# Patient Record
Sex: Female | Born: 1935
Health system: Southern US, Community
[De-identification: ages and names within clinical notes are randomized; demographics above are authoritative.]

## PROBLEM LIST (undated history)

## (undated) DIAGNOSIS — R112 Nausea with vomiting, unspecified: Secondary | ICD-10-CM

## (undated) DIAGNOSIS — Z5189 Encounter for other specified aftercare: Secondary | ICD-10-CM

## (undated) DIAGNOSIS — S42291P Other displaced fracture of upper end of right humerus, subsequent encounter for fracture with malunion: Secondary | ICD-10-CM

## (undated) DIAGNOSIS — M1990 Unspecified osteoarthritis, unspecified site: Secondary | ICD-10-CM

## (undated) DIAGNOSIS — R05 Cough: Secondary | ICD-10-CM

## (undated) DIAGNOSIS — Z9889 Other specified postprocedural states: Secondary | ICD-10-CM

## (undated) DIAGNOSIS — M8700 Idiopathic aseptic necrosis of unspecified bone: Secondary | ICD-10-CM

## (undated) DIAGNOSIS — E039 Hypothyroidism, unspecified: Secondary | ICD-10-CM

## (undated) DIAGNOSIS — T7840XA Allergy, unspecified, initial encounter: Secondary | ICD-10-CM

## (undated) DIAGNOSIS — R053 Chronic cough: Secondary | ICD-10-CM

## (undated) HISTORY — DX: Encounter for other specified aftercare: Z51.89

## (undated) HISTORY — PX: CATARACT EXTRACTION: SUR2

## (undated) HISTORY — DX: Cough: R05

## (undated) HISTORY — DX: Unspecified osteoarthritis, unspecified site: M19.90

## (undated) HISTORY — DX: Allergy, unspecified, initial encounter: T78.40XA

## (undated) HISTORY — PX: APPENDECTOMY: SHX54

## (undated) HISTORY — DX: Hypothyroidism, unspecified: E03.9

## (undated) HISTORY — PX: TONSILLECTOMY: SUR1361

## (undated) HISTORY — PX: LAPAROSCOPY: SHX197

## (undated) HISTORY — DX: Chronic cough: R05.3

---

## 1973-06-18 HISTORY — PX: ABDOMINAL HYSTERECTOMY: SHX81

## 1996-06-18 HISTORY — PX: OTHER SURGICAL HISTORY: SHX169

## 1999-05-09 ENCOUNTER — Encounter: Payer: Self-pay | Admitting: Obstetrics and Gynecology

## 1999-05-09 ENCOUNTER — Encounter: Admission: RE | Admit: 1999-05-09 | Discharge: 1999-05-09 | Payer: Self-pay | Admitting: Obstetrics and Gynecology

## 1999-08-21 ENCOUNTER — Other Ambulatory Visit: Admission: RE | Admit: 1999-08-21 | Discharge: 1999-08-21 | Payer: Self-pay | Admitting: Obstetrics and Gynecology

## 2000-05-29 ENCOUNTER — Encounter: Payer: Self-pay | Admitting: Internal Medicine

## 2000-05-29 ENCOUNTER — Encounter: Admission: RE | Admit: 2000-05-29 | Discharge: 2000-05-29 | Payer: Self-pay | Admitting: Internal Medicine

## 2001-05-05 ENCOUNTER — Other Ambulatory Visit: Admission: RE | Admit: 2001-05-05 | Discharge: 2001-05-05 | Payer: Self-pay | Admitting: Obstetrics and Gynecology

## 2001-07-30 ENCOUNTER — Encounter: Admission: RE | Admit: 2001-07-30 | Discharge: 2001-07-30 | Payer: Self-pay | Admitting: Internal Medicine

## 2001-07-30 ENCOUNTER — Encounter: Payer: Self-pay | Admitting: Internal Medicine

## 2002-11-10 ENCOUNTER — Encounter: Admission: RE | Admit: 2002-11-10 | Discharge: 2002-11-10 | Payer: Self-pay | Admitting: Unknown Physician Specialty

## 2002-11-10 ENCOUNTER — Encounter: Payer: Self-pay | Admitting: Unknown Physician Specialty

## 2004-05-03 ENCOUNTER — Encounter: Admission: RE | Admit: 2004-05-03 | Discharge: 2004-05-03 | Payer: Self-pay | Admitting: Internal Medicine

## 2004-06-05 ENCOUNTER — Ambulatory Visit: Payer: Self-pay | Admitting: Internal Medicine

## 2005-04-16 ENCOUNTER — Ambulatory Visit: Payer: Self-pay | Admitting: Internal Medicine

## 2005-04-17 ENCOUNTER — Ambulatory Visit: Payer: Self-pay | Admitting: Internal Medicine

## 2005-05-25 ENCOUNTER — Encounter: Admission: RE | Admit: 2005-05-25 | Discharge: 2005-05-25 | Payer: Self-pay | Admitting: Internal Medicine

## 2005-06-18 HISTORY — PX: OOPHORECTOMY: SHX86

## 2005-09-17 ENCOUNTER — Ambulatory Visit: Payer: Self-pay | Admitting: Internal Medicine

## 2006-04-15 ENCOUNTER — Ambulatory Visit: Payer: Self-pay | Admitting: Internal Medicine

## 2006-04-15 LAB — CONVERTED CEMR LAB
Albumin: 4.2 g/dL (ref 3.5–5.2)
BUN: 28 mg/dL — ABNORMAL HIGH (ref 6–23)
Basophils Absolute: 0 10*3/uL (ref 0.0–0.1)
CO2: 28 meq/L (ref 19–32)
Chloride: 106 meq/L (ref 96–112)
Cholesterol: 192 mg/dL (ref 0–200)
Creatinine, Ser: 1.1 mg/dL (ref 0.4–1.2)
Eosinophil percent: 6.9 % — ABNORMAL HIGH (ref 0.0–5.0)
GFR calc non Af Amer: 52 mL/min
Glomerular Filtration Rate, Af Am: 64 mL/min/{1.73_m2}
HCT: 36.2 % (ref 36.0–46.0)
Hemoglobin: 12.3 g/dL (ref 12.0–15.0)
Lymphocytes Relative: 28.9 % (ref 12.0–46.0)
MCHC: 33.9 g/dL (ref 30.0–36.0)
MCV: 93.8 fL (ref 78.0–100.0)
Monocytes Absolute: 0.6 10*3/uL (ref 0.2–0.7)
Neutrophils Relative %: 55.5 % (ref 43.0–77.0)
Sodium: 141 meq/L (ref 135–145)
Total Bilirubin: 0.6 mg/dL (ref 0.3–1.2)
WBC: 6.4 10*3/uL (ref 4.5–10.5)

## 2006-05-16 ENCOUNTER — Ambulatory Visit: Payer: Self-pay | Admitting: Internal Medicine

## 2006-05-18 ENCOUNTER — Encounter: Payer: Self-pay | Admitting: Internal Medicine

## 2006-05-18 LAB — CONVERTED CEMR LAB

## 2006-05-27 ENCOUNTER — Encounter: Admission: RE | Admit: 2006-05-27 | Discharge: 2006-05-27 | Payer: Self-pay | Admitting: Internal Medicine

## 2006-06-18 LAB — HM COLONOSCOPY

## 2006-06-21 ENCOUNTER — Ambulatory Visit: Payer: Self-pay | Admitting: Internal Medicine

## 2006-09-11 ENCOUNTER — Encounter (INDEPENDENT_AMBULATORY_CARE_PROVIDER_SITE_OTHER): Payer: Self-pay | Admitting: *Deleted

## 2006-09-11 ENCOUNTER — Ambulatory Visit (HOSPITAL_COMMUNITY): Admission: RE | Admit: 2006-09-11 | Discharge: 2006-09-11 | Payer: Self-pay | Admitting: Obstetrics and Gynecology

## 2007-01-03 ENCOUNTER — Ambulatory Visit: Payer: Self-pay | Admitting: Internal Medicine

## 2007-01-09 ENCOUNTER — Telehealth (INDEPENDENT_AMBULATORY_CARE_PROVIDER_SITE_OTHER): Payer: Self-pay | Admitting: *Deleted

## 2007-01-13 ENCOUNTER — Encounter: Payer: Self-pay | Admitting: Internal Medicine

## 2007-01-13 ENCOUNTER — Ambulatory Visit: Payer: Self-pay | Admitting: Internal Medicine

## 2007-03-13 ENCOUNTER — Encounter: Payer: Self-pay | Admitting: Internal Medicine

## 2007-03-13 DIAGNOSIS — J309 Allergic rhinitis, unspecified: Secondary | ICD-10-CM | POA: Insufficient documentation

## 2007-03-13 DIAGNOSIS — M810 Age-related osteoporosis without current pathological fracture: Secondary | ICD-10-CM | POA: Insufficient documentation

## 2007-03-13 DIAGNOSIS — E039 Hypothyroidism, unspecified: Secondary | ICD-10-CM

## 2007-04-17 ENCOUNTER — Ambulatory Visit: Payer: Self-pay | Admitting: Internal Medicine

## 2007-04-17 DIAGNOSIS — E785 Hyperlipidemia, unspecified: Secondary | ICD-10-CM

## 2007-04-17 DIAGNOSIS — T887XXA Unspecified adverse effect of drug or medicament, initial encounter: Secondary | ICD-10-CM

## 2007-04-17 LAB — CONVERTED CEMR LAB
AST: 16 units/L (ref 0–37)
Albumin: 4.2 g/dL (ref 3.5–5.2)
BUN: 17 mg/dL (ref 6–23)
Basophils Absolute: 0 10*3/uL (ref 0.0–0.1)
Bilirubin, Direct: 0.2 mg/dL (ref 0.0–0.3)
CO2: 30 meq/L (ref 19–32)
Calcium: 9.5 mg/dL (ref 8.4–10.5)
Chloride: 108 meq/L (ref 96–112)
Cholesterol: 181 mg/dL (ref 0–200)
Creatinine, Ser: 0.9 mg/dL (ref 0.4–1.2)
Eosinophils Absolute: 0.3 10*3/uL (ref 0.0–0.6)
GFR calc non Af Amer: 66 mL/min
HCT: 34.8 % — ABNORMAL LOW (ref 36.0–46.0)
HDL: 56.2 mg/dL (ref >39.0)
Hemoglobin: 12.2 g/dL (ref 12.0–15.0)
LDL Cholesterol: 111 mg/dL — ABNORMAL HIGH (ref 0–99)
MCHC: 34.9 g/dL (ref 30.0–36.0)
MCV: 91.5 fL (ref 78.0–100.0)
Monocytes Absolute: 0.5 10*3/uL (ref 0.2–0.7)
Monocytes Relative: 9.2 % (ref 3.0–11.0)
Neutro Abs: 2.7 10*3/uL (ref 1.4–7.7)
Neutrophils Relative %: 49.5 % (ref 43.0–77.0)
Phosphorus: 4 mg/dL (ref 2.3–4.6)
RDW: 13 % (ref 11.5–14.6)
TSH: 1.01 microintl units/mL (ref 0.35–5.50)

## 2007-06-25 ENCOUNTER — Encounter: Payer: Self-pay | Admitting: Internal Medicine

## 2007-06-25 ENCOUNTER — Encounter: Admission: RE | Admit: 2007-06-25 | Discharge: 2007-06-25 | Payer: Self-pay | Admitting: Internal Medicine

## 2007-07-07 ENCOUNTER — Ambulatory Visit: Payer: Self-pay | Admitting: Family Medicine

## 2007-07-07 ENCOUNTER — Encounter: Payer: Self-pay | Admitting: Internal Medicine

## 2007-07-14 ENCOUNTER — Ambulatory Visit: Payer: Self-pay | Admitting: Internal Medicine

## 2007-07-14 DIAGNOSIS — IMO0002 Reserved for concepts with insufficient information to code with codable children: Secondary | ICD-10-CM

## 2007-07-16 ENCOUNTER — Encounter: Admission: RE | Admit: 2007-07-16 | Discharge: 2007-07-16 | Payer: Self-pay | Admitting: Internal Medicine

## 2007-07-22 ENCOUNTER — Encounter: Payer: Self-pay | Admitting: Internal Medicine

## 2007-07-23 ENCOUNTER — Telehealth: Payer: Self-pay | Admitting: *Deleted

## 2007-08-05 ENCOUNTER — Encounter: Payer: Self-pay | Admitting: Internal Medicine

## 2007-10-08 ENCOUNTER — Ambulatory Visit: Payer: Self-pay | Admitting: Internal Medicine

## 2007-10-08 LAB — CONVERTED CEMR LAB: TSH: 0.13 microintl units/mL — ABNORMAL LOW (ref 0.35–5.50)

## 2007-10-20 ENCOUNTER — Ambulatory Visit: Payer: Self-pay | Admitting: Internal Medicine

## 2007-10-20 LAB — CONVERTED CEMR LAB
Cholesterol, target level: 200 mg/dL
HDL goal, serum: 40 mg/dL
LDL Goal: 160 mg/dL

## 2008-04-14 ENCOUNTER — Ambulatory Visit: Payer: Self-pay | Admitting: Internal Medicine

## 2008-04-14 DIAGNOSIS — G4700 Insomnia, unspecified: Secondary | ICD-10-CM

## 2008-04-14 LAB — CONVERTED CEMR LAB
ALT: 12 units/L (ref 0–35)
Albumin: 4 g/dL (ref 3.5–5.2)
Alkaline Phosphatase: 35 units/L — ABNORMAL LOW (ref 39–117)
Basophils Relative: 0.9 % (ref 0.0–3.0)
Bilirubin, Direct: 0.1 mg/dL (ref 0.0–0.3)
Eosinophils Relative: 7.4 % — ABNORMAL HIGH (ref 0.0–5.0)
HCT: 33.4 % — ABNORMAL LOW (ref 36.0–46.0)
HDL: 59.3 mg/dL (ref >39.0)
MCV: 93.5 fL (ref 78.0–100.0)
Monocytes Absolute: 0.5 10*3/uL (ref 0.1–1.0)
Monocytes Relative: 9.1 % (ref 3.0–12.0)
Platelets: 206 10*3/uL (ref 150–400)
RBC: 3.57 M/uL — ABNORMAL LOW (ref 3.87–5.11)
Total CHOL/HDL Ratio: 3.1
Total Protein: 7 g/dL (ref 6.0–8.3)
WBC: 5.7 10*3/uL (ref 4.5–10.5)

## 2008-04-27 ENCOUNTER — Ambulatory Visit: Payer: Self-pay | Admitting: Internal Medicine

## 2008-07-08 ENCOUNTER — Encounter: Admission: RE | Admit: 2008-07-08 | Discharge: 2008-07-08 | Payer: Self-pay | Admitting: Internal Medicine

## 2008-08-11 ENCOUNTER — Telehealth: Payer: Self-pay | Admitting: Internal Medicine

## 2008-11-01 ENCOUNTER — Telehealth: Payer: Self-pay | Admitting: *Deleted

## 2009-07-27 ENCOUNTER — Encounter: Admission: RE | Admit: 2009-07-27 | Discharge: 2009-07-27 | Payer: Self-pay | Admitting: Internal Medicine

## 2009-07-27 LAB — HM MAMMOGRAPHY

## 2009-10-06 ENCOUNTER — Telehealth: Payer: Self-pay | Admitting: Internal Medicine

## 2009-10-26 ENCOUNTER — Ambulatory Visit: Payer: Self-pay | Admitting: Family Medicine

## 2009-10-26 ENCOUNTER — Encounter: Payer: Self-pay | Admitting: Internal Medicine

## 2009-12-26 ENCOUNTER — Ambulatory Visit: Payer: Self-pay | Admitting: Internal Medicine

## 2009-12-26 LAB — CONVERTED CEMR LAB
ALT: 10 units/L (ref 0–35)
AST: 17 units/L (ref 0–37)
Basophils Relative: 0.8 % (ref 0.0–3.0)
Eosinophils Relative: 7.1 % — ABNORMAL HIGH (ref 0.0–5.0)
GFR calc non Af Amer: 61.43 mL/min (ref >60)
HCT: 34.4 % — ABNORMAL LOW (ref 36.0–46.0)
Hemoglobin: 11.8 g/dL — ABNORMAL LOW (ref 12.0–15.0)
LDL Cholesterol: 125 mg/dL — ABNORMAL HIGH (ref 0–99)
Lymphs Abs: 1.7 10*3/uL (ref 0.7–4.0)
Monocytes Relative: 4.8 % (ref 3.0–12.0)
Neutro Abs: 2.7 10*3/uL (ref 1.4–7.7)
Platelets: 232 10*3/uL (ref 150.0–400.0)
Potassium: 5.6 meq/L — ABNORMAL HIGH (ref 3.5–5.1)
Protein, U semiquant: NEGATIVE
RBC: 3.64 M/uL — ABNORMAL LOW (ref 3.87–5.11)
Sodium: 144 meq/L (ref 135–145)
TSH: 1.52 microintl units/mL (ref 0.35–5.50)
Total Bilirubin: 0.7 mg/dL (ref 0.3–1.2)
Total CHOL/HDL Ratio: 3
Urobilinogen, UA: 0.2
VLDL: 13 mg/dL (ref 0.0–40.0)
WBC: 5 10*3/uL (ref 4.5–10.5)

## 2010-01-02 ENCOUNTER — Ambulatory Visit: Payer: Self-pay | Admitting: Internal Medicine

## 2010-04-06 ENCOUNTER — Ambulatory Visit: Payer: Self-pay | Admitting: Internal Medicine

## 2010-07-03 ENCOUNTER — Encounter: Payer: Self-pay | Admitting: Internal Medicine

## 2010-07-03 ENCOUNTER — Other Ambulatory Visit: Payer: Self-pay | Admitting: Internal Medicine

## 2010-07-03 ENCOUNTER — Ambulatory Visit
Admission: RE | Admit: 2010-07-03 | Discharge: 2010-07-03 | Payer: Self-pay | Source: Home / Self Care | Attending: Internal Medicine | Admitting: Internal Medicine

## 2010-07-03 LAB — LIPID PANEL
Cholesterol: 191 mg/dL (ref 0–200)
HDL: 60.8 mg/dL (ref >39.00)
LDL Cholesterol: 116 mg/dL — ABNORMAL HIGH (ref 0–99)
Total CHOL/HDL Ratio: 3
Triglycerides: 73 mg/dL (ref 0.0–149.0)
VLDL: 14.6 mg/dL (ref 0.0–40.0)

## 2010-07-03 LAB — T3, FREE: T3, Free: 2.2 pg/mL — ABNORMAL LOW (ref 2.3–4.2)

## 2010-07-03 LAB — TSH: TSH: 0.71 u[IU]/mL (ref 0.35–5.50)

## 2010-07-03 LAB — T4, FREE: Free T4: 0.88 ng/dL (ref 0.60–1.60)

## 2010-07-18 NOTE — Miscellaneous (Signed)
Summary: BONE DENSITY  Clinical Lists Changes  Orders: Added new Test order of T-Bone Densitometry (77080) - Signed Added new Test order of T-Lumbar Vertebral Assessment (77082) - Signed 

## 2010-07-18 NOTE — Assessment & Plan Note (Signed)
Summary: flu shot/jss  Nurse Visit   Allergies: 1)  ! Sulfamethoxazole (Sulfamethoxazole)  Orders Added: 1)  Flu Vaccine 68yrs + MEDICARE PATIENTS [Q2039] 2)  Administration Flu vaccine - MCR [G0008]      Flu Vaccine Consent Questions     Do you have a history of severe allergic reactions to this vaccine? no    Any prior history of allergic reactions to egg and/or gelatin? no    Do you have a sensitivity to the preservative Thimersol? no    Do you have a past history of Guillan-Barre Syndrome? no    Do you currently have an acute febrile illness? no    Have you ever had a severe reaction to latex? no    Vaccine information given and explained to patient? yes    Are you currently pregnant? no    Lot Number:[id number]   Exp Date:12/16/2010   Site Given  Left Deltoid IM1

## 2010-07-18 NOTE — Assessment & Plan Note (Signed)
Summary: CPX // RS   Vital Signs:  Patient profile:   75 year old female Height:      66 inches Weight:      149 pounds BMI:     24.14 Temp:     98.2 degrees F oral Pulse rate:   72 / minute Resp:     14 per minute BP sitting:   120 / 70  (left arm)  Vitals Entered By: Willy Eddy, LPN (January 02, 2010 11:04 AM) CC: annual visit for disease managment-bone density results   CC:  annual visit for disease managment-bone density results.  Preventive Screening-Counseling & Management  Alcohol-Tobacco     Smoking Status: never     Passive Smoke Exposure: no  Problems Prior to Update: 1)  Insomnia, Chronic  (ICD-307.42) 2)  Thoracic/lumbosacral Neuritis/radiculitis Unspec  (ICD-724.4) 3)  Back Pain With Radiculopathy  (ICD-729.2) 4)  Advef, Drug/medicinal/biological Subst Nos  (ICD-995.20) 5)  Hyperlipidemia  (ICD-272.4) 6)  Preventive Health Care  (ICD-V70.0) 7)  Family History of Colon Ca 1st Degree Relative <60  (ICD-V16.0) 8)  Allergic Rhinitis  (ICD-477.9) 9)  Osteoporosis  (ICD-733.00) 10)  Hypothyroidism  (ICD-244.9)  Current Problems (verified): 1)  Insomnia, Chronic  (ICD-307.42) 2)  Thoracic/lumbosacral Neuritis/radiculitis Unspec  (ICD-724.4) 3)  Back Pain With Radiculopathy  (ICD-729.2) 4)  Advef, Drug/medicinal/biological Subst Nos  (ICD-995.20) 5)  Hyperlipidemia  (ICD-272.4) 6)  Preventive Health Care  (ICD-V70.0) 7)  Family History of Colon Ca 1st Degree Relative <60  (ICD-V16.0) 8)  Allergic Rhinitis  (ICD-477.9) 9)  Osteoporosis  (ICD-733.00) 10)  Hypothyroidism  (ICD-244.9)  Medications Prior to Update: 1)  Synthroid 75 Mcg  Tabs (Levothyroxine Sodium) .... Take 1 Tablet By Mouth Once A Day 2)  Multivitamins   Tabs (Multiple Vitamin) .... Once Daily 3)  Calcium-Vitamin D 500-125 Mg-Unit  Tabs (Calcium-Vitamin D) .... Once Daily 4)  Boniva 150 Mg  Tabs (Ibandronate Sodium) .... Monthly 5)  Lunesta 3 Mg  Tabs (Eszopiclone) .... Bedtime As  Needed 6)  Seroquel 25 Mg Tabs (Quetiapine Fumarate) .... One By Mouth Q Hs  Current Medications (verified): 1)  Synthroid 75 Mcg  Tabs (Levothyroxine Sodium) .... Take 1 Tablet By Mouth Once A Day 2)  Multivitamins   Tabs (Multiple Vitamin) .... Once Daily 3)  Calcarb 600/d 600-400 Mg-Unit Tabs (Calcium Carbonate-Vitamin D) .... One By Mouth Two Times A Day One Two At Bed Time 4)  Zolpidem Tartrate 12.5 Mg Cr-Tabs (Zolpidem Tartrate) .... One By Mouth Daily 5)  Vitamin D 1000 Unit Tabs (Cholecalciferol) .... One At Bedtime With The Calcium  Allergies (verified): 1)  ! Sulfamethoxazole (Sulfamethoxazole)  Past History:  Family History: Last updated: 2007/05/07 father died from age at 83 ( parkinsons) mother had alzhiemers Family History Osteoporosis Family History of Colon CA 1st degree relative <60  Social History: Last updated: 07-May-2007 Married Never Smoked Alcohol use-no Drug use-no Regular exercise-yes  Risk Factors: Caffeine Use: 1 (May 07, 2007) Exercise: yes (May 07, 2007)  Risk Factors: Smoking Status: never (01/02/2010) Passive Smoke Exposure: no (01/02/2010)  Past medical, surgical, family and social histories (including risk factors) reviewed, and no changes noted (except as noted below).  Past Medical History: Reviewed history from 05-07-07 and no changes required. Hypothyroidism Osteoporosis Allergic rhinitis Hyperlipidemia  Past Surgical History: Reviewed history from 05-07-2007 and no changes required. Hysterectomy, TAH- laparscopy with  Oophorectomy due to ovarain cysts Appendectomy Tonsillectomy  Family History: Reviewed history from 05/07/2007 and no changes required. father died from age at  80 ( parkinsons) mother had alzhiemers Family History Osteoporosis Family History of Colon CA 1st degree relative <60  Social History: Reviewed history from 04/17/2007 and no changes required. Married Never Smoked Alcohol use-no Drug  use-no Regular exercise-yes  Review of Systems  The patient denies anorexia, fever, weight loss, weight gain, vision loss, decreased hearing, hoarseness, chest pain, syncope, dyspnea on exertion, peripheral edema, prolonged cough, headaches, hemoptysis, abdominal pain, melena, hematochezia, severe indigestion/heartburn, hematuria, incontinence, genital sores, muscle weakness, suspicious skin lesions, transient blindness, difficulty walking, depression, unusual weight change, abnormal bleeding, enlarged lymph nodes, angioedema, and breast masses.    Physical Exam  General:  Well-developed,well-nourished,in no acute distress; alert,appropriate and cooperative throughout examination Head:  Normocephalic and atraumatic without obvious abnormalities. No apparent alopecia or balding. Eyes:  No corneal or conjunctival inflammation noted. EOMI. Perrla. Funduscopic exam benign, without hemorrhages, exudates or papilledema. Vision grossly normal. Nose:  External nasal examination shows no deformity or inflammation. Nasal mucosa are pink and moist without lesions or exudates. Mouth:  Oral mucosa and oropharynx without lesions or exudates.  Teeth in good repair. Neck:  No deformities, masses, or tenderness noted. Lungs:  Normal respiratory effort, chest expands symmetrically. Lungs are clear to auscultation, no crackles or wheezes. Heart:  normal rate, regular rhythm, and no gallop.     Impression & Recommendations:  Problem # 1:  OSTEOPOROSIS (ICD-733.00) hold the boniva The following medications were removed from the medication list:    Boniva 150 Mg Tabs (Ibandronate sodium) ..... Monthly  Discussed medication use, applications of heat or ice, and exercises.   Problem # 2:  PREVENTIVE HEALTH CARE (ICD-V70.0) one by mouth BID Mammogram: ASSESSMENT: Negative - BI-RADS 1^MM DIGITAL SCREENING (07/27/2009) Pap smear: normal (07/15/2009) Colonoscopy: done (06/18/2006) Bone Density: abnormal  (10/26/2009) Td Booster: historical (06/18/2005)   Flu Vax: Fluvax Non-MCR (04/17/2007)   Pneumovax: Pneumovax (Medicare) (04/14/2008) Chol: 193 (12/26/2009)   HDL: 55.40 (12/26/2009)   LDL: 125 (12/26/2009)   TG: 65.0 (12/26/2009) TSH: 1.52 (12/26/2009)   Next mammogram due:: 07/2010 (01/02/2010)  Discussed using sunscreen, use of alcohol, drug use, self breast exam, routine dental care, routine eye care, schedule for GYN exam, routine physical exam, seat belts, multiple vitamins, osteoporosis prevention, adequate calcium intake in diet, recommendations for immunizations, mammograms and Pap smears.  Discussed exercise and checking cholesterol.  Discussed gun safety, safe sex, and contraception.  Problem # 3:  HYPOTHYROIDISM (ICD-244.9)  Her updated medication list for this problem includes:    Synthroid 75 Mcg Tabs (Levothyroxine sodium) .Marland Kitchen... Take 1 tablet by mouth once a day  Labs Reviewed: TSH: 1.52 (12/26/2009)    Chol: 193 (12/26/2009)   HDL: 55.40 (12/26/2009)   LDL: 125 (12/26/2009)   TG: 65.0 (12/26/2009)  Problem # 4:  INSOMNIA, CHRONIC (ICD-307.42) the seroquil did not help  Complete Medication List: 1)  Synthroid 75 Mcg Tabs (Levothyroxine sodium) .... Take 1 tablet by mouth once a day 2)  Multivitamins Tabs (Multiple vitamin) .... Once daily 3)  Calcarb 600/d 600-400 Mg-unit Tabs (Calcium carbonate-vitamin d) .... One by mouth two times a day one two at bed time 4)  Zolpidem Tartrate 12.5 Mg Cr-tabs (Zolpidem tartrate) .... One by mouth daily 5)  Vitamin D 1000 Unit Tabs (Cholecalciferol) .... One at bedtime with the calcium  Other Orders: Venipuncture (16109) TLB-Potassium (K+) (60454-U)  Patient Instructions: 1)  Please schedule a follow-up appointment in 6 months. Prescriptions: ZOLPIDEM TARTRATE 12.5 MG CR-TABS (ZOLPIDEM TARTRATE) one by mouth daily  #30 x 2  Entered and Authorized by:   Stacie Glaze MD   Signed by:   Stacie Glaze MD on 01/02/2010   Method  used:   Print then Give to Patient   RxID:   [id number] SEROQUEL 25 MG TABS (QUETIAPINE FUMARATE) one by mouth q HS  #90 x 3   Entered by:   Willy Eddy, LPN   Authorized by:   Stacie Glaze MD   Signed by:   Willy Eddy, LPN on 14/78/2956   Method used:   Print then Give to Patient   RxID:   [id number] LUNESTA 3 MG  TABS (ESZOPICLONE) bedtime as needed  #90 x 3   Entered by:   Willy Eddy, LPN   Authorized by:   Stacie Glaze MD   Signed by:   Willy Eddy, LPN on 28/41/3244   Method used:   Print then Give to Patient   RxID:   [id number] SYNTHROID 75 MCG  TABS (LEVOTHYROXINE SODIUM) Take 1 tablet by mouth once a day  #90 x 3   Entered by:   Willy Eddy, LPN   Authorized by:   Stacie Glaze MD   Signed by:   Willy Eddy, LPN on 42/59/5638   Method used:   Print then Give to Patient   RxID:   [id number]    Preventive Care Screening  Mammogram:    Date:  07/15/2009    Next Due:  07/2010    Results:  normal   Pap Smear:    Date:  07/15/2009    Next Due:  07/2010    Results:  normal   Bone Density:    Date:  10/26/2009    Results:  abnormal std dev    Immunization History:  Tetanus/Td Immunization History:    Tetanus/Td:  historical (03/18/2009)   Prevention & Chronic Care Immunizations   Influenza vaccine: Fluvax Non-MCR  (04/17/2007)   Influenza vaccine due: 02/17/2011    Tetanus booster: 03/18/2009: Historical   Tetanus booster due: 06/19/2015    Pneumococcal vaccine: Pneumovax (Medicare)  (04/14/2008)   Pneumococcal vaccine due: 04/14/2013    H. zoster vaccine: 04/27/2008: Zostavax  Colorectal Screening   Hemoccult: Not documented    Colonoscopy: done  (06/18/2006)  Other Screening   Pap smear: normal  (07/15/2009)   Pap smear due: 07/2010    Mammogram: ASSESSMENT: Negative - BI-RADS 1^MM DIGITAL SCREENING  (07/27/2009)   Mammogram due: 07/2010    DXA bone density scan: abnormal   (10/26/2009)   Smoking status: never  (01/02/2010)  Lipids   Total Cholesterol: 193  (12/26/2009)   LDL: 125  (12/26/2009)   LDL Direct: Not documented   HDL: 55.40  (12/26/2009)   Triglycerides: 65.0  (12/26/2009)    SGOT (AST): 17  (12/26/2009)   SGPT (ALT): 10  (12/26/2009)   Alkaline phosphatase: 38  (12/26/2009)   Total bilirubin: 0.7  (12/26/2009)  Self-Management Support :    Lipid self-management support: Not documented

## 2010-07-18 NOTE — Progress Notes (Signed)
Summary: REQ FOR MED RX (SYNTHROID)  Phone Note Refill Request Message from:  Patient on October 06, 2009 10:17 AM  Refills Requested: Medication #1:  SYNTHROID 75 MCG  TABS Take 1 tablet by mouth once a day   Notes: Pt adv that Rx can be sent to [facility] (for a year, 90-day increments). Initial call taken by: Debbra Riding,  October 06, 2009 10:18 AM

## 2010-07-20 ENCOUNTER — Other Ambulatory Visit: Payer: Self-pay | Admitting: Internal Medicine

## 2010-07-20 DIAGNOSIS — Z1239 Encounter for other screening for malignant neoplasm of breast: Secondary | ICD-10-CM

## 2010-07-20 NOTE — Assessment & Plan Note (Signed)
Summary: 6 monrth rov/njr   Vital Signs:  Patient profile:   75 year old female Weight:      146 pounds BMI:     23.65 Temp:     98.5 degrees F oral Pulse rate:   70 / minute Resp:     14 per minute BP sitting:   120 / 80  (left arm) Cuff size:   regular  Vitals Entered By: Azucena Freed,  MA Student CC: 6 mon rov---doing ok , Lipid Management Is Patient Diabetic? No   Primary Care Provider:  Stacie Glaze MD  CC:  6 mon rov---doing ok  and Lipid Management.  History of Present Illness: follow up weight stable the pt's husband has dementia and the care issues are becoming burdensome   Lipid Management History:      Positive NCEP/ATP III risk factors include female age 87 years old or older.  Negative NCEP/ATP III risk factors include no family history for ischemic heart disease, non-tobacco-user status, non-hypertensive, no ASHD (atherosclerotic heart disease), no prior stroke/TIA, no peripheral vascular disease, and no history of aortic aneurysm.     Problems Prior to Update: 1)  Insomnia, Chronic  (ICD-307.42) 2)  Thoracic/lumbosacral Neuritis/radiculitis Unspec  (ICD-724.4) 3)  Back Pain With Radiculopathy  (ICD-729.2) 4)  Advef, Drug/medicinal/biological Subst Nos  (ICD-995.20) 5)  Hyperlipidemia  (ICD-272.4) 6)  Preventive Health Care  (ICD-V70.0) 7)  Family History of Colon Ca 1st Degree Relative <60  (ICD-V16.0) 8)  Allergic Rhinitis  (ICD-477.9) 9)  Osteoporosis  (ICD-733.00) 10)  Hypothyroidism  (ICD-244.9)  Medications Prior to Update: 1)  Synthroid 75 Mcg  Tabs (Levothyroxine Sodium) .... Take 1 Tablet By Mouth Once A Day 2)  Multivitamins   Tabs (Multiple Vitamin) .... Once Daily 3)  Calcarb 600/d 600-400 Mg-Unit Tabs (Calcium Carbonate-Vitamin D) .... One By Mouth Two Times A Day One Two At Bed Time 4)  Zolpidem Tartrate 10 Mg Tabs (Zolpidem Tartrate) .Marland Kitchen.. 1 Once Daily 5)  Vitamin D 1000 Unit Tabs (Cholecalciferol) .... One At Bedtime With The  Calcium  Current Medications (verified): 1)  Synthroid 75 Mcg  Tabs (Levothyroxine Sodium) .... Take 1 Tablet By Mouth Once A Day 2)  Multivitamins   Tabs (Multiple Vitamin) .... Once Daily 3)  Calcarb 600/d 600-400 Mg-Unit Tabs (Calcium Carbonate-Vitamin D) .... One By Mouth Two Times A Day One Two At Bed Time 4)  Lunesta 3 Mg Tabs (Eszopiclone) .Marland Kitchen.. 1 Qh As As Needed Sleep 5)  Vitamin D 1000 Unit Tabs (Cholecalciferol) .... One At Bedtime With The Calcium  Allergies (verified): 1)  ! Sulfamethoxazole (Sulfamethoxazole)  Past History:  Family History: Last updated: May 12, 2007 father died from age at 68 ( parkinsons) mother had alzhiemers Family History Osteoporosis Family History of Colon CA 1st degree relative <60  Social History: Last updated: 05-12-2007 Married Never Smoked Alcohol use-no Drug use-no Regular exercise-yes  Risk Factors: Caffeine Use: 1 (May 12, 2007) Exercise: yes (May 12, 2007)  Risk Factors: Smoking Status: never (01/02/2010) Passive Smoke Exposure: no (01/02/2010)  Past medical, surgical, family and social histories (including risk factors) reviewed, and no changes noted (except as noted below).  Past Medical History: Reviewed history from 12-May-2007 and no changes required. Hypothyroidism Osteoporosis Allergic rhinitis Hyperlipidemia  Past Surgical History: Reviewed history from 2007/05/12 and no changes required. Hysterectomy, TAH- laparscopy with  Oophorectomy due to ovarain cysts Appendectomy Tonsillectomy  Family History: Reviewed history from 05/12/2007 and no changes required. father died from age at 75 ( parkinsons) mother had  alzhiemers Family History Osteoporosis Family History of Colon CA 1st degree relative <60  Social History: Reviewed history from 04/17/2007 and no changes required. Married Never Smoked Alcohol use-no Drug use-no Regular exercise-yes  Review of Systems  The patient denies anorexia, fever, weight  loss, weight gain, vision loss, decreased hearing, hoarseness, chest pain, syncope, dyspnea on exertion, peripheral edema, prolonged cough, headaches, hemoptysis, abdominal pain, melena, hematochezia, severe indigestion/heartburn, hematuria, incontinence, genital sores, muscle weakness, suspicious skin lesions, transient blindness, difficulty walking, depression, unusual weight change, abnormal bleeding, enlarged lymph nodes, angioedema, and breast masses.    Physical Exam  General:  Well-developed,well-nourished,in no acute distress; alert,appropriate and cooperative throughout examination Head:  Normocephalic and atraumatic without obvious abnormalities. No apparent alopecia or balding. Eyes:  pupils equal and pupils round.   Ears:  R ear normal and L ear normal.   Nose:  no external deformity and no nasal discharge.   Neck:  No deformities, masses, or tenderness noted. Lungs:  normal respiratory effort and no wheezes.   Heart:  normal rate and regular rhythm.   Abdomen:  soft, non-tender, and normal bowel sounds.      Impression & Recommendations:  Problem # 1:  HYPERLIPIDEMIA (ICD-272.4)  Labs Reviewed: SGOT: 17 (12/26/2009)   SGPT: 10 (12/26/2009)  Lipid Goals: Chol Goal: 200 (10/20/2007)   HDL Goal: 40 (10/20/2007)   LDL Goal: 160 (10/20/2007)   TG Goal: 150 (10/20/2007)  Prior 10 Yr Risk Heart Disease: 8 % (10/20/2007)   HDL:55.40 (12/26/2009), 59.3 (04/14/2008)  LDL:125 (12/26/2009), 109 (04/14/2008)  Chol:193 (12/26/2009), 181 (04/14/2008)  Trig:65.0 (12/26/2009), 66 (04/14/2008)  Orders: TLB-Lipid Panel (80061-LIPID)  Problem # 2:  HYPOTHYROIDISM (ICD-244.9) Assessment: New  Her updated medication list for this problem includes:    Synthroid 75 Mcg Tabs (Levothyroxine sodium) .Marland Kitchen... Take 1 tablet by mouth once a day  Labs Reviewed: TSH: 1.52 (12/26/2009)    Chol: 193 (12/26/2009)   HDL: 55.40 (12/26/2009)   LDL: 125 (12/26/2009)   TG: 65.0  (12/26/2009)  Orders: TLB-T3, Free (Triiodothyronine) (84481-T3FREE) TLB-T4 (Thyrox), Free [phone]) TLB-TSH (Thyroid Stimulating Hormone) (84443-TSH)  Problem # 3:  OSTEOPOROSIS (ICD-733.00) Assessment: Unchanged  Discussed medication use, applications of heat or ice, and exercises.   Orders: Venipuncture (40981) T-Vitamin D (25-Hydroxy) (19147-82956)  Problem # 4:  THORACIC/LUMBOSACRAL NEURITIS/RADICULITIS UNSPEC (ICD-724.4) Assessment: Unchanged  Discussed use of moist heat or ice, modified activities, medications, and stretching/strengthening exercises. Back care instructions given. To be seen in 2 weeks if no improvement; sooner if worsening of symptoms.   Complete Medication List: 1)  Synthroid 75 Mcg Tabs (Levothyroxine sodium) .... Take 1 tablet by mouth once a day 2)  Multivitamins Tabs (Multiple vitamin) .... Once daily 3)  Calcarb 600/d 600-400 Mg-unit Tabs (Calcium carbonate-vitamin d) .... One by mouth two times a day one two at bed time 4)  Lunesta 3 Mg Tabs (Eszopiclone) .Marland Kitchen.. 1 qh as as needed sleep 5)  Vitamin D 1000 Unit Tabs (Cholecalciferol) .... One at bedtime with the calcium  Lipid Assessment/Plan:      Based on NCEP/ATP III, the patient's risk factor category is "0-1 risk factors".  The patient's lipid goals are as follows: Total cholesterol goal is 200; LDL cholesterol goal is 160; HDL cholesterol goal is 40; Triglyceride goal is 150.  Her LDL cholesterol goal has been met.    Patient Instructions: 1)  Please schedule a follow-up appointment in 6 months. medicare welness exam Prescriptions: LUNESTA 3 MG TABS (ESZOPICLONE) 1 qh as as needed sleep  #  90 x 3   Entered and Authorized by:   Stacie Glaze MD   Signed by:   Stacie Glaze MD on 07/03/2010   Method used:   Print then Give to Patient   RxID:   [id number] SYNTHROID 75 MCG  TABS (LEVOTHYROXINE SODIUM) Take 1 tablet by mouth once a day  #90 x 3   Entered and Authorized by:   Stacie Glaze  MD   Signed by:   Stacie Glaze MD on 07/03/2010   Method used:   Electronically to        [facility]* (mail-order)       [address] [place], [place]  [address]       Ph: [phone]       Fax: [phone]   RxID:   [id number] LUNESTA 3 MG TABS (ESZOPICLONE) 1 qh as as needed sleep  #30 x 11   Entered and Authorized by:   Stacie Glaze MD   Signed by:   Stacie Glaze MD on 07/03/2010   Method used:   Print then Give to Patient   RxID:   [id number]    Orders Added: 1)  Est. Patient Level IV [95188] 2)  Venipuncture [41660] 3)  T-Vitamin D (25-Hydroxy) [63016-01093] 4)  TLB-T3, Free (Triiodothyronine) [23557-D2KGUR] 5)  TLB-T4 (Thyrox), Free [42706-CB7S] 6)  TLB-TSH (Thyroid Stimulating Hormone) [84443-TSH] 7)  TLB-Lipid Panel [80061-LIPID]  Appended Document: Orders Update    Clinical Lists Changes  Orders: Added new Service order of Specimen Handling (28315) - Signed

## 2010-08-02 ENCOUNTER — Ambulatory Visit
Admission: RE | Admit: 2010-08-02 | Discharge: 2010-08-02 | Disposition: A | Discharge status: Home / Self Care | Payer: PRIVATE HEALTH INSURANCE | Source: Ambulatory Visit | Attending: Internal Medicine | Admitting: Internal Medicine

## 2010-08-02 DIAGNOSIS — Z1239 Encounter for other screening for malignant neoplasm of breast: Secondary | ICD-10-CM

## 2010-11-03 NOTE — Op Note (Signed)
Nancy, [name] NO.:  [id number]   MEDICAL RECORD NO.:  [id number]          PATIENT TYPE:  AMB   LOCATION:  SDC                           FACILITY:  [facility]   PHYSICIAN:  Carrington Clamp, M.D. DATE OF BIRTH:  December 08, 1935   DATE OF PROCEDURE:  09/11/2006  DATE OF DISCHARGE:                               OPERATIVE REPORT   PREOPERATIVE DIAGNOSIS:  Right ovarian cyst and hydrosalpinx.  The  patient desires ovaries out.   POSTOPERATIVE DIAGNOSIS:  Right ovarian cyst and hydrosalpinx.  The  patient desires ovaries out.  Adhesions of left ovary to the sigmoid  bowel.   SURGEON:  Carrington Clamp, M.D.   ASSISTANT:  Luvenia Redden, M.D.   ANESTHESIA:  General endotracheal.   SPECIMENS:  Right and left ovary and tube.   ESTIMATED BLOOD LOSS:  Minimal.   IV FLUIDS:  1700 mL.   URINE OUTPUT:  Was not measured.   COMPLICATIONS:  Were none.   FINDINGS:  Were small clear cyst on the right ovary.  There also  appeared to be 2 cm cyst inside the ovary, too, this was not inspected  as the entire ovary was handed off intact.  Left ovary appeared normal  except there was adhesed to the sigmoid bowel epiploica.  There appeared  to be appendiceal stump on the liver edge and the cul-de-sac was  otherwise normal.  The ureters were seen before and after removal of the  ovaries.   MEDICATIONS:  None.   COUNTS:  Correct x3.   TECHNIQUE:  After adequate general anesthesia was achieved, the patient  was prepped, draped in sterile fashion in dorsal lithotomy position.  A  sponge stick was placed in vagina and the bladder was emptied with a  catheter during the procedure.  A 2 cm infraumbilical incision was made  with the scalpel and the Veress needle passed into the abdomen without  aspiration of bowel contents or blood.  The abdomen was insufflated.  The 10 mm trocar placed without complication.  The two 5 mm trocars were  placed lateral to the respective the inferior  epigastrics.  This was  done under direct visualization of the camera.  Probes were entered and  the above findings noted.  The sigmoid was adhesed to the left anterior  abdominal wall as well as to the left ovary.  Sharp dissection with the  scissors carefully carried out in the filmy adhesions to remove the  bowel carefully and safely off of the ovary.  Good hemostasis was  achieved as well as the bowel being out of the field of dissection  directly.  Once the ovary was freed with the dissection, the  infundibulopelvic ligament and the ureter on the left could be seen.  The ureter was well under the field of dissection and infundibulopelvic  ligament was cauterized with the triple polar cautery.  Cautery  continued inferiorly medial to include the rest of the mesosalpinx and  the peritoneum as well as the tube.  The tube had been adhesed to the  anterior abdominal wall and had been removed with  sharp lysis of  adhesion with the scissors.  Once the ovary was removed, the ureters  were reinspected and found to be well out of the field of dissection.  Attention was then turned, specimen placed in the cul-de-sac and  attention was turned to the right side.  The infundibulopelvic ligament  was isolated with the triple polar cautery well away from the right  ureter.  The cautery continued along the ovary until the adhesions in  peritoneum had been removed from it as well.  Again the ureter as well  out of the field of dissection was identified both before and after the  dissection.   5 mm scope was placed and the right ovary was placed in the endobag and  removed through the 10 mm trocar site.  This was sent separately from  the left ovary which was also removed in the same way.  The tubes had  been removed as well.  Reinspection irrigation revealed hemostasis and  again the ureters out of the field of dissection.  The bowel had been  out of the field of dissection as well.  After  identifying the  appendiceal stump, all instruments were withdrawn from the abdomen and  the abdomen desufflated.  The attention was then turned to the abdomen  where a small 1 cm dark raised mole was seen on the left lateral side of  the abdomen.  A scalpel was used to make an ellipse around this mole and  the entire thing was removed.  Four stitches of 2-0 Vicryl Rapide were  placed and hemostasis was achieved.  A through-and-through stitch was  placed in each of the 5 mm trocar skin sites.  The fascia of the 10 mm  trocar site was closed with figure-of-eight stitch of 2-0 Vicryl.  The  skin was closed with Dermabond in all incisions.  The instruments  withdrawn from the abdomen and the abdomen desufflated.  All instruments  were withdrawn from the vagina and the patient tolerated procedure well  and she was returned to recovery in stable condition.      Carrington Clamp, M.D.  Electronically Signed     MH/MEDQ  D:  09/11/2006  T:  09/11/2006  Job:  [id number]

## 2011-01-02 ENCOUNTER — Encounter: Payer: Self-pay | Admitting: Internal Medicine

## 2011-01-05 ENCOUNTER — Encounter: Payer: Self-pay | Admitting: Internal Medicine

## 2011-01-05 ENCOUNTER — Ambulatory Visit (INDEPENDENT_AMBULATORY_CARE_PROVIDER_SITE_OTHER): Payer: Medicare Other | Admitting: Internal Medicine

## 2011-01-05 DIAGNOSIS — E785 Hyperlipidemia, unspecified: Secondary | ICD-10-CM

## 2011-01-05 DIAGNOSIS — M810 Age-related osteoporosis without current pathological fracture: Secondary | ICD-10-CM

## 2011-01-05 DIAGNOSIS — T887XXA Unspecified adverse effect of drug or medicament, initial encounter: Secondary | ICD-10-CM

## 2011-01-05 DIAGNOSIS — Z0000 Encounter for general adult medical examination without abnormal findings: Secondary | ICD-10-CM

## 2011-01-05 LAB — POCT URINALYSIS DIPSTICK
Bilirubin, UA: NEGATIVE
Blood, UA: NEGATIVE
Glucose, UA: NEGATIVE
Ketones, UA: NEGATIVE
Nitrite, UA: NEGATIVE
Spec Grav, UA: 1.03
pH, UA: 5.5

## 2011-01-05 LAB — HEPATIC FUNCTION PANEL
ALT: 13 U/L (ref 0–35)
Albumin: 4.7 g/dL (ref 3.5–5.2)
Alkaline Phosphatase: 41 U/L (ref 39–117)
Bilirubin, Direct: 0.1 mg/dL (ref 0.0–0.3)
Total Bilirubin: 0.7 mg/dL (ref 0.3–1.2)

## 2011-01-05 LAB — LIPID PANEL
HDL: 63.2 mg/dL (ref >39.00)
Total CHOL/HDL Ratio: 3
Triglycerides: 70 mg/dL (ref 0.0–149.0)

## 2011-01-05 LAB — CBC WITH DIFFERENTIAL/PLATELET
Eosinophils Relative: 2.4 % (ref 0.0–5.0)
HCT: 33.8 % — ABNORMAL LOW (ref 36.0–46.0)
Hemoglobin: 11.5 g/dL — ABNORMAL LOW (ref 12.0–15.0)
Lymphs Abs: 1.8 10*3/uL (ref 0.7–4.0)
MCV: 93.2 fl (ref 78.0–100.0)
Monocytes Absolute: 0.4 10*3/uL (ref 0.1–1.0)
Monocytes Relative: 6.5 % (ref 3.0–12.0)
Neutro Abs: 4.3 10*3/uL (ref 1.4–7.7)
WBC: 6.6 10*3/uL (ref 4.5–10.5)

## 2011-01-05 LAB — BASIC METABOLIC PANEL
CO2: 27 mEq/L (ref 19–32)
Calcium: 9 mg/dL (ref 8.4–10.5)
Creatinine, Ser: 0.9 mg/dL (ref 0.4–1.2)
Sodium: 144 mEq/L (ref 135–145)

## 2011-01-05 NOTE — Progress Notes (Signed)
Subjective:    Nancy [name] is a 75 y.o. female who presents for Medicare Annual/Subsequent preventive examination. Patient also is followed for mild hyperlipidemia osteoporosis with vitamin D deficiency appropriate screening labs will be added today for those issues Preventive Screening-Counseling & Management  Tobacco History  Smoking status  . Never Smoker   Smokeless tobacco  . Not on file     Problems Prior to Visit 1.   Current Problems (verified) Patient Active Problem List  Diagnoses  . HYPOTHYROIDISM  . HYPERLIPIDEMIA  . INSOMNIA, CHRONIC  . ALLERGIC RHINITIS  . THORACIC/LUMBOSACRAL NEURITIS/RADICULITIS UNSPEC  . BACK PAIN WITH RADICULOPATHY  . OSTEOPOROSIS  . ADVEF, DRUG/MEDICINAL/BIOLOGICAL SUBST NOS    Medications Prior to Visit Current Outpatient Prescriptions on File Prior to Visit  Medication Sig Dispense Refill  . Calcium Carbonate-Vitamin D (CALCARB 600/D) 600-400 MG-UNIT per tablet Take 1 tablet by mouth daily.        . Cholecalciferol (VITAMIN D3) 1000 UNITS CAPS Take 1 capsule by mouth daily.        . Eszopiclone 3 MG TABS Take 3 mg by mouth at bedtime. Take immediately before bedtime       . levothyroxine (SYNTHROID, LEVOTHROID) 75 MCG tablet Take 75 mcg by mouth daily.        . Multiple Vitamin (MULTIVITAMIN) tablet Take 1 tablet by mouth daily.          Current Medications (verified) Current Outpatient Prescriptions  Medication Sig Dispense Refill  . Calcium Carbonate-Vitamin D (CALCARB 600/D) 600-400 MG-UNIT per tablet Take 1 tablet by mouth daily.        . Cholecalciferol (VITAMIN D3) 1000 UNITS CAPS Take 1 capsule by mouth daily.        . Eszopiclone 3 MG TABS Take 3 mg by mouth at bedtime. Take immediately before bedtime       . levothyroxine (SYNTHROID, LEVOTHROID) 75 MCG tablet Take 75 mcg by mouth daily.        . Multiple Vitamin (MULTIVITAMIN) tablet Take 1 tablet by mouth daily.           Allergies (verified) Sulfamethoxazole    PAST HISTORY  Family History Family History  Problem Relation Age of Onset  . Dementia Mother   . Parkinsonism Father   . Osteoporosis    . Colon cancer      Social History History  Substance Use Topics  . Smoking status: Never Smoker   . Smokeless tobacco: Not on file  . Alcohol Use: No     Are there smokers in your home (other than you)? No  Risk Factors Current exercise habits: Home exercise routine includes walking 2 hrs per day.  Dietary issues discussed:   none  Cardiac risk factors: advanced age (older than 90 for men, 49 for women).  Depression Screen (Note: if answer to either of the following is "Yes", a more complete depression screening is indicated)   Over the past two weeks, have you felt down, depressed or hopeless? No  Over the past two weeks, have you felt little interest or pleasure in doing things? No  Have you lost interest or pleasure in daily life? No  Do you often feel hopeless? No  Do you cry easily over simple problems? No  Activities of Daily Living In your present state of health, do you have any difficulty performing the following activities?:  Driving? No Managing money?  No Feeding yourself? No Getting from bed to chair? No Climbing a flight of  stairs? No Preparing food and eating?: No Bathing or showering? No Getting dressed: No Getting to the toilet? No Using the toilet:No Moving around from place to place: No In the past year have you fallen or had a near fall?:No   Are you sexually active?  No  Do you have more than one partner?  No  Hearing Difficulties: No Do you often ask people to speak up or repeat themselves? No Do you experience ringing or noises in your ears? No Do you have difficulty understanding soft or whispered voices? No   Do you feel that you have a problem with memory? No  Do you often misplace items? No  Do you feel safe at home?  No  Cognitive Testing  Alert? Yes  Normal Appearance?Yes  Oriented  to person? Yes  Place? Yes   Time? Yes  Recall of three objects?  Yes  Can perform simple calculations? Yes  Displays appropriate judgment?Yes  Can read the correct time from a watch face?Yes   Advanced Directives have been discussed with the patient? No  List the Names of Other Physician/Practitioners you currently use: 1.  Judith Part, [facility] [place]   Indicate any recent Medical Services you may have received from other than [facility] providers in the past year (date may be approximate).  Immunization History  Administered Date(s) Administered  . Influenza Whole 04/17/2007, 04/06/2010  . Pneumococcal Polysaccharide 04/14/2008  . Td 06/18/2005, 03/18/2009  . Zoster 04/27/2008    Screening Tests Health Maintenance  Topic Date Due  . Influenza Vaccine  03/19/2011  . Colonoscopy  06/18/2016  . Tetanus/tdap  03/19/2019  . Pneumococcal Polysaccharide Vaccine Age 13 And Over  Completed  . Zostavax  Completed    All answers were reviewed with the patient and necessary referrals were made:  Nancy [name]   01/05/2011   History reviewed: allergies, current medications, past family history, past medical history, past social history, past surgical history and problem list  Review of Systems A comprehensive review of systems was negative.    Objective:     Vision by Snellen chart: right eye:20/20, left eye:20/20  Body mass index is 22.76 kg/(m^2). BP 110/70  Pulse 72  Temp 98.2 F (36.8 C)  Resp 14  Ht 5\' 6"  (1.676 m)  Wt 141 lb (63.957 kg)  BMI 22.76 kg/m2  BP 110/70  Pulse 72  Temp 98.2 F (36.8 C)  Resp 14  Ht 5\' 6"  (1.676 m)  Wt 141 lb (63.957 kg)  BMI 22.76 kg/m2  General Appearance:    Alert, cooperative, no distress, appears stated age  Head:    Normocephalic, without obvious abnormality, atraumatic  Eyes:    PERRL, conjunctiva/corneas clear, EOM's intact, fundi    benign, both eyes  Ears:    Normal TM's and external ear canals, both ears  Nose:    Nares normal, septum midline, mucosa normal, no drainage    or sinus tenderness  Throat:   Lips, mucosa, and tongue normal; teeth and gums normal  Neck:   Supple, symmetrical, trachea midline, no adenopathy;    thyroid:  no enlargement/tenderness/nodules; no carotid   bruit or JVD  Back:     Symmetric, no curvature, ROM normal, no CVA tenderness  Lungs:     Clear to auscultation bilaterally, respirations unlabored  Chest Wall:    No tenderness or deformity   Heart:    Regular rate and rhythm, S1 and S2 normal, no murmur, rub   or gallop  Breast Exam:    No tenderness, masses, or nipple abnormality  Abdomen:     Soft, non-tender, bowel sounds active all four quadrants,    no masses, no organomegaly  Genitalia:    Normal female without lesion, discharge or tenderness  Rectal:    Normal tone, normal prostate, no masses or tenderness;   guaiac negative stool  Extremities:   Extremities normal, atraumatic, no cyanosis or edema  Pulses:   2+ and symmetric all extremities  Skin:   Skin color, texture, turgor normal, no rashes or lesions  Lymph nodes:   Cervical, supraclavicular, and axillary nodes normal  Neurologic:   CNII-XII intact, normal strength, sensation and reflexes    throughout       Assessment:      This is a routine physical examination for this healthy  Female. Reviewed all health maintenance protocols including mammography colonoscopy bone density and reviewed appropriate screening labs. Her immunization history was reviewed as well as her current medications and allergies refills of her chronic medications were given and the plan for yearly health maintenance was discussed all orders and referrals were made as appropriate.      Plan:     During the course of the visit the patient was educated and counseled about appropriate screening and preventive services including:    Influenza vaccine  Screening mammography  Advanced directives: has NO advanced directive - not  interested in additional information  Diet review for nutrition referral? Yes ___x _  Not Indicated ___x _   Patient Instructions (the written plan) was given to the patient.  Medicare Attestation I have personally reviewed: The patient's medical and social history Their use of alcohol, tobacco or illicit drugs Their current medications and supplements The patient's functional ability including ADLs,fall risks, home safety risks, cognitive, and hearing and visual impairment Diet and physical activities Evidence for depression or mood disorders  The patient's weight, height, BMI, and visual acuity have been recorded in the chart.  I have made referrals, counseling, and provided education to the patient based on review of the above and I have provided the patient with a written personalized care plan for preventive services.     Nancy [name]   01/05/2011

## 2011-01-05 NOTE — Patient Instructions (Signed)
Yearly flu shot in the fall

## 2011-04-23 ENCOUNTER — Other Ambulatory Visit: Payer: Self-pay | Admitting: Internal Medicine

## 2011-04-23 MED ORDER — ESZOPICLONE 3 MG PO TABS: 3.0000 mg | ORAL_TABLET | Freq: Every day | ORAL | Status: DC

## 2011-04-23 NOTE — Telephone Encounter (Signed)
Pt informed ready for pick up 

## 2011-04-23 NOTE — Telephone Encounter (Signed)
Pt need new rx lunesta 3mg  #90 with one refill. Pt will pick up rx to send to [facility].

## 2011-08-06 ENCOUNTER — Telehealth: Payer: Self-pay | Admitting: Internal Medicine

## 2011-08-06 MED ORDER — LEVOTHYROXINE SODIUM 75 MCG PO TABS: 75.0000 ug | ORAL_TABLET | Freq: Every day | ORAL | Status: DC

## 2011-08-06 NOTE — Telephone Encounter (Signed)
levothyroxine (SYNTHROID, LEVOTHROID) 75 MCG tablet - needs refill called into [facility] [phone] 1 yr - 90 day refill

## 2011-09-26 DIAGNOSIS — L570 Actinic keratosis: Secondary | ICD-10-CM | POA: Diagnosis not present

## 2011-12-21 ENCOUNTER — Telehealth: Payer: Self-pay | Admitting: Internal Medicine

## 2011-12-21 NOTE — Telephone Encounter (Signed)
Pt is sch for 01-29-2012

## 2011-12-21 NOTE — Telephone Encounter (Signed)
Pt needs appt

## 2011-12-21 NOTE — Telephone Encounter (Signed)
Pt needs refill on lunesta 3 mg #90 with refills call [facility] [phone].

## 2012-01-29 ENCOUNTER — Encounter: Payer: Self-pay | Admitting: Internal Medicine

## 2012-01-29 ENCOUNTER — Ambulatory Visit (INDEPENDENT_AMBULATORY_CARE_PROVIDER_SITE_OTHER): Payer: Medicare Other | Admitting: Internal Medicine

## 2012-01-29 VITALS — BP 110/74 | HR 72 | Temp 98.2°F | Resp 16 | Ht 66.0 in | Wt 148.0 lb

## 2012-01-29 DIAGNOSIS — E038 Other specified hypothyroidism: Secondary | ICD-10-CM

## 2012-01-29 DIAGNOSIS — E785 Hyperlipidemia, unspecified: Secondary | ICD-10-CM | POA: Diagnosis not present

## 2012-01-29 DIAGNOSIS — F5105 Insomnia due to other mental disorder: Secondary | ICD-10-CM

## 2012-01-29 DIAGNOSIS — F4321 Adjustment disorder with depressed mood: Secondary | ICD-10-CM | POA: Diagnosis not present

## 2012-01-29 DIAGNOSIS — T887XXA Unspecified adverse effect of drug or medicament, initial encounter: Secondary | ICD-10-CM

## 2012-01-29 DIAGNOSIS — F489 Nonpsychotic mental disorder, unspecified: Secondary | ICD-10-CM

## 2012-01-29 LAB — CBC WITH DIFFERENTIAL/PLATELET
Basophils Absolute: 0.1 10*3/uL (ref 0.0–0.1)
Basophils Relative: 0.8 % (ref 0.0–3.0)
HCT: 36.6 % (ref 36.0–46.0)
Hemoglobin: 11.8 g/dL — ABNORMAL LOW (ref 12.0–15.0)
Lymphs Abs: 1.7 10*3/uL (ref 0.7–4.0)
Monocytes Relative: 6.7 % (ref 3.0–12.0)
Neutro Abs: 4.6 10*3/uL (ref 1.4–7.7)
RBC: 3.87 Mil/uL (ref 3.87–5.11)
RDW: 13.6 % (ref 11.5–14.6)

## 2012-01-29 MED ORDER — LEVOTHYROXINE SODIUM 75 MCG PO TABS: 75.0000 ug | ORAL_TABLET | Freq: Every day | ORAL | Status: DC

## 2012-01-29 MED ORDER — ESZOPICLONE 3 MG PO TABS: 3.0000 mg | ORAL_TABLET | Freq: Every day | ORAL | Status: DC

## 2012-01-29 NOTE — Patient Instructions (Addendum)
The patient is instructed to continue all medications as prescribed. Schedule followup with check out clerk upon leaving the clinic  

## 2012-01-29 NOTE — Progress Notes (Signed)
Subjective:    Patient ID: Nancy [name], female    DOB: [birth year], 76 y.o.   MRN: [id number]  HPI monitoring for hypothyroidism Lipid reviewed Discussion of chronic insomnia   Review of Systems  Constitutional: Negative for activity change, appetite change and fatigue.  HENT: Negative for ear pain, congestion, neck pain, postnasal drip and sinus pressure.   Eyes: Negative for redness and visual disturbance.  Respiratory: Negative for cough, shortness of breath and wheezing.   Gastrointestinal: Negative for abdominal pain and abdominal distention.  Genitourinary: Negative for dysuria, frequency and menstrual problem.  Musculoskeletal: Negative for myalgias, joint swelling and arthralgias.  Skin: Negative for rash and wound.  Neurological: Negative for dizziness, weakness and headaches.  Hematological: Negative for adenopathy. Does not bruise/bleed easily.  Psychiatric/Behavioral: Negative for disturbed wake/sleep cycle and decreased concentration.   Past Medical History  Diagnosis Date  . Allergy   . Osteoporosis   . Hyperlipidemia   . Osteoporosis     History   Social History  . Marital Status: Married    Spouse Name: N/A    Number of Children: N/A  . Years of Education: N/A   Occupational History  . Not on file.   Social History Main Topics  . Smoking status: Never Smoker   . Smokeless tobacco: Not on file  . Alcohol Use: No  . Drug Use: No  . Sexually Active: Yes   Other Topics Concern  . Not on file   Social History Narrative  . No narrative on file    Past Surgical History  Procedure Date  . Abdominal hysterectomy   . Appendectomy   . Tonsillectomy     Family History  Problem Relation Age of Onset  . Dementia Mother   . Parkinsonism Father   . Osteoporosis    . Colon cancer      Allergies  Allergen Reactions  . Sulfamethoxazole     REACTION: unspecified    Current Outpatient Prescriptions on File Prior to Visit  Medication Sig  Dispense Refill  . Calcium Carbonate-Vitamin D (CALCARB 600/D) 600-400 MG-UNIT per tablet Take 1 tablet by mouth daily.        . Cholecalciferol (VITAMIN D3) 1000 UNITS CAPS Take 1 capsule by mouth daily.        Marland Kitchen levothyroxine (SYNTHROID, LEVOTHROID) 75 MCG tablet Take 1 tablet (75 mcg total) by mouth daily.  90 tablet  3  . Multiple Vitamin (MULTIVITAMIN) tablet Take 1 tablet by mouth daily.        Marland Kitchen DISCONTD: Eszopiclone 3 MG TABS Take 1 tablet (3 mg total) by mouth at bedtime. Take immediately before bedtime  90 tablet  1    BP 110/74  Pulse 72  Temp 98.2 F (36.8 C)  Resp 16  Ht 5\' 6"  (1.676 m)  Wt 148 lb (67.132 kg)  BMI 23.89 kg/m2        Objective:   Physical Exam  Nursing note and vitals reviewed. Constitutional: She is oriented to person, place, and time. She appears well-developed and well-nourished. No distress.  HENT:  Head: Normocephalic and atraumatic.  Right Ear: External ear normal.  Left Ear: External ear normal.  Nose: Nose normal.  Mouth/Throat: Oropharynx is clear and moist.  Eyes: Conjunctivae and EOM are normal. Pupils are equal, round, and reactive to light.  Neck: Normal range of motion. Neck supple. No JVD present. No tracheal deviation present. No thyromegaly present.  Cardiovascular: Normal rate, regular rhythm, normal heart sounds and intact  distal pulses.   No murmur heard. Pulmonary/Chest: Effort normal and breath sounds normal. She has no wheezes. She exhibits no tenderness.  Abdominal: Soft. Bowel sounds are normal.  Musculoskeletal: Normal range of motion. She exhibits no edema and no tenderness.  Lymphadenopathy:    She has no cervical adenopathy.  Neurological: She is alert and oriented to person, place, and time. She has normal reflexes. No cranial nerve deficit.  Skin: Skin is warm and dry. She is not diaphoretic.  Psychiatric: She has a normal mood and affect. Her behavior is normal.         Assessment & Plan:  monitoring for  hypothyroidism discussion of weight loss Appropriate labs orded for screening and monitoring of Lipid, liver, thyroid  HAs GYN and mamogram ordered Refill sleeping medications

## 2012-01-30 LAB — HEPATIC FUNCTION PANEL
ALT: 11 U/L (ref 0–35)
AST: 18 U/L (ref 0–37)
Alkaline Phosphatase: 44 U/L (ref 39–117)
Bilirubin, Direct: 0 mg/dL (ref 0.0–0.3)
Total Bilirubin: 0.5 mg/dL (ref 0.3–1.2)
Total Protein: 7 g/dL (ref 6.0–8.3)

## 2012-01-30 LAB — BASIC METABOLIC PANEL
Chloride: 106 mEq/L (ref 96–112)
Creatinine, Ser: 0.9 mg/dL (ref 0.4–1.2)
Sodium: 140 mEq/L (ref 135–145)

## 2012-01-30 LAB — LIPID PANEL
LDL Cholesterol: 102 mg/dL — ABNORMAL HIGH (ref 0–99)
Total CHOL/HDL Ratio: 3
Triglycerides: 91 mg/dL (ref 0.0–149.0)

## 2012-02-13 DIAGNOSIS — Z1231 Encounter for screening mammogram for malignant neoplasm of breast: Secondary | ICD-10-CM | POA: Diagnosis not present

## 2012-02-13 DIAGNOSIS — Z1289 Encounter for screening for malignant neoplasm of other sites: Secondary | ICD-10-CM | POA: Diagnosis not present

## 2012-02-19 ENCOUNTER — Other Ambulatory Visit: Payer: Self-pay | Admitting: Internal Medicine

## 2012-02-19 NOTE — Telephone Encounter (Signed)
I suggest pt come in for OV with one of the [facility] providers to discuss which other medication would be appropriate for pt.  I do not feel comfortable changing medications w/o evaluating patient.

## 2012-02-19 NOTE — Telephone Encounter (Signed)
Nancy [name], this was sitting back in the CAN pool. I wasn't sure if you meant to route it somewhere? Thanks!

## 2012-02-19 NOTE — Telephone Encounter (Signed)
Called and spoke to pt and pt is aware that PCP is out of the office until Monday.  Pt states that her husband passed away on last 12-11-22 and Dr. Lovell Sheehan made pt promised that she would call if she needed something to sleep.  Pls advise pt states she needs something soon.

## 2012-02-19 NOTE — Telephone Encounter (Signed)
Caller: Nancy [name]/Patient; PCP: Darryll Capers (Adults only); CB#: [phone]; Call regarding Pt following up with Dr.  Lovell Sheehan about a prescription for sleeping. Pt says the [name] is not working for her at this time.  Pt saw Dr. Lovell Sheehan on 01/29/12 and was instructed to call in if she wanted to try something else.  Pt verified Pharmacy as [facility] [address] [phone].  PLEAE FOLLOW UP WITH PT ABOUT SCRIPT. Thank you.

## 2012-02-20 DIAGNOSIS — H5200 Hypermetropia, unspecified eye: Secondary | ICD-10-CM | POA: Diagnosis not present

## 2012-02-20 DIAGNOSIS — H2510 Age-related nuclear cataract, unspecified eye: Secondary | ICD-10-CM | POA: Diagnosis not present

## 2012-02-20 MED ORDER — ALPRAZOLAM 0.25 MG PO TABS: 0.2500 mg | ORAL_TABLET | Freq: Every day | ORAL | Status: AC

## 2012-02-20 MED ORDER — ZOLPIDEM TARTRATE 5 MG PO TABS: 5.0000 mg | ORAL_TABLET | Freq: Every evening | ORAL | Status: DC | PRN

## 2012-02-20 NOTE — Telephone Encounter (Signed)
Called pt to make aware of Dr. Olegario Messier recommendations and pt states it is not convienent right now for her to come in and pt would rather have the message wait for Dr. Lovell Sheehan.

## 2012-02-20 NOTE — Telephone Encounter (Signed)
May call in xanax .25 prn anxiety and sleep and or ambien 5 mg po q HS prn sleep.

## 2012-02-20 NOTE — Telephone Encounter (Signed)
Will attempt to call pt at a later time.   

## 2012-02-20 NOTE — Telephone Encounter (Addendum)
Called and left a message for pt on home voicemail.  Rx called in to [facility] on [address] for alprazolam and zolpidem.

## 2012-02-20 NOTE — Telephone Encounter (Signed)
Called and spoke with pt and pt is aware.  

## 2012-03-19 DIAGNOSIS — Z23 Encounter for immunization: Secondary | ICD-10-CM | POA: Diagnosis not present

## 2012-05-02 ENCOUNTER — Encounter: Payer: Self-pay | Admitting: Internal Medicine

## 2012-06-13 ENCOUNTER — Ambulatory Visit (AMBULATORY_SURGERY_CENTER): Payer: Medicare Other | Admitting: *Deleted

## 2012-06-13 VITALS — Ht 66.0 in | Wt 150.2 lb

## 2012-06-13 DIAGNOSIS — Z1211 Encounter for screening for malignant neoplasm of colon: Secondary | ICD-10-CM

## 2012-06-13 MED ORDER — MOVIPREP 100 G PO SOLR: ORAL | Status: DC

## 2012-06-26 ENCOUNTER — Ambulatory Visit (AMBULATORY_SURGERY_CENTER): Payer: Medicare Other | Admitting: Internal Medicine

## 2012-06-26 ENCOUNTER — Encounter: Payer: Self-pay | Admitting: Internal Medicine

## 2012-06-26 VITALS — BP 135/73 | HR 80 | Temp 97.1°F | Resp 19 | Ht 66.0 in | Wt 150.0 lb

## 2012-06-26 DIAGNOSIS — Z1211 Encounter for screening for malignant neoplasm of colon: Secondary | ICD-10-CM | POA: Diagnosis not present

## 2012-06-26 DIAGNOSIS — Z86010 Personal history of colonic polyps: Secondary | ICD-10-CM | POA: Diagnosis not present

## 2012-06-26 MED ORDER — SODIUM CHLORIDE 0.9 % IV SOLN: 500.0000 mL | INTRAVENOUS | Status: DC

## 2012-06-26 NOTE — Progress Notes (Signed)
[id number] a/ox3 pleased report to [employer]

## 2012-06-26 NOTE — Progress Notes (Signed)
Patient did not experience any of the following events: a burn prior to discharge; a fall within the facility; wrong site/side/patient/procedure/implant event; or a hospital transfer or hospital admission upon discharge from the facility. (G8907) Patient did not have preoperative order for IV antibiotic SSI prophylaxis. ([id number])  

## 2012-06-26 NOTE — Patient Instructions (Addendum)
YOU HAD AN ENDOSCOPIC PROCEDURE TODAY AT THE [facility]: Refer to the procedure report that was given to you for any specific questions about what was found during the examination.  If the procedure report does not answer your questions, please call your gastroenterologist to clarify.  If you requested that your care partner not be given the details of your procedure findings, then the procedure report has been included in a sealed envelope for you to review at your convenience later.  YOU SHOULD EXPECT: Some feelings of bloating in the abdomen. Passage of more gas than usual.  Walking can help get rid of the air that was put into your GI tract during the procedure and reduce the bloating. If you had a lower endoscopy (such as a colonoscopy or flexible sigmoidoscopy) you may notice spotting of blood in your stool or on the toilet paper. If you underwent a bowel prep for your procedure, then you may not have a normal bowel movement for a few days.  DIET: Your first meal following the procedure should be a light meal and then it is ok to progress to your normal diet.  A half-sandwich or bowl of soup is an example of a good first meal.  Heavy or fried foods are harder to digest and may make you feel nauseous or bloated.  Likewise meals heavy in dairy and vegetables can cause extra gas to form and this can also increase the bloating.  Drink plenty of fluids but you should avoid alcoholic beverages for 24 hours.  ACTIVITY: Your care partner should take you home directly after the procedure.  You should plan to take it easy, moving slowly for the rest of the day.  You can resume normal activity the day after the procedure however you should NOT DRIVE or use heavy machinery for 24 hours (because of the sedation medicines used during the test).    SYMPTOMS TO REPORT IMMEDIATELY: A gastroenterologist can be reached at any hour.  During normal business hours, 8:30 AM to 5:00 PM Monday through Friday,  call [phone].  After hours and on weekends, please call the GI answering service at [phone] who will take a message and have the physician on call contact you.   Following lower endoscopy (colonoscopy or flexible sigmoidoscopy):  Excessive amounts of blood in the stool  Significant tenderness or worsening of abdominal pains  Swelling of the abdomen that is new, acute  Fever of 100F or higher  FOLLOW UP: If any biopsies were taken you will be contacted by phone or by letter within the next 1-3 weeks.  Call your gastroenterologist if you have not heard about the biopsies in 3 weeks.  Our staff will call the home number listed on your records the next business day following your procedure to check on you and address any questions or concerns that you may have at that time regarding the information given to you following your procedure. This is a courtesy call and so if there is no answer at the home number and we have not heard from you through the emergency physician on call, we will assume that you have returned to your regular daily activities without incident.  SIGNATURES/CONFIDENTIALITY: You and/or your care partner have signed paperwork which will be entered into your electronic medical record.  These signatures attest to the fact that that the information above on your After Visit Summary has been reviewed and is understood.  Full responsibility of the confidentiality of this   discharge information lies with you and/or your care-partner.  Resume medications. 

## 2012-06-26 NOTE — Op Note (Signed)
[facility] [address] [place], [address]   COLONOSCOPY PROCEDURE REPORT  PATIENT: Nancy, [name]  MR#: [id number] BIRTHDATE: [birth year] , 76  yrs. old GENDER: Female ENDOSCOPIST: Roxy Cedar, MD REFERRED [id number] Program Recall PROCEDURE DATE:  06/26/2012 PROCEDURE:   Colonoscopy, surveillance ASA CLASS:   Class II INDICATIONS:Patient's personal history of adenomatous colon polyps. 2005, 2008 MEDICATIONS: MAC sedation, administered by CRNA and propofol (Diprivan) 170mg  IV  DESCRIPTION OF PROCEDURE:   After the risks benefits and alternatives of the procedure were thoroughly explained, informed consent was obtained.  A digital rectal exam revealed no abnormalities of the rectum.   The LB [id number]  endoscope was introduced through the anus and advanced to the cecum, which was identified by both the appendix and ileocecal valve. No adverse events experienced.   The quality of the prep was excellent, using MoviPrep  The instrument was then slowly withdrawn as the colon was fully examined.      COLON FINDINGS: A normal appearing cecum, ileocecal valve, and appendiceal orifice were identified.  The ascending, hepatic flexure, transverse, splenic flexure, descending, sigmoid colon and rectum appeared unremarkable.  No polyps or cancers were seen. Retroflexed views revealed internal hemorrhoids. The time to cecum=4 minutes 05 seconds.  Withdrawal time=7 minutes 04 seconds. The scope was withdrawn and the procedure completed. COMPLICATIONS: There were no complications.  ENDOSCOPIC IMPRESSION: 1.  Normal colon  RECOMMENDATIONS: 1. Return to the care of your primary provider.  GI follow up as needed   eSigned:  Roxy Cedar, MD 06/26/2012 10:55 AM   cc: Stacie Glaze, MD and The Patient

## 2012-06-27 ENCOUNTER — Telehealth: Payer: Self-pay | Admitting: *Deleted

## 2012-06-27 NOTE — Telephone Encounter (Signed)
  Follow up Call-  Call back number 06/26/2012  Post procedure Call Back phone  # [phone]  Permission to leave phone message Yes     Patient questions:  Do you have a fever, pain , or abdominal swelling? no Pain Score  0 *  Have you tolerated food without any problems? yes  Have you been able to return to your normal activities? yes  Do you have any questions about your discharge instructions: Diet   no Medications  no Follow up visit  no  Do you have questions or concerns about your Care? no  Actions: * If pain score is 4 or above: No action needed, pain <4.

## 2012-09-15 ENCOUNTER — Encounter: Payer: Self-pay | Admitting: Internal Medicine

## 2012-09-15 ENCOUNTER — Ambulatory Visit (INDEPENDENT_AMBULATORY_CARE_PROVIDER_SITE_OTHER): Payer: Medicare Other | Admitting: Internal Medicine

## 2012-09-15 VITALS — BP 120/74 | HR 72 | Temp 98.3°F | Resp 16 | Ht 66.0 in | Wt 154.0 lb

## 2012-09-15 DIAGNOSIS — E039 Hypothyroidism, unspecified: Secondary | ICD-10-CM

## 2012-09-15 DIAGNOSIS — E038 Other specified hypothyroidism: Secondary | ICD-10-CM

## 2012-09-15 DIAGNOSIS — G4700 Insomnia, unspecified: Secondary | ICD-10-CM | POA: Diagnosis not present

## 2012-09-15 MED ORDER — LEVOTHYROXINE SODIUM 75 MCG PO TABS: 75.0000 ug | ORAL_TABLET | Freq: Every day | ORAL | Status: DC

## 2012-09-15 MED ORDER — ESZOPICLONE 3 MG PO TABS: 3.0000 mg | ORAL_TABLET | Freq: Every day | ORAL | Status: DC

## 2012-09-15 NOTE — Patient Instructions (Signed)
The patient is instructed to continue all medications as prescribed. Schedule followup with check out clerk upon leaving the clinic  

## 2012-09-15 NOTE — Progress Notes (Signed)
Subjective:    Patient ID: Nancy [name], female    DOB: [birth year], 77 y.o.   MRN: [id number]  HPI reviewed grief and loss Monitoring for thyroid replacement Needs wellness   Patient also needs refill on sleeping medications patient also brings in her living will  Review of Systems  Constitutional: Negative for activity change, appetite change and fatigue.  HENT: Negative for ear pain, congestion, neck pain, postnasal drip and sinus pressure.   Eyes: Negative for redness and visual disturbance.  Respiratory: Negative for cough, shortness of breath and wheezing.   Gastrointestinal: Negative for abdominal pain and abdominal distention.  Genitourinary: Negative for dysuria, frequency and menstrual problem.  Musculoskeletal: Negative for myalgias, joint swelling and arthralgias.  Skin: Negative for rash and wound.  Neurological: Negative for dizziness, weakness and headaches.  Hematological: Negative for adenopathy. Does not bruise/bleed easily.  Psychiatric/Behavioral: Negative for sleep disturbance and decreased concentration.   Past Medical History  Diagnosis Date  . Allergy   . Osteoporosis   . Hypothyroidism     History   Social History  . Marital Status: Married    Spouse Name: N/A    Number of Children: N/A  . Years of Education: N/A   Occupational History  . Not on file.   Social History Main Topics  . Smoking status: Never Smoker   . Smokeless tobacco: Never Used  . Alcohol Use: No  . Drug Use: No  . Sexually Active: Yes   Other Topics Concern  . Not on file   Social History Narrative  . No narrative on file    Past Surgical History  Procedure Laterality Date  . Abdominal hysterectomy  1975  . Appendectomy    . Tonsillectomy    . Laparoscopy      removal of ovaries  . Removal growth thyroid  1998    Family History  Problem Relation Age of Onset  . Dementia Mother   . Colon cancer Mother 54  . Parkinsonism Father   . Osteoporosis    .  Colon cancer Sister 68  . Stomach cancer Neg Hx     Allergies  Allergen Reactions  . Norco (Hydrocodone-Acetaminophen) Hives  . Penicillins Hives  . Sulfamethoxazole Hives    Current Outpatient Prescriptions on File Prior to Visit  Medication Sig Dispense Refill  . Calcium Carbonate-Vitamin D (CALCARB 600/D) 600-400 MG-UNIT per tablet Take 1 tablet by mouth daily.        . Cholecalciferol (VITAMIN D3) 1000 UNITS CAPS Take 1 capsule by mouth daily. Takes 2000 units daily      . Eszopiclone 3 MG TABS Take 1 tablet (3 mg total) by mouth at bedtime. Take immediately before bedtime  90 tablet  1  . levothyroxine (SYNTHROID, LEVOTHROID) 75 MCG tablet Take 1 tablet (75 mcg total) by mouth daily.  90 tablet  3  . Multiple Vitamin (MULTIVITAMIN) tablet Take 1 tablet by mouth daily.         No current facility-administered medications on file prior to visit.    BP 120/74  Pulse 72  Temp(Src) 98.3 F (36.8 C)  Resp 16  Ht 5\' 6"  (1.676 m)  Wt 154 lb (69.854 kg)  BMI 24.87 kg/m2        Objective:   Physical Exam  Constitutional: She is oriented to person, place, and time. She appears well-developed and well-nourished. No distress.  HENT:  Head: Normocephalic and atraumatic.  Eyes: Conjunctivae and EOM are normal. Pupils are equal, round,  and reactive to light.  Neck: Normal range of motion. Neck supple. No JVD present. No tracheal deviation present. No thyromegaly present.  Cardiovascular: Normal rate, regular rhythm and intact distal pulses.   Murmur heard. Pulmonary/Chest: Effort normal and breath sounds normal. She has no wheezes. She exhibits no tenderness.  Abdominal: Soft. Bowel sounds are normal.  Musculoskeletal: She exhibits no edema and no tenderness.  Lymphadenopathy:    She has no cervical adenopathy.  Neurological: She is alert and oriented to person, place, and time. She has normal reflexes. No cranial nerve deficit.  Skin: She is not diaphoretic.           Assessment & Plan:  Living will and patient's wishes and placed in chart.  Monitoring for hypothyroidism with TSH and free T4 today.  Refill of sleeping medications and Synthroid.  Schedule for wellness examination with lipid and liver

## 2012-11-03 ENCOUNTER — Ambulatory Visit: Payer: Medicare Other | Admitting: Internal Medicine

## 2012-12-30 DIAGNOSIS — L570 Actinic keratosis: Secondary | ICD-10-CM | POA: Diagnosis not present

## 2013-02-20 ENCOUNTER — Encounter: Payer: Medicare Other | Admitting: Internal Medicine

## 2013-02-23 ENCOUNTER — Encounter: Payer: Self-pay | Admitting: Internal Medicine

## 2013-02-23 ENCOUNTER — Ambulatory Visit: Payer: Medicare Other

## 2013-02-23 ENCOUNTER — Ambulatory Visit (INDEPENDENT_AMBULATORY_CARE_PROVIDER_SITE_OTHER): Payer: Medicare Other | Admitting: Internal Medicine

## 2013-02-23 VITALS — BP 120/70 | HR 72 | Temp 98.2°F | Resp 16 | Ht 72.0 in | Wt 151.0 lb

## 2013-02-23 DIAGNOSIS — M810 Age-related osteoporosis without current pathological fracture: Secondary | ICD-10-CM

## 2013-02-23 DIAGNOSIS — T887XXA Unspecified adverse effect of drug or medicament, initial encounter: Secondary | ICD-10-CM | POA: Diagnosis not present

## 2013-02-23 DIAGNOSIS — Z136 Encounter for screening for cardiovascular disorders: Secondary | ICD-10-CM | POA: Diagnosis not present

## 2013-02-23 DIAGNOSIS — E039 Hypothyroidism, unspecified: Secondary | ICD-10-CM

## 2013-02-23 DIAGNOSIS — Z0000 Encounter for general adult medical examination without abnormal findings: Secondary | ICD-10-CM | POA: Diagnosis not present

## 2013-02-23 DIAGNOSIS — Z23 Encounter for immunization: Secondary | ICD-10-CM | POA: Diagnosis not present

## 2013-02-23 DIAGNOSIS — E785 Hyperlipidemia, unspecified: Secondary | ICD-10-CM

## 2013-02-23 LAB — CBC WITH DIFFERENTIAL/PLATELET
Basophils Absolute: 0.1 10*3/uL (ref 0.0–0.1)
Eosinophils Relative: 5.6 % — ABNORMAL HIGH (ref 0.0–5.0)
HCT: 35.6 % — ABNORMAL LOW (ref 36.0–46.0)
Hemoglobin: 12.2 g/dL (ref 12.0–15.0)
Lymphs Abs: 1.8 10*3/uL (ref 0.7–4.0)
Monocytes Relative: 6.8 % (ref 3.0–12.0)
Neutro Abs: 3.2 10*3/uL (ref 1.4–7.7)
RDW: 13.4 % (ref 11.5–14.6)

## 2013-02-23 LAB — BASIC METABOLIC PANEL
GFR: 60.9 mL/min (ref >60.00)
Glucose, Bld: 95 mg/dL (ref 70–99)
Potassium: 4.5 mEq/L (ref 3.5–5.1)
Sodium: 141 mEq/L (ref 135–145)

## 2013-02-23 LAB — HEPATIC FUNCTION PANEL
ALT: 10 U/L (ref 0–35)
AST: 17 U/L (ref 0–37)
Albumin: 4.3 g/dL (ref 3.5–5.2)
Total Protein: 7.5 g/dL (ref 6.0–8.3)

## 2013-02-23 LAB — LIPID PANEL: Cholesterol: 204 mg/dL — ABNORMAL HIGH (ref 0–200)

## 2013-02-23 MED ORDER — FISH OIL 1000 MG PO CAPS: 3.0000 | ORAL_CAPSULE | Freq: Every day | ORAL | Status: DC

## 2013-02-23 NOTE — Patient Instructions (Signed)
The patient is instructed to continue all medications as prescribed. Schedule followup with check out clerk upon leaving the clinic  

## 2013-02-23 NOTE — Progress Notes (Signed)
Subjective:    Patient ID: Nancy [name], female    DOB: [birth year], 77 y.o.   MRN: [id number]  HPI  Patient is a 77 year old female who presents for a Medicare wellness examination She has red white and blue medicare Is also followed for hyperlipidemia,mild insomnia and history of osteoporosis and a history of low back radiculopathy.  Her medications were reviewed her allergies were reviewed her past medical history was reviewed with the patient  Review of Systems  Constitutional: Negative for activity change, appetite change and fatigue.  HENT: Negative for ear pain, congestion, neck pain, postnasal drip and sinus pressure.   Eyes: Negative for redness and visual disturbance.  Respiratory: Negative for cough, shortness of breath and wheezing.   Gastrointestinal: Negative for abdominal pain and abdominal distention.  Genitourinary: Negative for dysuria, frequency and menstrual problem.  Musculoskeletal: Negative for myalgias, joint swelling and arthralgias.  Skin: Negative for rash and wound.  Neurological: Negative for dizziness, weakness and headaches.  Hematological: Negative for adenopathy. Does not bruise/bleed easily.  Psychiatric/Behavioral: Negative for sleep disturbance and decreased concentration.   Past Medical History  Diagnosis Date  . Allergy   . Osteoporosis   . Hypothyroidism     History   Social History  . Marital Status: Married    Spouse Name: N/A    Number of Children: N/A  . Years of Education: N/A   Occupational History  . Not on file.   Social History Main Topics  . Smoking status: Never Smoker   . Smokeless tobacco: Never Used  . Alcohol Use: No  . Drug Use: No  . Sexual Activity: Yes   Other Topics Concern  . Not on file   Social History Narrative  . No narrative on file    Past Surgical History  Procedure Laterality Date  . Abdominal hysterectomy  1975  . Appendectomy    . Tonsillectomy    . Laparoscopy      removal of  ovaries  . Removal growth thyroid  1998    Family History  Problem Relation Age of Onset  . Dementia Mother   . Colon cancer Mother 35  . Parkinsonism Father   . Osteoporosis    . Colon cancer Sister 45  . Stomach cancer Neg Hx     Allergies  Allergen Reactions  . Norco [Hydrocodone-Acetaminophen] Hives  . Penicillins Hives  . Sulfamethoxazole Hives    Current Outpatient Prescriptions on File Prior to Visit  Medication Sig Dispense Refill  . Calcium Carbonate-Vitamin D (CALCARB 600/D) 600-400 MG-UNIT per tablet Take 1 tablet by mouth daily.        . Cholecalciferol (VITAMIN D3) 1000 UNITS CAPS Take 1 capsule by mouth daily. Takes 2000 units daily      . Eszopiclone (ESZOPICLONE) 3 MG TABS Take 1 tablet (3 mg total) by mouth at bedtime. Take immediately before bedtime  90 tablet  3  . levothyroxine (SYNTHROID, LEVOTHROID) 75 MCG tablet Take 1 tablet (75 mcg total) by mouth daily.  90 tablet  3  . loratadine (CLARITIN) 10 MG tablet Take 10 mg by mouth daily.      . Multiple Vitamin (MULTIVITAMIN) tablet Take 1 tablet by mouth daily.         No current facility-administered medications on file prior to visit.    BP 120/70  Pulse 72  Temp(Src) 98.2 F (36.8 C)  Resp 16  Ht 6' (1.829 m)  Wt 151 lb (68.493 kg)  BMI 20.47 kg/m2  Objective:   Physical Exam  Constitutional: She is oriented to person, place, and time. She appears well-developed and well-nourished. No distress.  HENT:  Head: Normocephalic and atraumatic.  Right Ear: External ear normal.  Left Ear: External ear normal.  Nose: Nose normal.  Mouth/Throat: Oropharynx is clear and moist.  Eyes: Conjunctivae and EOM are normal. Pupils are equal, round, and reactive to light.  Neck: Normal range of motion. Neck supple. No JVD present. No tracheal deviation present. No thyromegaly present.  Cardiovascular: Normal rate, regular rhythm and intact distal pulses.   Murmur heard. Pulmonary/Chest: Effort normal  and breath sounds normal. She has no wheezes. She exhibits no tenderness.  Abdominal: Soft. Bowel sounds are normal.  Musculoskeletal: Normal range of motion. She exhibits no edema and no tenderness.  Lymphadenopathy:    She has no cervical adenopathy.  Neurological: She is alert and oriented to person, place, and time. She has normal reflexes. No cranial nerve deficit.  Skin: Skin is warm and dry. She is not diaphoretic.  Psychiatric: She has a normal mood and affect. Her behavior is normal.          Assessment & Plan:  Patient will have appropriate blood levels monitored today vitamin D level Renal function and liver function and lipid panel And a CBC differential for potential side effects of medications she will also have a TSH monitored Subjective:    Nancy [name] is a 77 y.o. female who presents for Medicare Annual/Subsequent preventive examination.  Preventive Screening-Counseling & Management  Tobacco History  Smoking status  . Never Smoker   Smokeless tobacco  . Never Used     Problems Prior to Visit 1.   Current Problems (verified) Patient Active Problem List   Diagnosis Date Noted  . INSOMNIA, CHRONIC 04/14/2008  . THORACIC/LUMBOSACRAL NEURITIS/RADICULITIS UNSPEC 07/14/2007  . BACK PAIN WITH RADICULOPATHY 07/14/2007  . HYPERLIPIDEMIA 04/17/2007  . ADVEF, DRUG/MEDICINAL/BIOLOGICAL SUBST NOS 04/17/2007  . HYPOTHYROIDISM 03/13/2007  . ALLERGIC RHINITIS 03/13/2007  . OSTEOPOROSIS 03/13/2007    Medications Prior to Visit Current Outpatient Prescriptions on File Prior to Visit  Medication Sig Dispense Refill  . Calcium Carbonate-Vitamin D (CALCARB 600/D) 600-400 MG-UNIT per tablet Take 1 tablet by mouth daily.        . Cholecalciferol (VITAMIN D3) 1000 UNITS CAPS Take 1 capsule by mouth daily. Takes 2000 units daily      . Eszopiclone (ESZOPICLONE) 3 MG TABS Take 1 tablet (3 mg total) by mouth at bedtime. Take immediately before bedtime  90 tablet  3  .  levothyroxine (SYNTHROID, LEVOTHROID) 75 MCG tablet Take 1 tablet (75 mcg total) by mouth daily.  90 tablet  3  . loratadine (CLARITIN) 10 MG tablet Take 10 mg by mouth daily.      . Multiple Vitamin (MULTIVITAMIN) tablet Take 1 tablet by mouth daily.         No current facility-administered medications on file prior to visit.    Current Medications (verified) Current Outpatient Prescriptions  Medication Sig Dispense Refill  . Calcium Carbonate-Vitamin D (CALCARB 600/D) 600-400 MG-UNIT per tablet Take 1 tablet by mouth daily.        . Cholecalciferol (VITAMIN D3) 1000 UNITS CAPS Take 1 capsule by mouth daily. Takes 2000 units daily      . Eszopiclone (ESZOPICLONE) 3 MG TABS Take 1 tablet (3 mg total) by mouth at bedtime. Take immediately before bedtime  90 tablet  3  . levothyroxine (SYNTHROID, LEVOTHROID) 75 MCG tablet Take 1  tablet (75 mcg total) by mouth daily.  90 tablet  3  . loratadine (CLARITIN) 10 MG tablet Take 10 mg by mouth daily.      . Multiple Vitamin (MULTIVITAMIN) tablet Take 1 tablet by mouth daily.        . Omega-3 Fatty Acids (FISH OIL) 1000 MG CAPS Take 3 capsules (3,000 mg total) by mouth daily.    0   No current facility-administered medications for this visit.     Allergies (verified) Norco; Penicillins; and Sulfamethoxazole   PAST HISTORY  Family History Family History  Problem Relation Age of Onset  . Dementia Mother   . Colon cancer Mother 42  . Parkinsonism Father   . Osteoporosis    . Colon cancer Sister 56  . Stomach cancer Neg Hx     Social History History  Substance Use Topics  . Smoking status: Never Smoker   . Smokeless tobacco: Never Used  . Alcohol Use: No     Are there smokers in your home (other than you)? No  Risk Factors Current exercise habits: The patient does not participate in regular exercise at present.  Dietary issues discussed: none   Cardiac risk factors: dyslipidemia.  Depression Screen (Note: if answer to either  of the following is "Yes", a more complete depression screening is indicated)   Over the past two weeks, have you felt down, depressed or hopeless? No  Over the past two weeks, have you felt little interest or pleasure in doing things? No  Have you lost interest or pleasure in daily life? No  Do you often feel hopeless? No  Do you cry easily over simple problems? No  Activities of Daily Living In your present state of health, do you have any difficulty performing the following activities?:  Driving? No Managing money?  No Feeding yourself? No Getting from bed to chair? No Climbing a flight of stairs? No Preparing food and eating?: No Bathing or showering? No Getting dressed: No Getting to the toilet? No Using the toilet:No Moving around from place to place: No In the past year have you fallen or had a near fall?:No   Are you sexually active?  No  Do you have more than one partner?  No  Hearing Difficulties: No Do you often ask people to speak up or repeat themselves? No Do you experience ringing or noises in your ears? No Do you have difficulty understanding soft or whispered voices? No   Do you feel that you have a problem with memory? No  Do you often misplace items? No  Do you feel safe at home?  No  Cognitive Testing  Alert? Yes  Normal Appearance?Yes  Oriented to person? Yes  Place? Yes   Time? Yes  Recall of three objects?  Yes  Can perform simple calculations? Yes  Displays appropriate judgment?Yes  Can read the correct time from a watch face?Yes   Advanced Directives have been discussed with the patient? Yes  List the Names of Other Physician/Practitioners you currently use: 1.    Indicate any recent Medical Services you may have received from other than [facility] providers in the past year (date may be approximate).  Immunization History  Administered Date(s) Administered  . Influenza Whole 04/17/2007, 04/06/2010  . Pneumococcal Polysaccharide 04/14/2008  .  Td 06/18/2005, 03/18/2009  . Zoster 04/27/2008    Screening Tests Health Maintenance  Topic Date Due  . Influenza Vaccine  01/16/2013  . Tetanus/tdap  03/19/2019  . Colonoscopy  06/26/2022  . Pneumococcal Polysaccharide Vaccine Age 74 And Over  Completed  . Zostavax  Completed    All answers were reviewed with the patient and necessary referrals were made:  [name], MD   02/23/2013   History reviewed: allergies, current medications, past family history, past medical history, past social history, past surgical history and problem list  Review of Systems Pertinent items are noted in HPI.    Objective:     Vision by Snellen chart: right eye:20/20, left eye:20/20  Body mass index is 20.47 kg/(m^2). BP 120/70  Pulse 72  Temp(Src) 98.2 F (36.8 C)  Resp 16  Ht 6' (1.829 m)  Wt 151 lb (68.493 kg)  BMI 20.47 kg/m2  BP 120/70  Pulse 72  Temp(Src) 98.2 F (36.8 C)  Resp 16  Ht 6' (1.829 m)  Wt 151 lb (68.493 kg)  BMI 20.47 kg/m2  General Appearance:    Alert, cooperative, no distress, appears stated age  Head:    Normocephalic, without obvious abnormality, atraumatic  Eyes:    PERRL, conjunctiva/corneas clear, EOM's intact, fundi    benign, both eyes  Ears:    Normal TM's and external ear canals, both ears  Nose:   Nares normal, septum midline, mucosa normal, no drainage    or sinus tenderness  Throat:   Lips, mucosa, and tongue normal; teeth and gums normal  Neck:   Supple, symmetrical, trachea midline, no adenopathy;    thyroid:  no enlargement/tenderness/nodules; no carotid   bruit or JVD  Back:     Symmetric, no curvature, ROM normal, no CVA tenderness  Lungs:     Clear to auscultation bilaterally, respirations unlabored  Chest Wall:    No tenderness or deformity   Heart:    Regular rate and rhythm, S1 and S2 normal, no murmur, rub   or gallop  Breast Exam:    No tenderness, masses, or nipple abnormality  Abdomen:     Soft, non-tender, bowel sounds  active all four quadrants,    no masses, no organomegaly  Genitalia:    Normal female without lesion, discharge or tenderness  Rectal:    Normal tone, normal prostate, no masses or tenderness;   guaiac negative stool  Extremities:   Extremities normal, atraumatic, no cyanosis or edema  Pulses:   2+ and symmetric all extremities  Skin:   Skin color, texture, turgor normal, no rashes or lesions  Lymph nodes:   Cervical, supraclavicular, and axillary nodes normal  Neurologic:   CNII-XII intact, normal strength, sensation and reflexes    throughout       Assessment:      This is a routine physical examination for this healthy  Female. Reviewed all health maintenance protocols including mammography colonoscopy bone density and reviewed appropriate screening labs. Her immunization history was reviewed as well as her current medications and allergies refills of her chronic medications were given and the plan for yearly health maintenance was discussed all orders and referrals were made as appropriate.      Plan:     During the course of the visit the patient was educated and counseled about appropriate screening and preventive services including:    Influenza vaccine  Diet review for nutrition referral? Yes ____  Not Indicated ____   Patient Instructions (the written plan) was given to the patient.  Medicare Attestation I have personally reviewed: The patient's medical and social history Their use of alcohol, tobacco or illicit drugs Their current medications and supplements  The patient's functional ability including ADLs,fall risks, home safety risks, cognitive, and hearing and visual impairment Diet and physical activities Evidence for depression or mood disorders  The patient's weight, height, BMI, and visual acuity have been recorded in the chart.  I have made referrals, counseling, and provided education to the patient based on review of the above and I have provided the  patient with a written personalized care plan for preventive services.     [name], MD   02/23/2013

## 2013-02-24 DIAGNOSIS — M810 Age-related osteoporosis without current pathological fracture: Secondary | ICD-10-CM | POA: Diagnosis not present

## 2013-02-25 LAB — VITAMIN D 25 HYDROXY (VIT D DEFICIENCY, FRACTURES): Vit D, 25-Hydroxy: 58 ng/mL (ref 30–89)

## 2013-04-15 DIAGNOSIS — Z1231 Encounter for screening mammogram for malignant neoplasm of breast: Secondary | ICD-10-CM | POA: Diagnosis not present

## 2013-04-15 DIAGNOSIS — Z1289 Encounter for screening for malignant neoplasm of other sites: Secondary | ICD-10-CM | POA: Diagnosis not present

## 2013-04-22 DIAGNOSIS — H52209 Unspecified astigmatism, unspecified eye: Secondary | ICD-10-CM | POA: Diagnosis not present

## 2013-04-22 DIAGNOSIS — H2510 Age-related nuclear cataract, unspecified eye: Secondary | ICD-10-CM | POA: Diagnosis not present

## 2013-10-16 ENCOUNTER — Other Ambulatory Visit: Payer: Self-pay | Admitting: Internal Medicine

## 2013-10-26 ENCOUNTER — Ambulatory Visit (INDEPENDENT_AMBULATORY_CARE_PROVIDER_SITE_OTHER): Payer: Medicare Other | Admitting: Internal Medicine

## 2013-10-26 ENCOUNTER — Encounter: Payer: Self-pay | Admitting: Internal Medicine

## 2013-10-26 VITALS — BP 116/80 | HR 72 | Temp 98.2°F | Ht 72.0 in | Wt 160.0 lb

## 2013-10-26 DIAGNOSIS — L255 Unspecified contact dermatitis due to plants, except food: Secondary | ICD-10-CM

## 2013-10-26 DIAGNOSIS — E039 Hypothyroidism, unspecified: Secondary | ICD-10-CM | POA: Diagnosis not present

## 2013-10-26 DIAGNOSIS — L237 Allergic contact dermatitis due to plants, except food: Secondary | ICD-10-CM

## 2013-10-26 LAB — TSH: TSH: 0.22 u[IU]/mL — AB (ref 0.35–4.50)

## 2013-10-26 LAB — T4, FREE: Free T4: 1.09 ng/dL (ref 0.60–1.60)

## 2013-10-26 MED ORDER — METHYLPREDNISOLONE (PAK) 4 MG PO TABS: ORAL_TABLET | ORAL | Status: DC

## 2013-10-26 MED ORDER — ZALEPLON 10 MG PO CAPS
10.0000 mg | ORAL_CAPSULE | Freq: Every evening | ORAL | Status: DC | PRN
Start: 2013-10-26 — End: 2015-05-17

## 2013-10-26 NOTE — Progress Notes (Signed)
Subjective:    Patient ID: Nancy [name], female    DOB: Feb 21, 1936, 78 y.o.   MRN: [id number]  HPI  Replace lunesta The insurance wants to substitue trazadone [place]  Review of Systems  Constitutional: Negative for activity change, appetite change and fatigue.  HENT: Negative for congestion, ear pain, postnasal drip and sinus pressure.   Eyes: Negative for redness and visual disturbance.  Respiratory: Negative for cough, shortness of breath and wheezing.   Gastrointestinal: Negative for abdominal pain and abdominal distention.  Genitourinary: Negative for dysuria, frequency and menstrual problem.  Musculoskeletal: Negative for arthralgias, joint swelling, myalgias and neck pain.  Skin: Negative for rash and wound.  Neurological: Negative for dizziness, weakness and headaches.  Hematological: Negative for adenopathy. Does not bruise/bleed easily.  Psychiatric/Behavioral: Negative for sleep disturbance and decreased concentration.   Past Medical History  Diagnosis Date  . Allergy   . Osteoporosis   . Hypothyroidism     History   Social History  . Marital Status: Married    Spouse Name: N/A    Number of Children: N/A  . Years of Education: N/A   Occupational History  . Not on file.   Social History Main Topics  . Smoking status: Never Smoker   . Smokeless tobacco: Never Used  . Alcohol Use: No  . Drug Use: No  . Sexual Activity: Yes   Other Topics Concern  . Not on file   Social History Narrative  . No narrative on file    Past Surgical History  Procedure Laterality Date  . Abdominal hysterectomy  1975  . Appendectomy    . Tonsillectomy    . Laparoscopy      removal of ovaries  . Removal growth thyroid  1998    Family History  Problem Relation Age of Onset  . Dementia Mother   . Colon cancer Mother 40  . Parkinsonism Father   . Osteoporosis    . Colon cancer Sister 72  . Stomach cancer Neg Hx     Allergies  Allergen Reactions  .  Norco [Hydrocodone-Acetaminophen] Hives  . Penicillins Hives  . Sulfamethoxazole Hives    Current Outpatient Prescriptions on File Prior to Visit  Medication Sig Dispense Refill  . Calcium Carbonate-Vitamin D (CALCARB 600/D) 600-400 MG-UNIT per tablet Take 1 tablet by mouth daily.        . Cholecalciferol (VITAMIN D3) 1000 UNITS CAPS Take 1 capsule by mouth daily. Takes 2000 units daily      . loratadine (CLARITIN) 10 MG tablet Take 10 mg by mouth daily.      . Multiple Vitamin (MULTIVITAMIN) tablet Take 1 tablet by mouth daily.        . Omega-3 Fatty Acids (FISH OIL) 1000 MG CAPS Take 3 capsules (3,000 mg total) by mouth daily.    0  . SYNTHROID 75 MCG tablet Take 1 tablet by mouth  daily  90 tablet  2   No current facility-administered medications on file prior to visit.    BP 116/80  Pulse 72  Temp(Src) 98.2 F (36.8 C) (Oral)  Ht 6' (1.829 m)  Wt 160 lb (72.576 kg)  BMI 21.70 kg/m2       Objective:   Physical Exam  Nursing note and vitals reviewed. Constitutional: She appears well-nourished.  HENT:  Head: Normocephalic and atraumatic.  Eyes: Conjunctivae are normal. Pupils are equal, round, and reactive to light.  Cardiovascular: Normal rate and regular rhythm.   Pulmonary/Chest: Effort normal  and breath sounds normal.  Abdominal: Soft. Bowel sounds are normal.          Assessment & Plan:  Stable thyroid

## 2013-10-26 NOTE — Progress Notes (Signed)
Pre visit review using our clinic review tool, if applicable. No additional management support is needed unless otherwise documented below in the visit note. 

## 2013-10-26 NOTE — Patient Instructions (Signed)
The patient is instructed to continue all medications as prescribed. Schedule followup with check out clerk upon leaving the clinic  

## 2013-11-04 ENCOUNTER — Ambulatory Visit: Payer: Medicare Other | Admitting: Internal Medicine

## 2014-01-27 DIAGNOSIS — L570 Actinic keratosis: Secondary | ICD-10-CM | POA: Diagnosis not present

## 2014-01-28 DIAGNOSIS — L570 Actinic keratosis: Secondary | ICD-10-CM | POA: Diagnosis not present

## 2014-01-28 DIAGNOSIS — D044 Carcinoma in situ of skin of scalp and neck: Secondary | ICD-10-CM | POA: Diagnosis not present

## 2014-03-15 DIAGNOSIS — Z23 Encounter for immunization: Secondary | ICD-10-CM | POA: Diagnosis not present

## 2014-03-29 ENCOUNTER — Encounter: Payer: Self-pay | Admitting: Internal Medicine

## 2014-04-27 DIAGNOSIS — Z1231 Encounter for screening mammogram for malignant neoplasm of breast: Secondary | ICD-10-CM | POA: Diagnosis not present

## 2014-04-27 DIAGNOSIS — L900 Lichen sclerosus et atrophicus: Secondary | ICD-10-CM | POA: Diagnosis not present

## 2014-04-27 DIAGNOSIS — H5203 Hypermetropia, bilateral: Secondary | ICD-10-CM | POA: Diagnosis not present

## 2014-04-27 DIAGNOSIS — H2513 Age-related nuclear cataract, bilateral: Secondary | ICD-10-CM | POA: Diagnosis not present

## 2014-04-29 ENCOUNTER — Encounter: Payer: Self-pay | Admitting: Family Medicine

## 2014-04-29 ENCOUNTER — Ambulatory Visit (INDEPENDENT_AMBULATORY_CARE_PROVIDER_SITE_OTHER): Payer: Medicare Other | Admitting: Family Medicine

## 2014-04-29 VITALS — BP 110/78 | HR 76 | Temp 97.5°F | Ht 72.0 in | Wt 159.5 lb

## 2014-04-29 DIAGNOSIS — G4700 Insomnia, unspecified: Secondary | ICD-10-CM

## 2014-04-29 DIAGNOSIS — E039 Hypothyroidism, unspecified: Secondary | ICD-10-CM | POA: Diagnosis not present

## 2014-04-29 LAB — TSH: TSH: 0.82 u[IU]/mL (ref 0.35–4.50)

## 2014-04-29 MED ORDER — LEVOTHYROXINE SODIUM 75 MCG PO TABS: 75.0000 ug | ORAL_TABLET | Freq: Every day | ORAL | Status: DC

## 2014-04-29 NOTE — Progress Notes (Signed)
Pre visit review using our clinic review tool, if applicable. No additional management support is needed unless otherwise documented below in the visit note. 

## 2014-04-29 NOTE — Addendum Note (Signed)
Addended by: Agnes Lawrence on: 04/29/2014 05:19 PM   Modules accepted: Orders

## 2014-04-29 NOTE — Progress Notes (Signed)
HPI:   Nancy [name] is a prior patient of Dr. Arnoldo Morale here for an acute visit for follow up.   Insomnia: -chronic issues with sleep -she has taken sleep aides long term -she is aware of side effects  Hypothyroidism: -meds: synthroid 75 mg -denies: weight changes, palpitations, constipation, hx of thyroid cancer   ROS: See pertinent positives and negatives per HPI.  Past Medical History  Diagnosis Date  . Allergy   . Osteoporosis   . Hypothyroidism     Past Surgical History  Procedure Laterality Date  . Abdominal hysterectomy  1975  . Appendectomy    . Tonsillectomy    . Laparoscopy      removal of ovaries  . Removal growth thyroid  1998    Family History  Problem Relation Age of Onset  . Dementia Mother   . Colon cancer Mother 79  . Parkinsonism Father   . Osteoporosis    . Colon cancer Sister 75  . Stomach cancer Neg Hx     History   Social History  . Marital Status: Married    Spouse Name: N/A    Number of Children: N/A  . Years of Education: N/A   Social History Main Topics  . Smoking status: Never Smoker   . Smokeless tobacco: Never Used  . Alcohol Use: No  . Drug Use: No  . Sexual Activity: Yes   Other Topics Concern  . None   Social History Narrative    Current outpatient prescriptions: Biotin 10 MG CAPS, Take 1 capsule by mouth daily., Disp: , Rfl: ;  Calcium Carbonate-Vitamin D (CALCARB 600/D) 600-400 MG-UNIT per tablet, Take 1 tablet by mouth daily.  , Disp: , Rfl: ;  Cholecalciferol (VITAMIN D3) 1000 UNITS CAPS, Take 1 capsule by mouth daily. Takes 2000 units daily, Disp: , Rfl: ;  loratadine (CLARITIN) 10 MG tablet, Take 10 mg by mouth daily., Disp: , Rfl:  Multiple Vitamin (MULTIVITAMIN) tablet, Take 1 tablet by mouth daily.  , Disp: , Rfl: ;  Omega-3 Fatty Acids (FISH OIL) 1000 MG CAPS, Take 3 capsules (3,000 mg total) by mouth daily., Disp: , Rfl: 0;  SYNTHROID 75 MCG tablet, Take 1 tablet by mouth  daily, Disp: 90 tablet, Rfl: 2;   zaleplon (SONATA) 10 MG capsule, Take 1 capsule (10 mg total) by mouth at bedtime as needed for sleep., Disp: 30 capsule, Rfl: 2  EXAM:  Filed Vitals:   04/29/14 1007  BP: 110/78  Pulse: 76  Temp: 97.5 F (36.4 C)    Body mass index is 21.63 kg/(m^2).  GENERAL: vitals reviewed and listed above, alert, oriented, appears well hydrated and in no acute distress  HEENT: atraumatic, conjunttiva clear, no obvious abnormalities on inspection of external nose and ears  NECK: no obvious masses on inspection  LUNGS: clear to auscultation bilaterally, no wheezes, rales or rhonchi, good air movement  CV: HRRR, no peripheral edema  MS: moves all extremities without noticeable abnormality  PSYCH: pleasant and cooperative, no obvious depression or anxiety  ASSESSMENT AND PLAN:  Discussed the following assessment and plan:  Hypothyroidism, unspecified hypothyroidism type - Plan: TSH -adjust dose if needed and refill pending labs  Insomnia: -discussed treatment options and I advised her that I do not prescribe prescription sleep aides for regular use in adult patients. Advised she reduce and try to stop using this. I advised I will only rx for rare use if other measures failing.  -Patient advised to return or notify a  doctor immediately if symptoms worsen or persist or new concerns arise.  Patient Instructions  -We have ordered labs or studies at this visit. It can take up to 1-2 weeks for results and processing. We will contact you with instructions IF your results are abnormal. Normal results will be released to your [facility]. If you have not heard from [place] or can not find your results in [facility] in 2 weeks please contact our office.  We recommend the following healthy lifestyle measures: - eat a healthy diet consisting of lots of vegetables, fruits, beans, nuts, seeds, healthy meats such as white chicken and fish and whole grains.  - avoid fried foods, fast food, processed foods, sodas,  red meet and other fattening foods.  - get a least 150 minutes of aerobic exercise per week.   FOR IMPROVED SLEEP AND TO RESET YOUR SLEEP SCHEDULE: []  exercise 30 minutes daily  []  go to bed and wake up at the same time  []  keep bedroom cool, dark and quiet  []  reserve bed for sleep - do not read, watch TV, etc in bed  []  If you toss and turn more then 15-20 minutes get out of bed and list thoughts/do quite activity then go back to bed; repeat as needed; do not worry about when you eventually fall asleep - still get up at the same time and turn on lights and take shower  [] get counseling  []  some people find that a half dose of benadryl, melatonin, tylenol pm or unisom on a few nights per week is helpful initially for a few weeks  [] seek help and treat any depression or anxiety  [] prescription strength sleep medications should only be used in severe cases of insomnia if other measures fail and should be used sparingly      KIM, Jarrett Soho R.

## 2014-04-29 NOTE — Patient Instructions (Signed)
-  We have ordered labs or studies at this visit. It can take up to 1-2 weeks for results and processing. We will contact you with instructions IF your results are abnormal. Normal results will be released to your [facility]. If you have not heard from [place] or can not find your results in [facility] in 2 weeks please contact our office.  We recommend the following healthy lifestyle measures: - eat a healthy diet consisting of lots of vegetables, fruits, beans, nuts, seeds, healthy meats such as white chicken and fish and whole grains.  - avoid fried foods, fast food, processed foods, sodas, red meet and other fattening foods.  - get a least 150 minutes of aerobic exercise per week.   FOR IMPROVED SLEEP AND TO RESET YOUR SLEEP SCHEDULE: []  exercise 30 minutes daily  []  go to bed and wake up at the same time  []  keep bedroom cool, dark and quiet  []  reserve bed for sleep - do not read, watch TV, etc in bed  []  If you toss and turn more then 15-20 minutes get out of bed and list thoughts/do quite activity then go back to bed; repeat as needed; do not worry about when you eventually fall asleep - still get up at the same time and turn on lights and take shower  [] get counseling  []  some people find that a half dose of benadryl, melatonin, tylenol pm or unisom on a few nights per week is helpful initially for a few weeks  [] seek help and treat any depression or anxiety  [] prescription strength sleep medications should only be used in severe cases of insomnia if other measures fail and should be used sparingly

## 2014-05-18 DIAGNOSIS — L570 Actinic keratosis: Secondary | ICD-10-CM | POA: Diagnosis not present

## 2014-05-20 ENCOUNTER — Emergency Department (HOSPITAL_COMMUNITY): Payer: Medicare Other

## 2014-05-20 ENCOUNTER — Encounter (HOSPITAL_COMMUNITY): Payer: Self-pay | Admitting: Emergency Medicine

## 2014-05-20 ENCOUNTER — Emergency Department (HOSPITAL_COMMUNITY)
Admission: EM | Admit: 2014-05-20 | Discharge: 2014-05-20 | Disposition: A | Discharge status: Home / Self Care | Payer: Medicare Other | Attending: Emergency Medicine | Admitting: Emergency Medicine

## 2014-05-20 DIAGNOSIS — S42301A Unspecified fracture of shaft of humerus, right arm, initial encounter for closed fracture: Secondary | ICD-10-CM | POA: Diagnosis not present

## 2014-05-20 DIAGNOSIS — S42291A Other displaced fracture of upper end of right humerus, initial encounter for closed fracture: Secondary | ICD-10-CM | POA: Diagnosis not present

## 2014-05-20 DIAGNOSIS — Y998 Other external cause status: Secondary | ICD-10-CM | POA: Insufficient documentation

## 2014-05-20 DIAGNOSIS — W1839XA Other fall on same level, initial encounter: Secondary | ICD-10-CM | POA: Diagnosis not present

## 2014-05-20 DIAGNOSIS — Z79899 Other long term (current) drug therapy: Secondary | ICD-10-CM | POA: Insufficient documentation

## 2014-05-20 DIAGNOSIS — Y9289 Other specified places as the place of occurrence of the external cause: Secondary | ICD-10-CM | POA: Insufficient documentation

## 2014-05-20 DIAGNOSIS — S4991XA Unspecified injury of right shoulder and upper arm, initial encounter: Secondary | ICD-10-CM | POA: Diagnosis present

## 2014-05-20 DIAGNOSIS — W19XXXA Unspecified fall, initial encounter: Secondary | ICD-10-CM

## 2014-05-20 DIAGNOSIS — Z880 Allergy status to penicillin: Secondary | ICD-10-CM | POA: Insufficient documentation

## 2014-05-20 DIAGNOSIS — E039 Hypothyroidism, unspecified: Secondary | ICD-10-CM | POA: Insufficient documentation

## 2014-05-20 DIAGNOSIS — Y9389 Activity, other specified: Secondary | ICD-10-CM | POA: Insufficient documentation

## 2014-05-20 DIAGNOSIS — S299XXA Unspecified injury of thorax, initial encounter: Secondary | ICD-10-CM | POA: Diagnosis not present

## 2014-05-20 DIAGNOSIS — S42211A Unspecified displaced fracture of surgical neck of right humerus, initial encounter for closed fracture: Secondary | ICD-10-CM | POA: Diagnosis not present

## 2014-05-20 LAB — CBC
HCT: 33.1 % — ABNORMAL LOW (ref 36.0–46.0)
Hemoglobin: 11.2 g/dL — ABNORMAL LOW (ref 12.0–15.0)
MCH: 30.4 pg (ref 26.0–34.0)
MCHC: 33.8 g/dL (ref 30.0–36.0)
MCV: 89.7 fL (ref 78.0–100.0)
PLATELETS: 222 10*3/uL (ref 150–400)
RBC: 3.69 MIL/uL — ABNORMAL LOW (ref 3.87–5.11)
RDW: 12.9 % (ref 11.5–15.5)
WBC: 8.4 10*3/uL (ref 4.0–10.5)

## 2014-05-20 LAB — BASIC METABOLIC PANEL
Anion gap: 16 — ABNORMAL HIGH (ref 5–15)
BUN: 27 mg/dL — AB (ref 6–23)
CO2: 20 mEq/L (ref 19–32)
CREATININE: 0.86 mg/dL (ref 0.50–1.10)
Calcium: 9.3 mg/dL (ref 8.4–10.5)
Chloride: 102 mEq/L (ref 96–112)
GFR, EST AFRICAN AMERICAN: 74 mL/min — AB (ref >90)
GFR, EST NON AFRICAN AMERICAN: 64 mL/min — AB (ref >90)
Glucose, Bld: 102 mg/dL — ABNORMAL HIGH (ref 70–99)
POTASSIUM: 4 meq/L (ref 3.7–5.3)
Sodium: 138 mEq/L (ref 137–147)

## 2014-05-20 MED ORDER — ONDANSETRON HCL 4 MG/2ML IJ SOLN
4.0000 mg | Freq: Once | INTRAMUSCULAR | Status: AC
  Administered 2014-05-20: 4 mg via INTRAVENOUS

## 2014-05-20 MED ORDER — FENTANYL CITRATE 0.05 MG/ML IJ SOLN
25.0000 ug | Freq: Once | INTRAMUSCULAR | Status: AC
  Administered 2014-05-20: 25 ug via INTRAVENOUS
  Filled 2014-05-20: qty 2

## 2014-05-20 MED ORDER — MORPHINE SULFATE 4 MG/ML IJ SOLN
4.0000 mg | Freq: Once | INTRAMUSCULAR | Status: AC
Start: 2014-05-20 — End: 2014-05-20
  Administered 2014-05-20: 4 mg via INTRAVENOUS
  Filled 2014-05-20: qty 1

## 2014-05-20 MED ORDER — OXYCODONE-ACETAMINOPHEN 5-325 MG PO TABS: 2.0000 | ORAL_TABLET | ORAL | Status: DC | PRN

## 2014-05-20 MED ORDER — SODIUM CHLORIDE 0.9 % IV BOLUS (SEPSIS)
1000.0000 mL | Freq: Once | INTRAVENOUS | Status: AC
  Administered 2014-05-20: 1000 mL via INTRAVENOUS

## 2014-05-20 MED ORDER — MORPHINE SULFATE 4 MG/ML IJ SOLN
4.0000 mg | Freq: Once | INTRAMUSCULAR | Status: AC
  Administered 2014-05-20: 4 mg via INTRAVENOUS
  Filled 2014-05-20: qty 1

## 2014-05-20 NOTE — ED Notes (Signed)
Pt c/o right shoulder and arm pain after trip and fall today; CMS intact

## 2014-05-20 NOTE — ED Notes (Signed)
Pt requesting more pain medication after coming back from Xray.  Provider made aware.  Awaiting orders.

## 2014-05-20 NOTE — ED Notes (Signed)
Liter of fluid to be given prior to discharge.

## 2014-05-20 NOTE — Progress Notes (Signed)
Orthopedic Tech Progress Note Patient Details:  Nancy [name] 04/24/1936 [id number]  Ortho Devices Type of Ortho Device: Sling immobilizer Ortho Device/Splint Location: RUE Ortho Device/Splint Interventions: Ordered, Application   Braulio Bosch 05/20/2014, 6:41 PM

## 2014-05-20 NOTE — ED Notes (Signed)
Pt remains monitored by blood pressure, pulse ox, and 5 lead.  

## 2014-05-20 NOTE — Discharge Instructions (Signed)
Fall Prevention and Home Safety Falls cause injuries and can affect all age groups. It is possible to use preventive measures to significantly decrease the likelihood of falls. There are many simple measures which can make your home safer and prevent falls. OUTDOORS  Repair cracks and edges of walkways and driveways.  Remove high doorway thresholds.  Trim shrubbery on the main path into your home.  Have good outside lighting.  Clear walkways of tools, rocks, debris, and clutter.  Check that handrails are not broken and are securely fastened. Both sides of steps should have handrails.  Have leaves, snow, and ice cleared regularly.  Use sand or salt on walkways during winter months.  In the garage, clean up grease or oil spills. BATHROOM  Install night lights.  Install grab bars by the toilet and in the tub and shower.  Use non-skid mats or decals in the tub or shower.  Place a plastic non-slip stool in the shower to sit on, if needed.  Keep floors dry and clean up all water on the floor immediately.  Remove soap buildup in the tub or shower on a regular basis.  Secure bath mats with non-slip, double-sided rug tape.  Remove throw rugs and tripping hazards from the floors. BEDROOMS  Install night lights.  Make sure a bedside light is easy to reach.  Do not use oversized bedding.  Keep a telephone by your bedside.  Have a firm chair with side arms to use for getting dressed.  Remove throw rugs and tripping hazards from the floor. KITCHEN  Keep handles on pots and pans turned toward the center of the stove. Use back burners when possible.  Clean up spills quickly and allow time for drying.  Avoid walking on wet floors.  Avoid hot utensils and knives.  Position shelves so they are not too high or low.  Place commonly used objects within easy reach.  If necessary, use a sturdy step stool with a grab bar when reaching.  Keep electrical cables out of the  way.  Do not use floor polish or wax that makes floors slippery. If you must use wax, use non-skid floor wax.  Remove throw rugs and tripping hazards from the floor. STAIRWAYS  Never leave objects on stairs.  Place handrails on both sides of stairways and use them. Fix any loose handrails. Make sure handrails on both sides of the stairways are as long as the stairs.  Check carpeting to make sure it is firmly attached along stairs. Make repairs to worn or loose carpet promptly.  Avoid placing throw rugs at the top or bottom of stairways, or properly secure the rug with carpet tape to prevent slippage. Get rid of throw rugs, if possible.  Have an electrician put in a light switch at the top and bottom of the stairs. OTHER FALL PREVENTION TIPS  Wear low-heel or rubber-soled shoes that are supportive and fit well. Wear closed toe shoes.  When using a stepladder, make sure it is fully opened and both spreaders are firmly locked. Do not climb a closed stepladder.  Add color or contrast paint or tape to grab bars and handrails in your home. Place contrasting color strips on first and last steps.  Learn and use mobility aids as needed. Install an electrical emergency response system.  Turn on lights to avoid dark areas. Replace light bulbs that burn out immediately. Get light switches that glow.  Arrange furniture to create clear pathways. Keep furniture in the same place.  Firmly attach carpet with non-skid or double-sided tape. °· Eliminate uneven floor surfaces. °· Select a carpet pattern that does not visually hide the edge of steps. °· Be aware of all pets. °OTHER HOME SAFETY TIPS °· Set the water temperature for 120° F (48.8° C). °· Keep emergency numbers on or near the telephone. °· Keep smoke detectors on every level of the home and near sleeping areas. °Document Released: 05/25/2002 Document Revised: 12/04/2011 Document Reviewed: 08/24/2011 °[employer]® Patient Information ©2015  [employer]. This information is not intended to replace advice given to you by your health care provider. Make sure you discuss any questions you have with your health care provider. ° °Humerus Fracture, Treated with Immobilization °The humerus is the large bone in the upper arm. A broken (fractured) humerus is often treated by wearing a cast, splint, or sling (immobilization). This holds the broken pieces in place so they can heal.  °HOME CARE °· Put ice on the injured area. °¨ Put ice in a plastic bag. °¨ Place a towel between your skin and the bag. °¨ Leave the ice on for 15-20 minutes, 03-04 times a day. °· If you are given a cast: °¨ Do not scratch the skin under the cast. °¨ Check the skin around the cast every day. You may put lotion on any red or sore areas. °¨ Keep the cast dry and clean. °· If you are given a splint: °¨ Wear the splint as told. °¨ Keep the splint clean and dry. °¨ Loosen the elastic around the splint if your fingers become numb, cold, tingle, or turn blue. °· If you are given a sling: °¨ Wear the sling as told. °· Do not put pressure on any part of the cast or splint until it is fully hardened. °· The cast or splint must be protected with a plastic bag during bathing. Do not lower the cast or splint into water. °· Only take medicine as told by your doctor. °· Do exercises as told by your doctor. °· Follow up as told by your doctor. °GET HELP RIGHT AWAY IF:  °· Your skin or fingernails turn blue or gray. °· Your arm feels cold or numb. °· You have very bad pain in the injured arm. °· You are having problems with the medicines you were given. °MAKE SURE YOU:  °· Understand these instructions. °· Will watch your condition. °· Will get help right away if you are not doing well or get worse. °Document Released: 11/21/2007 Document Revised: 08/27/2011 Document Reviewed: 07/19/2010 °[employer]® Patient Information ©2015 [employer]. This information is not intended to replace advice given to  you by your health care provider. Make sure you discuss any questions you have with your health care provider. ° °

## 2014-05-20 NOTE — ED Notes (Signed)
Pt placed on monitor upon arrival to room. Pt monitored by blood pressure, pulse ox, and 5 lead.  

## 2014-05-20 NOTE — ED Notes (Signed)
Patient feeling nauseated at this time.  MD notified. New orders per Dr Jeanell Sparrow.

## 2014-05-20 NOTE — ED Provider Notes (Signed)
CSN: [id number]     Arrival date & time 05/20/14  1554 History   First MD Initiated Contact with Patient 05/20/14 1609     Chief Complaint  Patient presents with  . Shoulder Injury     (Consider location/radiation/quality/duration/timing/severity/associated sxs/prior Treatment) HPI Patient is a 78 year old female who presents following a mechanical fall. She was gardening outside when she tripped over her lawnmower and fell landing directly on her right shoulder. This occurred at approximately 2:00 PM. She immediately felt significant pain, but has been worsening over the past several hours. She is been unable to flex her arm at the elbow due to pain. She denies hitting her head, her neck, losing consciousness or any pain anywhere else. She did not have any symptoms suggestive of syncope. She currently complains of 9 out of 10 pain in her right upper arm. She denies any numbness or weakness of her hand.   Past Medical History  Diagnosis Date  . Allergy   . Osteoporosis   . Hypothyroidism    Past Surgical History  Procedure Laterality Date  . Abdominal hysterectomy  1975  . Appendectomy    . Tonsillectomy    . Laparoscopy      removal of ovaries  . Removal growth thyroid  1998   Family History  Problem Relation Age of Onset  . Dementia Mother   . Colon cancer Mother 50  . Parkinsonism Father   . Osteoporosis    . Colon cancer Sister 36  . Stomach cancer Neg Hx    History  Substance Use Topics  . Smoking status: Never Smoker   . Smokeless tobacco: Never Used  . Alcohol Use: No   OB History    No data available     Review of Systems  Constitutional: Negative for fever and fatigue.  Musculoskeletal: Positive for myalgias, joint swelling and arthralgias. Negative for back pain and neck pain.  Neurological: Negative for dizziness, syncope and light-headedness.  All other systems reviewed and are negative.     Allergies  Norco; Penicillins; and  Sulfamethoxazole  Home Medications   Prior to Admission medications   Medication Sig Start Date End Date Taking? Authorizing Provider  Biotin 10 MG CAPS Take 1 capsule by mouth daily.   Yes Historical Provider, MD  Calcium Carbonate-Vitamin D (CALCARB 600/D) 600-400 MG-UNIT per tablet Take 1 tablet by mouth daily.     Yes Historical Provider, MD  Cholecalciferol (VITAMIN D3) 1000 UNITS CAPS Take 1 capsule by mouth daily. Takes 2000 units daily   Yes Historical Provider, MD  eszopiclone (LUNESTA) 1 MG TABS tablet Take 1 mg by mouth at bedtime as needed for sleep. Take immediately before bedtime   Yes Historical Provider, MD  levothyroxine (SYNTHROID) 75 MCG tablet Take 1 tablet (75 mcg total) by mouth daily before breakfast. 04/29/14  Yes Lucretia Kern, DO  MELATONIN PO Take 1 tablet by mouth at bedtime as needed (sleep).   Yes Historical Provider, MD  Multiple Vitamin (MULTIVITAMIN) tablet Take 1 tablet by mouth daily.     Yes Historical Provider, MD  Omega-3 Fatty Acids (FISH OIL) 1000 MG CAPS Take 3 capsules (3,000 mg total) by mouth daily. 02/23/13  Yes Ricard Dillon, MD  zaleplon (SONATA) 10 MG capsule Take 1 capsule (10 mg total) by mouth at bedtime as needed for sleep. 10/26/13  Yes Ricard Dillon, MD   BP 110/57 mmHg  Pulse 66  Temp(Src) 97.6 F (36.4 C)  Resp 15  SpO2  99% Physical Exam  Constitutional: She is oriented to person, place, and time. She appears well-developed and well-nourished.  HENT:  Head: Normocephalic and atraumatic.  Eyes: EOM are normal. Pupils are equal, round, and reactive to light.  Neck: Normal range of motion.  No tenderness or deformity to the cervical spine  Cardiovascular: Normal rate, regular rhythm and normal heart sounds.   Pulmonary/Chest: Effort normal and breath sounds normal. No respiratory distress.  Abdominal: Soft. She exhibits no distension. There is no tenderness.  Musculoskeletal:  Tenderness to palpation along the right proximal humerus,  no obvious deformity. Unable to flex her forearm at the elbow due to pain. Neurologically intact in all nerve distributions in the hand, normal pulses distal to her injury. No tenderness to the wrist or elbow No tenderness to the clavicle or scapula. No chest wall tenderness, no tenderness or deformity to the thoracic or lumbar spine. Hips nontender  Neurological: She is alert and oriented to person, place, and time.  Skin: Skin is warm and dry.  Psychiatric: She has a normal mood and affect.  Nursing note and vitals reviewed.   ED Course  Procedures (including critical care time) Labs Review Labs Reviewed  BASIC METABOLIC PANEL - Abnormal; Notable for the following:    Glucose, Bld 102 (*)    BUN 27 (*)    GFR calc non Af Amer 64 (*)    GFR calc Af Amer 74 (*)    Anion gap 16 (*)    All other components within normal limits  CBC - Abnormal; Notable for the following:    RBC 3.69 (*)    Hemoglobin 11.2 (*)    HCT 33.1 (*)    All other components within normal limits    Imaging Review Dg Chest 1 View  05/20/2014   CLINICAL DATA:  Fall from standing, initial encounter.  EXAM: CHEST - 1 VIEW  COMPARISON:  None.  FINDINGS: The heart size and mediastinal contours are within normal limits. Both lungs are clear. No pneumothorax or pleural effusion is noted. The visualized skeletal structures are unremarkable.  IMPRESSION: No acute cardiopulmonary abnormality seen.   Electronically Signed   By: Sabino Dick M.D.   On: 05/20/2014 17:50   Dg Humerus Right  05/20/2014   CLINICAL DATA:  Status post fall from standing  EXAM: RIGHT HUMERUS - 2+ VIEW  COMPARISON:  None.  FINDINGS: There is a comminuted fracture of the surgical neck of the right proximal humerus with involvement of the greater tuberosity. There is mild medial displacement. There is no glenohumeral dislocation. The acromioclavicular joint is normal.  IMPRESSION: Comminuted fracture of the surgical neck of the right proximal humerus.    Electronically Signed   By: Kathreen Devoid   On: 05/20/2014 17:48     EKG Interpretation None      MDM   Final diagnoses:  Fracture, humerus, right, closed, initial encounter    Otherwise healthy 78 year old female presents with right humerus fracture. Patient suffered a mechanical fall, no indication that this was a syncopal episode. She has an intact neurologic exam of her right hand, with good strength and sensation in all III nerve distributions. She has good radial pulse and capillary refill to all of her fingers.  X-ray was obtained and shows a comminuted fracture of the surgical neck proximal humerus. Chest x-ray did not show any injury to the clavicle or scapula or chest wall, no pneumothorax was seen. Patient had a benign physical exam, with  no other signs of trauma about the head and C-spine neck chest abdomen or pelvis. She required multiple doses of pain medication to control her pain, but after being placed in an immobilizer, pain was much better. She did have a brief period of relative hypotension which responded to IV fluids, likely this was secondary to the large amount pain medication she received so she would be able to tolerate her x-rays.  I believe she would be safe for discharge home, she has a neighbor who will be staying with her overnight tonight, and a sister who lives in [place] who will be coming to stay with her tomorrow morning. She plans to call her orthopedic doctor, Dr. Percell Miller, tomorrow morning for follow-up as an outpatient.  Leata Mouse, MD 05/20/14 2119  Shaune Pollack, MD 05/22/14 [phone]

## 2014-05-21 DIAGNOSIS — S42302A Unspecified fracture of shaft of humerus, left arm, initial encounter for closed fracture: Secondary | ICD-10-CM | POA: Diagnosis not present

## 2014-05-25 DIAGNOSIS — S42302A Unspecified fracture of shaft of humerus, left arm, initial encounter for closed fracture: Secondary | ICD-10-CM | POA: Diagnosis not present

## 2014-06-08 DIAGNOSIS — S42302D Unspecified fracture of shaft of humerus, left arm, subsequent encounter for fracture with routine healing: Secondary | ICD-10-CM | POA: Diagnosis not present

## 2014-06-22 DIAGNOSIS — S42302D Unspecified fracture of shaft of humerus, left arm, subsequent encounter for fracture with routine healing: Secondary | ICD-10-CM | POA: Diagnosis not present

## 2014-06-23 ENCOUNTER — Telehealth: Payer: Self-pay

## 2014-06-23 MED ORDER — LEVOTHYROXINE SODIUM 75 MCG PO TABS: 75.0000 ug | ORAL_TABLET | Freq: Every day | ORAL | Status: DC

## 2014-06-23 NOTE — Telephone Encounter (Signed)
Rx request for synthroid 75 mcg.    Pharm:  [facility]  Pls advise.

## 2014-06-23 NOTE — Telephone Encounter (Signed)
Rx done. 

## 2014-07-13 DIAGNOSIS — M810 Age-related osteoporosis without current pathological fracture: Secondary | ICD-10-CM | POA: Diagnosis not present

## 2014-07-13 DIAGNOSIS — E559 Vitamin D deficiency, unspecified: Secondary | ICD-10-CM | POA: Diagnosis not present

## 2014-07-13 DIAGNOSIS — S42302D Unspecified fracture of shaft of humerus, left arm, subsequent encounter for fracture with routine healing: Secondary | ICD-10-CM | POA: Diagnosis not present

## 2014-07-13 DIAGNOSIS — R5383 Other fatigue: Secondary | ICD-10-CM | POA: Diagnosis not present

## 2014-08-05 DIAGNOSIS — M810 Age-related osteoporosis without current pathological fracture: Secondary | ICD-10-CM | POA: Diagnosis not present

## 2014-08-05 DIAGNOSIS — E559 Vitamin D deficiency, unspecified: Secondary | ICD-10-CM | POA: Diagnosis not present

## 2014-08-10 DIAGNOSIS — S42302D Unspecified fracture of shaft of humerus, left arm, subsequent encounter for fracture with routine healing: Secondary | ICD-10-CM | POA: Diagnosis not present

## 2014-08-10 DIAGNOSIS — M810 Age-related osteoporosis without current pathological fracture: Secondary | ICD-10-CM | POA: Diagnosis not present

## 2014-08-10 DIAGNOSIS — E559 Vitamin D deficiency, unspecified: Secondary | ICD-10-CM | POA: Diagnosis not present

## 2014-08-20 DIAGNOSIS — E559 Vitamin D deficiency, unspecified: Secondary | ICD-10-CM | POA: Diagnosis not present

## 2014-08-20 DIAGNOSIS — M810 Age-related osteoporosis without current pathological fracture: Secondary | ICD-10-CM | POA: Diagnosis not present

## 2014-08-24 DIAGNOSIS — S42302D Unspecified fracture of shaft of humerus, left arm, subsequent encounter for fracture with routine healing: Secondary | ICD-10-CM | POA: Diagnosis not present

## 2014-08-30 DIAGNOSIS — M25512 Pain in left shoulder: Secondary | ICD-10-CM | POA: Diagnosis not present

## 2014-08-30 DIAGNOSIS — M6281 Muscle weakness (generalized): Secondary | ICD-10-CM | POA: Diagnosis not present

## 2014-08-30 DIAGNOSIS — M25612 Stiffness of left shoulder, not elsewhere classified: Secondary | ICD-10-CM | POA: Diagnosis not present

## 2014-08-30 DIAGNOSIS — S42201D Unspecified fracture of upper end of right humerus, subsequent encounter for fracture with routine healing: Secondary | ICD-10-CM | POA: Diagnosis not present

## 2014-09-02 DIAGNOSIS — S42201D Unspecified fracture of upper end of right humerus, subsequent encounter for fracture with routine healing: Secondary | ICD-10-CM | POA: Diagnosis not present

## 2014-09-02 DIAGNOSIS — M6281 Muscle weakness (generalized): Secondary | ICD-10-CM | POA: Diagnosis not present

## 2014-09-02 DIAGNOSIS — M25512 Pain in left shoulder: Secondary | ICD-10-CM | POA: Diagnosis not present

## 2014-09-02 DIAGNOSIS — M25612 Stiffness of left shoulder, not elsewhere classified: Secondary | ICD-10-CM | POA: Diagnosis not present

## 2014-09-06 DIAGNOSIS — M25512 Pain in left shoulder: Secondary | ICD-10-CM | POA: Diagnosis not present

## 2014-09-06 DIAGNOSIS — S42201D Unspecified fracture of upper end of right humerus, subsequent encounter for fracture with routine healing: Secondary | ICD-10-CM | POA: Diagnosis not present

## 2014-09-06 DIAGNOSIS — M25612 Stiffness of left shoulder, not elsewhere classified: Secondary | ICD-10-CM | POA: Diagnosis not present

## 2014-09-06 DIAGNOSIS — M6281 Muscle weakness (generalized): Secondary | ICD-10-CM | POA: Diagnosis not present

## 2014-09-09 DIAGNOSIS — M6281 Muscle weakness (generalized): Secondary | ICD-10-CM | POA: Diagnosis not present

## 2014-09-09 DIAGNOSIS — M25512 Pain in left shoulder: Secondary | ICD-10-CM | POA: Diagnosis not present

## 2014-09-09 DIAGNOSIS — S42201D Unspecified fracture of upper end of right humerus, subsequent encounter for fracture with routine healing: Secondary | ICD-10-CM | POA: Diagnosis not present

## 2014-09-09 DIAGNOSIS — M25612 Stiffness of left shoulder, not elsewhere classified: Secondary | ICD-10-CM | POA: Diagnosis not present

## 2014-09-20 DIAGNOSIS — M25512 Pain in left shoulder: Secondary | ICD-10-CM | POA: Diagnosis not present

## 2014-09-20 DIAGNOSIS — M6281 Muscle weakness (generalized): Secondary | ICD-10-CM | POA: Diagnosis not present

## 2014-09-20 DIAGNOSIS — S42201D Unspecified fracture of upper end of right humerus, subsequent encounter for fracture with routine healing: Secondary | ICD-10-CM | POA: Diagnosis not present

## 2014-09-20 DIAGNOSIS — M25612 Stiffness of left shoulder, not elsewhere classified: Secondary | ICD-10-CM | POA: Diagnosis not present

## 2014-09-21 DIAGNOSIS — S42201D Unspecified fracture of upper end of right humerus, subsequent encounter for fracture with routine healing: Secondary | ICD-10-CM | POA: Diagnosis not present

## 2014-09-23 DIAGNOSIS — M25612 Stiffness of left shoulder, not elsewhere classified: Secondary | ICD-10-CM | POA: Diagnosis not present

## 2014-09-23 DIAGNOSIS — M25512 Pain in left shoulder: Secondary | ICD-10-CM | POA: Diagnosis not present

## 2014-09-23 DIAGNOSIS — M6281 Muscle weakness (generalized): Secondary | ICD-10-CM | POA: Diagnosis not present

## 2014-09-23 DIAGNOSIS — S42201D Unspecified fracture of upper end of right humerus, subsequent encounter for fracture with routine healing: Secondary | ICD-10-CM | POA: Diagnosis not present

## 2014-09-27 DIAGNOSIS — M25512 Pain in left shoulder: Secondary | ICD-10-CM | POA: Diagnosis not present

## 2014-09-27 DIAGNOSIS — S42201D Unspecified fracture of upper end of right humerus, subsequent encounter for fracture with routine healing: Secondary | ICD-10-CM | POA: Diagnosis not present

## 2014-09-27 DIAGNOSIS — M25612 Stiffness of left shoulder, not elsewhere classified: Secondary | ICD-10-CM | POA: Diagnosis not present

## 2014-09-27 DIAGNOSIS — M6281 Muscle weakness (generalized): Secondary | ICD-10-CM | POA: Diagnosis not present

## 2014-09-28 DIAGNOSIS — L570 Actinic keratosis: Secondary | ICD-10-CM | POA: Diagnosis not present

## 2014-09-30 DIAGNOSIS — M6281 Muscle weakness (generalized): Secondary | ICD-10-CM | POA: Diagnosis not present

## 2014-09-30 DIAGNOSIS — M25612 Stiffness of left shoulder, not elsewhere classified: Secondary | ICD-10-CM | POA: Diagnosis not present

## 2014-09-30 DIAGNOSIS — S42201D Unspecified fracture of upper end of right humerus, subsequent encounter for fracture with routine healing: Secondary | ICD-10-CM | POA: Diagnosis not present

## 2014-09-30 DIAGNOSIS — M25512 Pain in left shoulder: Secondary | ICD-10-CM | POA: Diagnosis not present

## 2014-10-04 DIAGNOSIS — M25612 Stiffness of left shoulder, not elsewhere classified: Secondary | ICD-10-CM | POA: Diagnosis not present

## 2014-10-04 DIAGNOSIS — M6281 Muscle weakness (generalized): Secondary | ICD-10-CM | POA: Diagnosis not present

## 2014-10-04 DIAGNOSIS — M25512 Pain in left shoulder: Secondary | ICD-10-CM | POA: Diagnosis not present

## 2014-10-04 DIAGNOSIS — S42201D Unspecified fracture of upper end of right humerus, subsequent encounter for fracture with routine healing: Secondary | ICD-10-CM | POA: Diagnosis not present

## 2014-10-07 DIAGNOSIS — M6281 Muscle weakness (generalized): Secondary | ICD-10-CM | POA: Diagnosis not present

## 2014-10-07 DIAGNOSIS — M25512 Pain in left shoulder: Secondary | ICD-10-CM | POA: Diagnosis not present

## 2014-10-07 DIAGNOSIS — S42201D Unspecified fracture of upper end of right humerus, subsequent encounter for fracture with routine healing: Secondary | ICD-10-CM | POA: Diagnosis not present

## 2014-10-07 DIAGNOSIS — M25612 Stiffness of left shoulder, not elsewhere classified: Secondary | ICD-10-CM | POA: Diagnosis not present

## 2014-10-11 DIAGNOSIS — M25512 Pain in left shoulder: Secondary | ICD-10-CM | POA: Diagnosis not present

## 2014-10-11 DIAGNOSIS — M6281 Muscle weakness (generalized): Secondary | ICD-10-CM | POA: Diagnosis not present

## 2014-10-11 DIAGNOSIS — S42201D Unspecified fracture of upper end of right humerus, subsequent encounter for fracture with routine healing: Secondary | ICD-10-CM | POA: Diagnosis not present

## 2014-10-11 DIAGNOSIS — M25612 Stiffness of left shoulder, not elsewhere classified: Secondary | ICD-10-CM | POA: Diagnosis not present

## 2014-10-14 DIAGNOSIS — M25612 Stiffness of left shoulder, not elsewhere classified: Secondary | ICD-10-CM | POA: Diagnosis not present

## 2014-10-14 DIAGNOSIS — M6281 Muscle weakness (generalized): Secondary | ICD-10-CM | POA: Diagnosis not present

## 2014-10-14 DIAGNOSIS — S42201D Unspecified fracture of upper end of right humerus, subsequent encounter for fracture with routine healing: Secondary | ICD-10-CM | POA: Diagnosis not present

## 2014-10-14 DIAGNOSIS — M25512 Pain in left shoulder: Secondary | ICD-10-CM | POA: Diagnosis not present

## 2014-10-18 DIAGNOSIS — S42201D Unspecified fracture of upper end of right humerus, subsequent encounter for fracture with routine healing: Secondary | ICD-10-CM | POA: Diagnosis not present

## 2014-10-18 DIAGNOSIS — M25612 Stiffness of left shoulder, not elsewhere classified: Secondary | ICD-10-CM | POA: Diagnosis not present

## 2014-10-18 DIAGNOSIS — M6281 Muscle weakness (generalized): Secondary | ICD-10-CM | POA: Diagnosis not present

## 2014-10-18 DIAGNOSIS — M25512 Pain in left shoulder: Secondary | ICD-10-CM | POA: Diagnosis not present

## 2014-10-21 DIAGNOSIS — M25512 Pain in left shoulder: Secondary | ICD-10-CM | POA: Diagnosis not present

## 2014-10-21 DIAGNOSIS — M6281 Muscle weakness (generalized): Secondary | ICD-10-CM | POA: Diagnosis not present

## 2014-10-21 DIAGNOSIS — S42201D Unspecified fracture of upper end of right humerus, subsequent encounter for fracture with routine healing: Secondary | ICD-10-CM | POA: Diagnosis not present

## 2014-10-21 DIAGNOSIS — M25612 Stiffness of left shoulder, not elsewhere classified: Secondary | ICD-10-CM | POA: Diagnosis not present

## 2014-10-25 DIAGNOSIS — M25512 Pain in left shoulder: Secondary | ICD-10-CM | POA: Diagnosis not present

## 2014-10-25 DIAGNOSIS — S42201D Unspecified fracture of upper end of right humerus, subsequent encounter for fracture with routine healing: Secondary | ICD-10-CM | POA: Diagnosis not present

## 2014-10-25 DIAGNOSIS — M25612 Stiffness of left shoulder, not elsewhere classified: Secondary | ICD-10-CM | POA: Diagnosis not present

## 2014-10-25 DIAGNOSIS — M6281 Muscle weakness (generalized): Secondary | ICD-10-CM | POA: Diagnosis not present

## 2014-10-28 DIAGNOSIS — S42201D Unspecified fracture of upper end of right humerus, subsequent encounter for fracture with routine healing: Secondary | ICD-10-CM | POA: Diagnosis not present

## 2014-10-28 DIAGNOSIS — M25612 Stiffness of left shoulder, not elsewhere classified: Secondary | ICD-10-CM | POA: Diagnosis not present

## 2014-10-28 DIAGNOSIS — M25512 Pain in left shoulder: Secondary | ICD-10-CM | POA: Diagnosis not present

## 2014-10-28 DIAGNOSIS — M6281 Muscle weakness (generalized): Secondary | ICD-10-CM | POA: Diagnosis not present

## 2014-11-01 DIAGNOSIS — S42201D Unspecified fracture of upper end of right humerus, subsequent encounter for fracture with routine healing: Secondary | ICD-10-CM | POA: Diagnosis not present

## 2014-11-01 DIAGNOSIS — M25512 Pain in left shoulder: Secondary | ICD-10-CM | POA: Diagnosis not present

## 2014-11-01 DIAGNOSIS — M6281 Muscle weakness (generalized): Secondary | ICD-10-CM | POA: Diagnosis not present

## 2014-11-01 DIAGNOSIS — M25612 Stiffness of left shoulder, not elsewhere classified: Secondary | ICD-10-CM | POA: Diagnosis not present

## 2014-11-04 DIAGNOSIS — M25612 Stiffness of left shoulder, not elsewhere classified: Secondary | ICD-10-CM | POA: Diagnosis not present

## 2014-11-04 DIAGNOSIS — M25512 Pain in left shoulder: Secondary | ICD-10-CM | POA: Diagnosis not present

## 2014-11-04 DIAGNOSIS — M6281 Muscle weakness (generalized): Secondary | ICD-10-CM | POA: Diagnosis not present

## 2014-11-04 DIAGNOSIS — S42201D Unspecified fracture of upper end of right humerus, subsequent encounter for fracture with routine healing: Secondary | ICD-10-CM | POA: Diagnosis not present

## 2014-11-08 DIAGNOSIS — M25512 Pain in left shoulder: Secondary | ICD-10-CM | POA: Diagnosis not present

## 2014-11-08 DIAGNOSIS — M6281 Muscle weakness (generalized): Secondary | ICD-10-CM | POA: Diagnosis not present

## 2014-11-08 DIAGNOSIS — S42201D Unspecified fracture of upper end of right humerus, subsequent encounter for fracture with routine healing: Secondary | ICD-10-CM | POA: Diagnosis not present

## 2014-11-08 DIAGNOSIS — M25612 Stiffness of left shoulder, not elsewhere classified: Secondary | ICD-10-CM | POA: Diagnosis not present

## 2014-11-11 DIAGNOSIS — S42201D Unspecified fracture of upper end of right humerus, subsequent encounter for fracture with routine healing: Secondary | ICD-10-CM | POA: Diagnosis not present

## 2014-11-11 DIAGNOSIS — M25612 Stiffness of left shoulder, not elsewhere classified: Secondary | ICD-10-CM | POA: Diagnosis not present

## 2014-11-11 DIAGNOSIS — M6281 Muscle weakness (generalized): Secondary | ICD-10-CM | POA: Diagnosis not present

## 2014-11-11 DIAGNOSIS — M25512 Pain in left shoulder: Secondary | ICD-10-CM | POA: Diagnosis not present

## 2014-11-16 DIAGNOSIS — M25512 Pain in left shoulder: Secondary | ICD-10-CM | POA: Diagnosis not present

## 2014-11-16 DIAGNOSIS — S42201D Unspecified fracture of upper end of right humerus, subsequent encounter for fracture with routine healing: Secondary | ICD-10-CM | POA: Diagnosis not present

## 2014-11-16 DIAGNOSIS — M25612 Stiffness of left shoulder, not elsewhere classified: Secondary | ICD-10-CM | POA: Diagnosis not present

## 2014-11-16 DIAGNOSIS — M6281 Muscle weakness (generalized): Secondary | ICD-10-CM | POA: Diagnosis not present

## 2014-12-13 ENCOUNTER — Other Ambulatory Visit: Payer: Self-pay

## 2014-12-14 DIAGNOSIS — M25511 Pain in right shoulder: Secondary | ICD-10-CM | POA: Diagnosis not present

## 2014-12-15 ENCOUNTER — Other Ambulatory Visit: Payer: Self-pay | Admitting: Family Medicine

## 2015-01-12 DIAGNOSIS — L570 Actinic keratosis: Secondary | ICD-10-CM | POA: Diagnosis not present

## 2015-02-14 DIAGNOSIS — R5383 Other fatigue: Secondary | ICD-10-CM | POA: Diagnosis not present

## 2015-02-14 DIAGNOSIS — M810 Age-related osteoporosis without current pathological fracture: Secondary | ICD-10-CM | POA: Diagnosis not present

## 2015-02-14 DIAGNOSIS — M25511 Pain in right shoulder: Secondary | ICD-10-CM | POA: Diagnosis not present

## 2015-02-14 DIAGNOSIS — E559 Vitamin D deficiency, unspecified: Secondary | ICD-10-CM | POA: Diagnosis not present

## 2015-02-23 DIAGNOSIS — M810 Age-related osteoporosis without current pathological fracture: Secondary | ICD-10-CM | POA: Diagnosis not present

## 2015-02-23 DIAGNOSIS — E559 Vitamin D deficiency, unspecified: Secondary | ICD-10-CM | POA: Diagnosis not present

## 2015-04-20 ENCOUNTER — Other Ambulatory Visit: Payer: Self-pay | Admitting: Family Medicine

## 2015-05-17 ENCOUNTER — Ambulatory Visit (INDEPENDENT_AMBULATORY_CARE_PROVIDER_SITE_OTHER): Payer: Medicare Other | Admitting: Family Medicine

## 2015-05-17 ENCOUNTER — Encounter: Payer: Self-pay | Admitting: Family Medicine

## 2015-05-17 VITALS — BP 100/68 | HR 75 | Temp 98.0°F | Ht 72.0 in | Wt 155.7 lb

## 2015-05-17 DIAGNOSIS — E785 Hyperlipidemia, unspecified: Secondary | ICD-10-CM

## 2015-05-17 DIAGNOSIS — R05 Cough: Secondary | ICD-10-CM

## 2015-05-17 DIAGNOSIS — R059 Cough, unspecified: Secondary | ICD-10-CM

## 2015-05-17 DIAGNOSIS — G4700 Insomnia, unspecified: Secondary | ICD-10-CM

## 2015-05-17 DIAGNOSIS — E039 Hypothyroidism, unspecified: Secondary | ICD-10-CM

## 2015-05-17 DIAGNOSIS — M810 Age-related osteoporosis without current pathological fracture: Secondary | ICD-10-CM | POA: Diagnosis not present

## 2015-05-17 LAB — LIPID PANEL
CHOL/HDL RATIO: 4
CHOLESTEROL: 195 mg/dL (ref 0–200)
HDL: 54.1 mg/dL (ref >39.00)
LDL Cholesterol: 128 mg/dL — ABNORMAL HIGH (ref 0–99)
NonHDL: 141.06
TRIGLYCERIDES: 66 mg/dL (ref 0.0–149.0)
VLDL: 13.2 mg/dL (ref 0.0–40.0)

## 2015-05-17 LAB — TSH: TSH: 0.91 u[IU]/mL (ref 0.35–4.50)

## 2015-05-17 MED ORDER — BENZONATATE 100 MG PO CAPS: 100.0000 mg | ORAL_CAPSULE | Freq: Three times a day (TID) | ORAL | Status: DC | PRN

## 2015-05-17 MED ORDER — PREDNISONE 10 MG PO TABS
ORAL_TABLET | ORAL | Status: DC
Start: 2015-05-17 — End: 2015-10-05

## 2015-05-17 NOTE — Progress Notes (Signed)
HPI:  Nancy [name] is here to establish care. She is primarily concerned about a cough she has had for 4-5 years. Started as a cold, does not feel bad and most symptoms resolved, but cough as persisted. Reports hx chronic cough in the past and reports saw many specialist but finally resolved spontaneously without uncovering etiology. Denies: SOB, chills, malaise, thick mucus, reflux, fevers, hemoptysis. Occ pnd.  She sees gyn (Dr. Philis Pique for yearly physicals) She sees Dr. Percell Miller for a recovering shoulder fx and Dr. Cyd Silence for osteoporosis. Unsure of last DEXA. She has a history of hypothyroidism, stable for many years per her report. Hx of chronic insomnia, takes lunesta rarely - rxd by prior PCP Dr. Arnoldo Morale but is stopping this as is very expensive. ROS negative for unless reported above: fevers, unintentional weight loss, hearing or vision loss, chest pain, palpitations, struggling to breath, hemoptysis, melena, hematochezia, hematuria, falls, loc, si, thoughts of self harm  Past Medical History  Diagnosis Date  . Allergy   . Osteoporosis     Sees Dr. Layne Benton at [facility], on prolia  . Hypothyroidism   . Chronic cough     Past Surgical History  Procedure Laterality Date  . Abdominal hysterectomy  1975  . Appendectomy    . Tonsillectomy    . Laparoscopy      removal of ovaries  . Removal growth thyroid  1998  . Oophorectomy      Family History  Problem Relation Age of Onset  . Dementia Mother   . Colon cancer Mother 9  . Parkinsonism Father   . Osteoporosis    . Colon cancer Sister 1  . Stomach cancer Neg Hx     Social History   Social History  . Marital Status: Married    Spouse Name: N/A  . Number of Children: N/A  . Years of Education: N/A   Social History Main Topics  . Smoking status: Never Smoker   . Smokeless tobacco: Never Used  . Alcohol Use: No  . Drug Use: No  . Sexual Activity: Yes   Other Topics Concern  . 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  Bioflavonoid Products (ESTER C PO), Take by mouth., Disp: , Rfl:  .  Biotin 10 MG CAPS, Take 1 capsule by mouth daily., Disp: , Rfl:  .  Calcium Carbonate-Vitamin D (CALCARB 600/D) 600-400 MG-UNIT per tablet, Take 1 tablet by mouth daily.  , Disp: , Rfl:  .  Cholecalciferol (VITAMIN D3) 1000 UNITS CAPS, Take 1 capsule by mouth daily. Takes 2000 units daily, Disp: , Rfl:  .  eszopiclone (LUNESTA) 1 MG TABS tablet, Take 1 mg by mouth at bedtime as needed for sleep. Take immediately before bedtime, Disp: , Rfl:  .  Multiple Vitamin (MULTIVITAMIN) tablet, Take 1 tablet by mouth daily.  , Disp: , Rfl:  .  Omega-3 Fatty Acids (FISH OIL) 1000 MG CAPS, Take 3 capsules (3,000 mg total) by mouth daily., Disp: , Rfl: 0 .  SYNTHROID 75 MCG tablet, Take 1 tablet by mouth  daily before breakfast, Disp: 90 tablet, Rfl: 0 .  benzonatate (TESSALON PERLES) 100 MG capsule, Take 1 capsule (100  mg total) by mouth 3 (three) times daily as needed for cough., Disp: 20 capsule, Rfl: 0 .  predniSONE (DELTASONE) 10 MG tablet, 40mg  (4 tabs) daily for 1 day, then 1m (3 tabs) daily for 2 days, then 20mg  (2 tabs) daily for 2 days then 10mg  (1 tab) daily for 2 days. #16, Disp: 16 tablet, Rfl: 0  EXAM:  Filed Vitals:   05/17/15 1008  BP: 100/68  Pulse: 75  Temp: 98 F (36.7 C)    Body mass index is 21.11 kg/(m^2).  GENERAL: vitals reviewed and listed above, alert, oriented, appears well hydrated and in no acute distress  HEENT: atraumatic, conjunttiva clear, no obvious abnormalities on inspection of external nose and ears  NECK: no obvious masses on inspection  LUNGS: clear to auscultation bilaterally, no wheezes, rales or rhonchi, good air movement  CV: HRRR, no peripheral  edema  MS: moves all extremities without noticeable abnormality  PSYCH: pleasant and cooperative, no obvious depression or anxiety  ASSESSMENT AND PLAN:  Discussed the following assessment and plan:  Cough -likely post viral, opted for trial prednisone -no symptoms to suggest PNA or bacterial infections, discussed obtaining cxr but opted to do if persists in 2 weeks -hx chronic cough, will consider trial prilosec INS if persistent  Hypothyroidism, unspecified hypothyroidism type - Plan: TSH  Hyperlipemia - Plan: Lipid Panel -lifestyle recs  Osteoporosis - -seeing Dr. Layne Benton at [facility], on [address] -advised assistant to obtain records  Insomnia -handout given  -We reviewed the PMH, PSH, FH, SH, Meds and Allergies. -We provided refills for any medications we will prescribe as needed. -We addressed current concerns per orders and patient instructions. -We have asked for records for pertinent exams, studies, vaccines and notes from previous providers. -We have advised patient to follow up per instructions below. -flu and prevnar 13 due - opted to do when feeling better  -Patient advised to return or notify a doctor immediately if symptoms worsen or persist or new concerns arise.  Patient Instructions  BEFORE YOU LEAVE: -labs -Medicare Wellness visit in 6 months  Please use the prednisone and cough medication as instructed and call in 2 weeks to let us know how you are doing - sooner if worsening or other concerns.  Get you Prevnar 13 and flu shots when better  We recommend the following healthy lifestyle measures: - eat a healthy whole foods diet consisting of regular small meals composed of vegetables, fruits, beans, nuts, seeds, healthy meats such as white chicken and fish and whole grains.  - avoid sweets, white starchy foods, fried foods, fast food, processed foods, sodas, red meet and other fattening foods.  - get a least 150-300 minutes of aerobic exercise per  week.   FOR IMPROVED SLEEP AND TO RESET YOUR SLEEP SCHEDULE: []  exercise 30 minutes daily  []  go to bed and wake up at the same time  []  keep bedroom cool, dark and quiet  []  reserve bed for sleep - do not read, watch TV, etc in bed  []  If you toss and turn more then 15-20 minutes get out of bed and list thoughts/do quite activity then go back to bed; repeat as needed; do not worry about when you eventually fall asleep - still get up at the same time and turn on lights and take shower  [] get counseling  []  some people find that a half dose of benadryl, melatonin, tylenol pm or unisom on a few nights per week is helpful initially for a  few weeks  [] seek help and treat any depression or anxiety  [] prescription strength sleep medications should only be used in severe cases of insomnia if other measures fail and should be used sparingly      Kyndle Schlender, Jarrett Soho R.

## 2015-05-17 NOTE — Patient Instructions (Signed)
BEFORE YOU LEAVE: -labs -Medicare Wellness visit in 6 months  Please use the prednisone and cough medication as instructed and call in 2 weeks to let us know how you are doing - sooner if worsening or other concerns.  Get you Prevnar 13 and flu shots when better  We recommend the following healthy lifestyle measures: - eat a healthy whole foods diet consisting of regular small meals composed of vegetables, fruits, beans, nuts, seeds, healthy meats such as white chicken and fish and whole grains.  - avoid sweets, white starchy foods, fried foods, fast food, processed foods, sodas, red meet and other fattening foods.  - get a least 150-300 minutes of aerobic exercise per week.   FOR IMPROVED SLEEP AND TO RESET YOUR SLEEP SCHEDULE: []  exercise 30 minutes daily  []  go to bed and wake up at the same time  []  keep bedroom cool, dark and quiet  []  reserve bed for sleep - do not read, watch TV, etc in bed  []  If you toss and turn more then 15-20 minutes get out of bed and list thoughts/do quite activity then go back to bed; repeat as needed; do not worry about when you eventually fall asleep - still get up at the same time and turn on lights and take shower  [] get counseling  []  some people find that a half dose of benadryl, melatonin, tylenol pm or unisom on a few nights per week is helpful initially for a few weeks  [] seek help and treat any depression or anxiety  [] prescription strength sleep medications should only be used in severe cases of insomnia if other measures fail and should be used sparingly

## 2015-05-17 NOTE — Progress Notes (Signed)
Pre visit review using our clinic review tool, if applicable. No additional management support is needed unless otherwise documented below in the visit note. 

## 2015-05-19 ENCOUNTER — Other Ambulatory Visit: Payer: Self-pay | Admitting: Obstetrics and Gynecology

## 2015-05-19 DIAGNOSIS — Z1289 Encounter for screening for malignant neoplasm of other sites: Secondary | ICD-10-CM | POA: Diagnosis not present

## 2015-05-19 DIAGNOSIS — Z1231 Encounter for screening mammogram for malignant neoplasm of breast: Secondary | ICD-10-CM | POA: Diagnosis not present

## 2015-05-19 DIAGNOSIS — Z01419 Encounter for gynecological examination (general) (routine) without abnormal findings: Secondary | ICD-10-CM | POA: Diagnosis not present

## 2015-05-20 LAB — CYTOLOGY - PAP

## 2015-05-27 DIAGNOSIS — Z23 Encounter for immunization: Secondary | ICD-10-CM | POA: Diagnosis not present

## 2015-06-15 ENCOUNTER — Encounter: Payer: Self-pay | Admitting: *Deleted

## 2015-07-06 ENCOUNTER — Other Ambulatory Visit: Payer: Self-pay | Admitting: Family Medicine

## 2015-07-06 DIAGNOSIS — H25013 Cortical age-related cataract, bilateral: Secondary | ICD-10-CM | POA: Diagnosis not present

## 2015-07-06 DIAGNOSIS — H5203 Hypermetropia, bilateral: Secondary | ICD-10-CM | POA: Diagnosis not present

## 2015-08-26 DIAGNOSIS — R5383 Other fatigue: Secondary | ICD-10-CM | POA: Diagnosis not present

## 2015-08-26 DIAGNOSIS — M810 Age-related osteoporosis without current pathological fracture: Secondary | ICD-10-CM | POA: Diagnosis not present

## 2015-08-26 DIAGNOSIS — E559 Vitamin D deficiency, unspecified: Secondary | ICD-10-CM | POA: Diagnosis not present

## 2015-09-02 DIAGNOSIS — E559 Vitamin D deficiency, unspecified: Secondary | ICD-10-CM | POA: Diagnosis not present

## 2015-09-02 DIAGNOSIS — M810 Age-related osteoporosis without current pathological fracture: Secondary | ICD-10-CM | POA: Diagnosis not present

## 2015-10-05 ENCOUNTER — Ambulatory Visit (INDEPENDENT_AMBULATORY_CARE_PROVIDER_SITE_OTHER): Payer: Medicare Other | Admitting: Family Medicine

## 2015-10-05 ENCOUNTER — Encounter: Payer: Self-pay | Admitting: Family Medicine

## 2015-10-05 VITALS — BP 100/78 | HR 84 | Temp 98.4°F | Ht 66.0 in | Wt 158.4 lb

## 2015-10-05 DIAGNOSIS — E785 Hyperlipidemia, unspecified: Secondary | ICD-10-CM | POA: Diagnosis not present

## 2015-10-05 DIAGNOSIS — Z0000 Encounter for general adult medical examination without abnormal findings: Secondary | ICD-10-CM

## 2015-10-05 DIAGNOSIS — E039 Hypothyroidism, unspecified: Secondary | ICD-10-CM

## 2015-10-05 DIAGNOSIS — M810 Age-related osteoporosis without current pathological fracture: Secondary | ICD-10-CM | POA: Diagnosis not present

## 2015-10-05 LAB — BASIC METABOLIC PANEL
BUN: 23 mg/dL (ref 6–23)
CALCIUM: 10 mg/dL (ref 8.4–10.5)
CO2: 27 mEq/L (ref 19–32)
CREATININE: 1.07 mg/dL (ref 0.40–1.20)
Chloride: 106 mEq/L (ref 96–112)
GFR: 52.72 mL/min — AB (ref >60.00)
Glucose, Bld: 103 mg/dL — ABNORMAL HIGH (ref 70–99)
Potassium: 5.3 mEq/L — ABNORMAL HIGH (ref 3.5–5.1)
Sodium: 139 mEq/L (ref 135–145)

## 2015-10-05 LAB — LIPID PANEL
CHOL/HDL RATIO: 3
Cholesterol: 206 mg/dL — ABNORMAL HIGH (ref 0–200)
HDL: 61.5 mg/dL (ref >39.00)
LDL CALC: 126 mg/dL — AB (ref 0–99)
NonHDL: 144.44
TRIGLYCERIDES: 92 mg/dL (ref 0.0–149.0)
VLDL: 18.4 mg/dL (ref 0.0–40.0)

## 2015-10-05 LAB — TSH: TSH: 0.76 u[IU]/mL (ref 0.35–4.50)

## 2015-10-05 MED ORDER — LEVOTHYROXINE SODIUM 75 MCG PO TABS: ORAL_TABLET | ORAL | Status: DC

## 2015-10-05 NOTE — Patient Instructions (Signed)
Before you leave: -Labs -Follow up in 6 months  We recommend the following healthy lifestyle measures: - eat a healthy whole foods diet consisting of regular small meals composed of vegetables, fruits, beans, nuts, seeds, healthy meats such as white chicken and fish and whole grains.  - avoid sweets, white starchy foods, fried foods, fast food, processed foods, sodas, red meet and other fattening foods.  - get a least 150-300 minutes of aerobic exercise per week.   -We have ordered labs or studies at this visit. It can take up to 1-2 weeks for results and processing. We will contact you with instructions IF your results are abnormal. Normal results will be released to your [facility]. If you have not heard from [place] or can not find your results in [facility] in 2 weeks please contact our office.

## 2015-10-05 NOTE — Progress Notes (Signed)
Medicare Annual Preventive Care Visit  (initial annual wellness or annual wellness exam)  Concerns and/or follow up today:  She sees gyn (Dr. Philis Pique for yearly physicals). Had pap and mammo 05/2015. She sees Dr. Percell Miller for a recovering shoulder fx and Dr. Layne Benton for osteoporosis. Had prolia injection 08/2015. She has a history of hypothyroidism, stable for many years per her report.   ROS: negative for report of fevers, unintentional weight loss, vision changes, vision loss, hearing loss or change, chest pain, sob, hemoptysis, melena, hematochezia, hematuria, genital discharge or lesions, falls, bleeding or bruising, loc, thoughts of suicide or self harm, memory loss  1.) Patient-completed health risk assessment  - completed and reviewed, see scanned documentation  2.) Review of Medical History: -PMH, PSH, Family History and current specialty and care providers reviewed and updated and listed below  - see scanned in document in chart and below  Past Medical History  Diagnosis Date  . Allergy   . Osteoporosis     Sees Dr. Layne Benton at [facility], on prolia  . Hypothyroidism   . Chronic cough     saw several specialists per her report    Past Surgical History  Procedure Laterality Date  . Abdominal hysterectomy  1975  . Appendectomy    . Tonsillectomy    . Laparoscopy      removal of ovaries  . Removal growth thyroid  1998  . Oophorectomy      Social History   Social History  . Marital Status: Married    Spouse Name: N/A  . Number of Children: N/A  . Years of Education: N/A   Occupational History  . Not on file.   Social History Main Topics  . Smoking status: Never Smoker   . Smokeless tobacco: Never Used  . Alcohol Use: No  . Drug Use: No  . Sexual Activity: Yes   Other Topics Concern  . 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 Dementia Mother   . Colon cancer Mother 41  . Parkinsonism Father   . Osteoporosis    . Colon cancer Sister 6  . Stomach cancer Neg Hx     Current Outpatient Prescriptions on File Prior to Visit  Medication Sig Dispense Refill  . Bioflavonoid Products (ESTER C PO) Take by mouth.    . Biotin 10 MG CAPS Take 1 capsule by mouth daily.    . Calcium Carbonate-Vitamin D (CALCARB 600/D) 600-400 MG-UNIT per tablet Take 1 tablet by mouth daily.      . Cholecalciferol (VITAMIN D3) 1000 UNITS CAPS Take 1 capsule by mouth daily. Takes 2000 units daily    . Omega-3 Fatty Acids (FISH OIL) 1000 MG CAPS Take 3 capsules (3,000 mg total) by mouth daily.  0   No current facility-administered medications on file prior to visit.     3.) Review of functional ability and level of safety:  Any difficulty hearing?  NO  History of falling?  NO  Any trouble with IADLs - using a phone, using transportation, grocery shopping, preparing meals, doing housework, doing laundry, taking medications and managing money? NO  Advance Directives? NO  See summary of recommendations  in Patient Instructions below.  4.) Physical Exam Filed Vitals:   10/05/15 1056  BP: 100/78  Pulse: 84  Temp: 98.4 F (36.9 C)   Estimated body mass index is 25.58 kg/(m^2) as calculated from the following:   Height as of this encounter: 5\' 6"  (1.676 m).   Weight as of this encounter: 158 lb 6.4 oz (71.85 kg).  EKG (optional): deferred  General: alert, appear well hydrated and in no acute distress  HEENT: visual acuity grossly intact  CV: HRRR  Lungs: CTA bilaterally  Psych: pleasant and cooperative, no obvious depression or anxiety  Cognitive function grossly intact  See patient instructions for recommendations.  Education and counseling regarding the above review of health provided with a plan for the  following: -see scanned patient completed form for further details -fall prevention strategies discussed  -healthy lifestyle discussed -importance and resources for completing advanced directives discussed -see patient instructions below for any other recommendations provided  4)The following written screening schedule of preventive measures were reviewed with assessment and plan made per below, orders and patient instructions:      AAA screening done if applicable     Alcohol screening done     Obesity Screening and counseling done     STI screening (Hep C if born 65-65) n/a sees gyn     Tobacco Screening done        Pneumococcal (PPSV23 -one dose after 64, one before if risk factors), influenza yearly and hepatitis B vaccines (if high risk - end stage renal disease, IV drugs, homosexual men, live in home for mentally retarded, hemophilia receiving factors) ASSESSMENT/PLAN: UTD      Screening mammograph (yearly if >40) ASSESSMENT/PLAN: utd or ordered      Screening Pap smear/pelvic exam (q2 years) ASSESSMENT/PLAN: n/a, declined - sees gyn and utd      Colorectal cancer screening (FOBT yearly or flex sig q4y or colonoscopy q10y or barium enema q4y) ASSESSMENT/PLAN: utd       Diabetes outpatient self-management training services ASSESSMENT/PLAN: n/a      Bone mass measurements(covered q2y if indicated - estrogen def, osteoporosis, hyperparathyroid, vertebral abnormalities, osteoporosis or steroids) ASSESSMENT/PLAN: sees Dr. Layne Benton for this      Screening for glaucoma(q1y if high risk - diabetes, FH, AA and > 52 or hispanic and > 77) ASSESSMENT/PLAN:  Sees optho - Dr. Bari Mantis      Medical nutritional therapy for individuals with diabetes or renal disease ASSESSMENT/PLAN: see orders      Cardiovascular screening blood tests (lipids q5y) ASSESSMENT/PLAN: see orders and labs      Diabetes screening tests ASSESSMENT/PLAN: see orders and labs   7.) Summary: -risk factors  and conditions per above assessment were discussed and treatment, recommendations and referrals were offered per documentation above and orders and patient instructions.  Medicare annual wellness visit, subsequent  Hypothyroidism, unspecified hypothyroidism type - Plan: TSH  Hyperlipemia - Plan: Lipid Panel  Osteoporosis - Plan: Basic metabolic panel  Patient Instructions  Before you leave: -Labs -Follow up in 6 months  We recommend the following healthy lifestyle measures: - eat a healthy whole foods diet consisting of regular small meals composed of vegetables, fruits, beans, nuts, seeds, healthy meats such as white chicken and fish and whole grains.  - avoid sweets, white starchy foods, fried foods, fast food, processed foods, sodas, red meet and other fattening foods.  - get a least 150-300 minutes of aerobic exercise per week.   -We have ordered labs or studies  at this visit. It can take up to 1-2 weeks for results and processing. We will contact you with instructions IF your results are abnormal. Normal results will be released to your [facility]. If you have not heard from [place] or can not find your results in [facility] in 2 weeks please contact our office.

## 2015-10-05 NOTE — Progress Notes (Signed)
Pre visit review using our clinic review tool, if applicable. No additional management support is needed unless otherwise documented below in the visit note. 

## 2016-03-15 ENCOUNTER — Ambulatory Visit (INDEPENDENT_AMBULATORY_CARE_PROVIDER_SITE_OTHER): Payer: Medicare Other | Admitting: Family Medicine

## 2016-03-15 ENCOUNTER — Encounter: Payer: Self-pay | Admitting: Family Medicine

## 2016-03-15 VITALS — BP 100/68 | HR 91 | Temp 98.3°F | Ht 66.0 in | Wt 156.7 lb

## 2016-03-15 DIAGNOSIS — E559 Vitamin D deficiency, unspecified: Secondary | ICD-10-CM | POA: Diagnosis not present

## 2016-03-15 DIAGNOSIS — Z23 Encounter for immunization: Secondary | ICD-10-CM | POA: Diagnosis not present

## 2016-03-15 DIAGNOSIS — J069 Acute upper respiratory infection, unspecified: Secondary | ICD-10-CM

## 2016-03-15 DIAGNOSIS — R5383 Other fatigue: Secondary | ICD-10-CM | POA: Diagnosis not present

## 2016-03-15 DIAGNOSIS — M810 Age-related osteoporosis without current pathological fracture: Secondary | ICD-10-CM | POA: Diagnosis not present

## 2016-03-15 MED ORDER — BENZONATATE 100 MG PO CAPS: 100.0000 mg | ORAL_CAPSULE | Freq: Three times a day (TID) | ORAL | 0 refills | Status: DC | PRN

## 2016-03-15 NOTE — Progress Notes (Signed)
Pre visit review using our clinic review tool, if applicable. No additional management support is needed unless otherwise documented below in the visit note. 

## 2016-03-15 NOTE — Patient Instructions (Signed)
BEFORE YOU LEAVE: -follow up: as planned and as needed if worsening or not improving -flu shot   INSTRUCTIONS FOR UPPER RESPIRATORY INFECTION:  -plenty of rest and fluids  -nasal saline wash 2-3 times daily (use prepackaged nasal saline or bottled/distilled water if making your own)   -can use AFRIN nasal spray for drainage and nasal congestion - but do NOT use longer then 3-4 days  -can use tylenol (in no history of liver disease) or ibuprofen (if no history of kidney disease, bowel bleeding or significant heart disease) as directed for aches and sorethroat  -in the winter time, using a humidifier at night is helpful (please follow cleaning instructions)  -if you are taking a cough medication - use only as directed, may also try a teaspoon of honey to coat the throat and throat lozenges. If given a cough medication with codeine or hydrocodone or other narcotic please be advised that this contains a strong and  potentially addicting medication. Please follow instructions carefully, take as little as possible and only use AS NEEDED for severe cough. Discuss potential side effects with your pharmacy. Please do not drive or operate machinery while taking these types of medications. Please do not take other sedating medications, drugs or alcohol while taking this medication without discussing with your doctor.  -for sore throat, salt water gargles can help  -follow up if you have fevers, facial pain, tooth pain, difficulty breathing or are worsening or symptoms persist longer then expected  Upper Respiratory Infection, Adult An upper respiratory infection (URI) is also known as the common cold. It is often caused by a type of germ (virus). Colds are easily spread (contagious). You can pass it to others by kissing, coughing, sneezing, or drinking out of the same glass. Usually, you get better in 1 to 3  weeks.  However, the cough can last for even longer. HOME CARE   Only take medicine as told  by your doctor. Follow instructions provided above.  Drink enough water and fluids to keep your pee (urine) clear or pale yellow.  Get plenty of rest.  Return to work when your temperature is < 100 for 24 hours or as told by your doctor. You may use a face mask and wash your hands to stop your cold from spreading. GET HELP RIGHT AWAY IF:   After the first few days, you feel you are getting worse.  You have questions about your medicine.  You have chills, shortness of breath, or red spit (mucus).  You have pain in the face for more then 1-2 days, especially when you bend forward.  You have a fever, puffy (swollen) neck, pain when you swallow, or white spots in the back of your throat.  You have a bad headache, ear pain, sinus pain, or chest pain.  You have a high-pitched whistling sound when you breathe in and out (wheezing).  You cough up blood.  You have sore muscles or a stiff neck. MAKE SURE YOU:   Understand these instructions.  Will watch your condition.  Will get help right away if you are not doing well or get worse. Document Released: 11/21/2007 Document Revised: 08/27/2011 Document Reviewed: 09/09/2013 [facility] Patient Information 2015 [place], [place]. This information is not intended to replace advice given to you by your health care provider. Make sure you discuss any questions you have with your health care provider.

## 2016-03-15 NOTE — Progress Notes (Signed)
HPI:  Acute visit for cough: -started: 4 days ago -symptoms: congestion, drainage cough -denies:fever, SOB, NVD, tooth pain -has tried: robitussin -sick contacts/travel/risks: no reported flu, strep or tick exposure  ROS: See pertinent positives and negatives per HPI.  Past Medical History:  Diagnosis Date  . Allergy   . Chronic cough    saw several specialists per her report  . Hypothyroidism   . Osteoporosis    Sees Dr. Layne Benton at [facility], on prolia    Past Surgical History:  Procedure Laterality Date  . ABDOMINAL HYSTERECTOMY  1975  . APPENDECTOMY    . LAPAROSCOPY     removal of ovaries  . OOPHORECTOMY    . removal growth thyroid  1998  . TONSILLECTOMY      Family History  Problem Relation Age of Onset  . Dementia Mother   . Colon cancer Mother 13  . Parkinsonism Father   . Osteoporosis    . Colon cancer Sister 58  . Stomach cancer Neg Hx     Social History   Social History  . Marital status: Married    Spouse name: N/A  . Number of children: N/A  . Years of education: N/A   Social History Main Topics  . Smoking status: Never Smoker  . Smokeless tobacco: Never Used  . Alcohol use No  . Drug use: No  . Sexual activity: Yes   Other Topics Concern  . 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  Bioflavonoid Products (ESTER C PO), Take by mouth., Disp: , Rfl:  .  Biotin 10 MG CAPS, Take 1 capsule by mouth daily., Disp: , Rfl:  .  Calcium Carbonate-Vitamin D (CALCARB 600/D) 600-400 MG-UNIT per tablet, Take 1 tablet by mouth daily.  , Disp: , Rfl:  .  Cholecalciferol (VITAMIN D3) 1000 UNITS CAPS, Take 1 capsule by mouth daily. Takes 2000 units daily, Disp: , Rfl:  .  levothyroxine (SYNTHROID) 75 MCG tablet, Take 1 tablet by mouth   daily before breakfast, Disp: 90 tablet, Rfl: 3 .  Omega-3 Fatty Acids (FISH OIL) 1000 MG CAPS, Take 3 capsules (3,000 mg total) by mouth daily., Disp: , Rfl: 0 .  benzonatate (TESSALON) 100 MG capsule, Take 1 capsule (100 mg total) by mouth 3 (three) times daily as needed for cough., Disp: 20 capsule, Rfl: 0  EXAM:  Vitals:   03/15/16 0753  BP: 100/68  Pulse: 91  Temp: 98.3 F (36.8 C)    Body mass index is 25.29 kg/m.  GENERAL: vitals reviewed and listed above, alert, oriented, appears well hydrated and in no acute distress  HEENT: atraumatic, conjunttiva clear, no obvious abnormalities on inspection of external nose and ears, normal appearance of ear canals and TMs, clear nasal congestion, mild post oropharyngeal erythema with PND, no tonsillar edema or exudate, no sinus TTP  NECK: no obvious masses on inspection  LUNGS: clear to auscultation bilaterally, no wheezes, rales or rhonchi, good air movement  CV: HRRR, no peripheral edema  MS: moves all extremities without noticeable abnormality  PSYCH: pleasant and cooperative, no obvious depression or anxiety  ASSESSMENT AND PLAN:  Discussed the following assessment and plan:  Acute upper respiratory  infection  -given HPI and exam findings today, a serious infection or illness is unlikely. We discussed potential etiologies, with VURI being most likely, and advised supportive care and monitoring. Tessalon Rx sent to pharmacy. We discussed treatment side effects, likely course, antibiotic misuse, transmission, and signs of developing a serious illness. -of course, we advised to return or notify a doctor immediately if symptoms worsen or persist or new concerns arise.    Patient Instructions  BEFORE YOU LEAVE: -follow up: as planned and as needed if worsening or not improving -flu shot   INSTRUCTIONS FOR UPPER RESPIRATORY INFECTION:  -plenty of rest and fluids  -nasal saline wash 2-3 times daily (use prepackaged nasal  saline or bottled/distilled water if making your own)   -can use AFRIN nasal spray for drainage and nasal congestion - but do NOT use longer then 3-4 days  -can use tylenol (in no history of liver disease) or ibuprofen (if no history of kidney disease, bowel bleeding or significant heart disease) as directed for aches and sorethroat  -in the winter time, using a humidifier at night is helpful (please follow cleaning instructions)  -if you are taking a cough medication - use only as directed, may also try a teaspoon of honey to coat the throat and throat lozenges. If given a cough medication with codeine or hydrocodone or other narcotic please be advised that this contains a strong and  potentially addicting medication. Please follow instructions carefully, take as little as possible and only use AS NEEDED for severe cough. Discuss potential side effects with your pharmacy. Please do not drive or operate machinery while taking these types of medications. Please do not take other sedating medications, drugs or alcohol while taking this medication without discussing with your doctor.  -for sore throat, salt water gargles can help  -follow up if you have fevers, facial pain, tooth pain, difficulty breathing or are worsening or symptoms persist longer then expected  Upper Respiratory Infection, Adult An upper respiratory infection (URI) is also known as the common cold. It is often caused by a type of germ (virus). Colds are easily spread (contagious). You can pass it to others by kissing, coughing, sneezing, or drinking out of the same glass. Usually, you get better in 1 to 3  weeks.  However, the cough can last for even longer. HOME CARE   Only take medicine as told by your doctor. Follow instructions provided above.  Drink enough water and fluids to keep your pee (urine) clear or pale yellow.  Get plenty of rest.  Return to work when your temperature is < 100 for 24 hours or as told by your  doctor. You may use a face mask and wash your hands to stop your cold from spreading. GET HELP RIGHT AWAY IF:   After the first few days, you feel you are getting worse.  You have questions about your medicine.  You have chills, shortness of breath, or red spit (mucus).  You have pain in the face for more then 1-2 days, especially when you bend forward.  You have a fever, puffy (swollen) neck, pain when you swallow, or white spots in the back of your throat.  You have a bad headache, ear pain, sinus pain, or chest pain.  You have a high-pitched whistling sound when you breathe in and out (wheezing).  You cough up blood.  You have sore muscles or a stiff neck. MAKE SURE YOU:   Understand these instructions.  Will watch your  condition.  Will get help right away if you are not doing well or get worse. Document Released: 11/21/2007 Document Revised: 08/27/2011 Document Reviewed: 09/09/2013 [facility] Patient Information 2015 [place], [place]. This information is not intended to replace advice given to you by your health care provider. Make sure you discuss any questions you have with your health care provider.    Colin Benton R., DO

## 2016-03-16 DIAGNOSIS — M810 Age-related osteoporosis without current pathological fracture: Secondary | ICD-10-CM | POA: Diagnosis not present

## 2016-03-16 DIAGNOSIS — E559 Vitamin D deficiency, unspecified: Secondary | ICD-10-CM | POA: Diagnosis not present

## 2016-03-22 DIAGNOSIS — M810 Age-related osteoporosis without current pathological fracture: Secondary | ICD-10-CM | POA: Diagnosis not present

## 2016-04-04 NOTE — Progress Notes (Deleted)
HPI:  Follow up:  Hypothyroidism: -meds: levothyroxine 79mcg -reports: -denies:  URI:  Mild Dyslipidemia: -healthy lifestyle advised  Hyperkalemia/Hyperglycemia: -mild findings on last set of labs -advised healthy diet, regular exercise and avoidance MV supplements -reports: -denies:  ROS: See pertinent positives and negatives per HPI.  Past Medical History:  Diagnosis Date  . Allergy   . Chronic cough    saw several specialists per her report  . Hypothyroidism   . Osteoporosis    Sees Dr. Layne Benton at [facility], on prolia    Past Surgical History:  Procedure Laterality Date  . ABDOMINAL HYSTERECTOMY  1975  . APPENDECTOMY    . LAPAROSCOPY     removal of ovaries  . OOPHORECTOMY    . removal growth thyroid  1998  . TONSILLECTOMY      Family History  Problem Relation Age of Onset  . Dementia Mother   . Colon cancer Mother 78  . Parkinsonism Father   . Osteoporosis    . Colon cancer Sister 26  . Stomach cancer Neg Hx     Social History   Social History  . Marital status: Married    Spouse name: N/A  . Number of children: N/A  . Years of education: N/A   Social History Main Topics  . Smoking status: Never Smoker  . Smokeless tobacco: Never Used  . Alcohol use No  . Drug use: No  . Sexual activity: Yes   Other Topics Concern  . 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  benzonatate (TESSALON) 100 MG capsule, Take 1 capsule (100 mg total) by mouth 3 (three) times daily as needed for cough., Disp: 20 capsule, Rfl: 0 .  Bioflavonoid Products (ESTER C PO), Take by mouth., Disp: , Rfl:  .  Biotin 10 MG CAPS, Take 1 capsule by mouth daily., Disp: , Rfl:  .  Calcium Carbonate-Vitamin D (CALCARB 600/D) 600-400 MG-UNIT per tablet,  Take 1 tablet by mouth daily.  , Disp: , Rfl:  .  Cholecalciferol (VITAMIN D3) 1000 UNITS CAPS, Take 1 capsule by mouth daily. Takes 2000 units daily, Disp: , Rfl:  .  levothyroxine (SYNTHROID) 75 MCG tablet, Take 1 tablet by mouth  daily before breakfast, Disp: 90 tablet, Rfl: 3 .  Omega-3 Fatty Acids (FISH OIL) 1000 MG CAPS, Take 3 capsules (3,000 mg total) by mouth daily., Disp: , Rfl: 0  EXAM:  There were no vitals filed for this visit.  There is no height or weight on file to calculate BMI.  GENERAL: vitals reviewed and listed above, alert, oriented, appears well hydrated and in no acute distress  HEENT: atraumatic, conjunttiva clear, no obvious abnormalities on inspection of external nose and ears  NECK: no obvious masses on inspection  LUNGS: clear to auscultation bilaterally, no wheezes, rales or rhonchi, good air movement  CV: HRRR, no peripheral edema  MS: moves all extremities without noticeable abnormality  PSYCH: pleasant and cooperative, no obvious depression or anxiety  ASSESSMENT AND PLAN:  Discussed the following assessment and plan:  No diagnosis found.  -labs -lifestyle recs -Patient advised to return or notify a doctor immediately if symptoms worsen or persist or new concerns  arise.  There are no Patient Instructions on file for this visit.  Colin Benton R., DO

## 2016-04-05 ENCOUNTER — Ambulatory Visit: Payer: Medicare Other | Admitting: Family Medicine

## 2016-04-09 ENCOUNTER — Ambulatory Visit (INDEPENDENT_AMBULATORY_CARE_PROVIDER_SITE_OTHER): Payer: Medicare Other | Admitting: Family Medicine

## 2016-04-09 VITALS — BP 112/70 | HR 85 | Temp 98.0°F | Wt 158.1 lb

## 2016-04-09 DIAGNOSIS — E785 Hyperlipidemia, unspecified: Secondary | ICD-10-CM

## 2016-04-09 DIAGNOSIS — E039 Hypothyroidism, unspecified: Secondary | ICD-10-CM

## 2016-04-09 DIAGNOSIS — E875 Hyperkalemia: Secondary | ICD-10-CM | POA: Diagnosis not present

## 2016-04-09 DIAGNOSIS — R899 Unspecified abnormal finding in specimens from other organs, systems and tissues: Secondary | ICD-10-CM

## 2016-04-09 DIAGNOSIS — R05 Cough: Secondary | ICD-10-CM

## 2016-04-09 DIAGNOSIS — R059 Cough, unspecified: Secondary | ICD-10-CM

## 2016-04-09 DIAGNOSIS — M810 Age-related osteoporosis without current pathological fracture: Secondary | ICD-10-CM

## 2016-04-09 DIAGNOSIS — R739 Hyperglycemia, unspecified: Secondary | ICD-10-CM | POA: Diagnosis not present

## 2016-04-09 LAB — LIPID PANEL
CHOLESTEROL: 197 mg/dL (ref 0–200)
HDL: 60 mg/dL (ref >39.00)
LDL CALC: 123 mg/dL — AB (ref 0–99)
NonHDL: 136.74
TRIGLYCERIDES: 71 mg/dL (ref 0.0–149.0)
Total CHOL/HDL Ratio: 3
VLDL: 14.2 mg/dL (ref 0.0–40.0)

## 2016-04-09 LAB — BASIC METABOLIC PANEL
BUN: 31 mg/dL — ABNORMAL HIGH (ref 6–23)
CHLORIDE: 109 meq/L (ref 96–112)
CO2: 27 mEq/L (ref 19–32)
Calcium: 9.5 mg/dL (ref 8.4–10.5)
Creatinine, Ser: 1.06 mg/dL (ref 0.40–1.20)
GFR: 53.22 mL/min — ABNORMAL LOW (ref >60.00)
Glucose, Bld: 92 mg/dL (ref 70–99)
POTASSIUM: 5.6 meq/L — AB (ref 3.5–5.1)
Sodium: 142 mEq/L (ref 135–145)

## 2016-04-09 LAB — HEMOGLOBIN A1C: HEMOGLOBIN A1C: 5.7 % (ref 4.6–6.5)

## 2016-04-09 LAB — TSH: TSH: 1.28 u[IU]/mL (ref 0.35–4.50)

## 2016-04-09 NOTE — Progress Notes (Signed)
HPI:  Nancy [name] is a pleasant 80 yo here for follow up. She sees Dr. Percell Miller for a recovering shoulder fx and Dr. Layne Benton for osteoporosis - treating with prolia.. She has a history of hypothyroidism, stable for many years per her report. She has a history of mild hyperlipidemia, mild hyperglycemia and mild hyperkalemia on her last set of labs. Doing well except for occ cough still which has lingered after a recent URI. No fevers, chills, sob, wheezing. She wants refill on tessalon.    ROS: See pertinent positives and negatives per HPI.  Past Medical History:  Diagnosis Date  . Allergy   . Chronic cough    saw several specialists per her report  . Hypothyroidism   . Osteoporosis    Sees Dr. Layne Benton at [facility], on prolia    Past Surgical History:  Procedure Laterality Date  . ABDOMINAL HYSTERECTOMY  1975  . APPENDECTOMY    . LAPAROSCOPY     removal of ovaries  . OOPHORECTOMY    . removal growth thyroid  1998  . TONSILLECTOMY      Family History  Problem Relation Age of Onset  . Dementia Mother   . Colon cancer Mother 59  . Parkinsonism Father   . Osteoporosis    . Colon cancer Sister 2  . Stomach cancer Neg Hx     Social History   Social History  . Marital status: Married    Spouse name: N/A  . Number of children: N/A  . Years of education: N/A   Social History Main Topics  . Smoking status: Never Smoker  . Smokeless tobacco: Never Used  . Alcohol use No  . Drug use: No  . Sexual activity: Yes   Other Topics Concern  . 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  benzonatate (TESSALON) 100 MG capsule, Take 1 capsule (100 mg total) by mouth 3 (three) times daily as needed for cough., Disp: 20 capsule, Rfl: 0 .  Bioflavonoid  Products (ESTER C PO), Take by mouth., Disp: , Rfl:  .  Biotin 10 MG CAPS, Take 1 capsule by mouth daily., Disp: , Rfl:  .  Calcium Carbonate-Vitamin D (CALCARB 600/D) 600-400 MG-UNIT per tablet, Take 1 tablet by mouth daily.  , Disp: , Rfl:  .  Cholecalciferol (VITAMIN D3) 1000 UNITS CAPS, Take 1 capsule by mouth daily. Takes 2000 units daily, Disp: , Rfl:  .  levothyroxine (SYNTHROID) 75 MCG tablet, Take 1 tablet by mouth  daily before breakfast, Disp: 90 tablet, Rfl: 3 .  Omega-3 Fatty Acids (FISH OIL) 1000 MG CAPS, Take 3 capsules (3,000 mg total) by mouth daily., Disp: , Rfl: 0  EXAM:  Vitals:   04/09/16 1921  BP: 112/70  Pulse: 85  Temp: 98 F (36.7 C)    Wt 158.1 T 98 P 85 BP 112/70  Body mass index is 25.52 kg/m. See scanned document for vitals,. Computer down. GENERAL: vitals reviewed and listed above, alert, oriented, appears well hydrated and in no acute distress  HEENT: atraumatic, conjunttiva clear, no obvious abnormalities on inspection of external nose and ears  NECK: no obvious masses on inspection  LUNGS: clear  to auscultation bilaterally, no wheezes, rales or rhonchi, good air movement  CV: HRRR, no peripheral edema  MS: moves all extremities without noticeable abnormality  PSYCH: pleasant and cooperative, no obvious depression or anxiety  ASSESSMENT AND PLAN:  Discussed the following assessment and plan:  Hyperlipidemia, unspecified hyperlipidemia type - Plan: Lipid panel  Hypothyroidism, adult - Plan: TSH  Hyperkalemia - Plan: Basic metabolic panel  Hyperglycemia - Plan: Hemoglobin A1c  Cough  Hypothyroidism, unspecified type  Osteoporosis, unspecified osteoporosis type, unspecified pathological fracture presence  -computers all down at visit - written rx fo tessalon provided, advised cxr if cough persists, she agreed to call if not resolved in 1-2 weeks or if worsening -labs -lifestyle recs -no avs given as no access to epic at  visit   There are no Patient Instructions on file for this visit.  Colin Benton R., DO

## 2016-04-10 NOTE — Addendum Note (Signed)
Addended by: Agnes Lawrence on: 04/10/2016 03:17 PM   Modules accepted: Orders

## 2016-04-24 ENCOUNTER — Other Ambulatory Visit (INDEPENDENT_AMBULATORY_CARE_PROVIDER_SITE_OTHER): Payer: Medicare Other

## 2016-04-24 ENCOUNTER — Other Ambulatory Visit: Payer: Medicare Other

## 2016-04-24 DIAGNOSIS — R899 Unspecified abnormal finding in specimens from other organs, systems and tissues: Secondary | ICD-10-CM

## 2016-04-24 LAB — BASIC METABOLIC PANEL
BUN: 30 mg/dL — ABNORMAL HIGH (ref 6–23)
CO2: 29 mEq/L (ref 19–32)
Calcium: 9.5 mg/dL (ref 8.4–10.5)
Chloride: 107 mEq/L (ref 96–112)
Creatinine, Ser: 1.17 mg/dL (ref 0.40–1.20)
GFR: 47.49 mL/min — AB (ref >60.00)
Glucose, Bld: 116 mg/dL — ABNORMAL HIGH (ref 70–99)
POTASSIUM: 5 meq/L (ref 3.5–5.1)
SODIUM: 141 meq/L (ref 135–145)

## 2016-06-02 IMAGING — DX DG CHEST 1V
2 series · 2 of 2 positions shown · non-contrast
Comparison: None.

CLINICAL DATA: Fall from standing, initial encounter.

EXAM:
CHEST - 1 VIEW

[chest ap (1 of 2)]
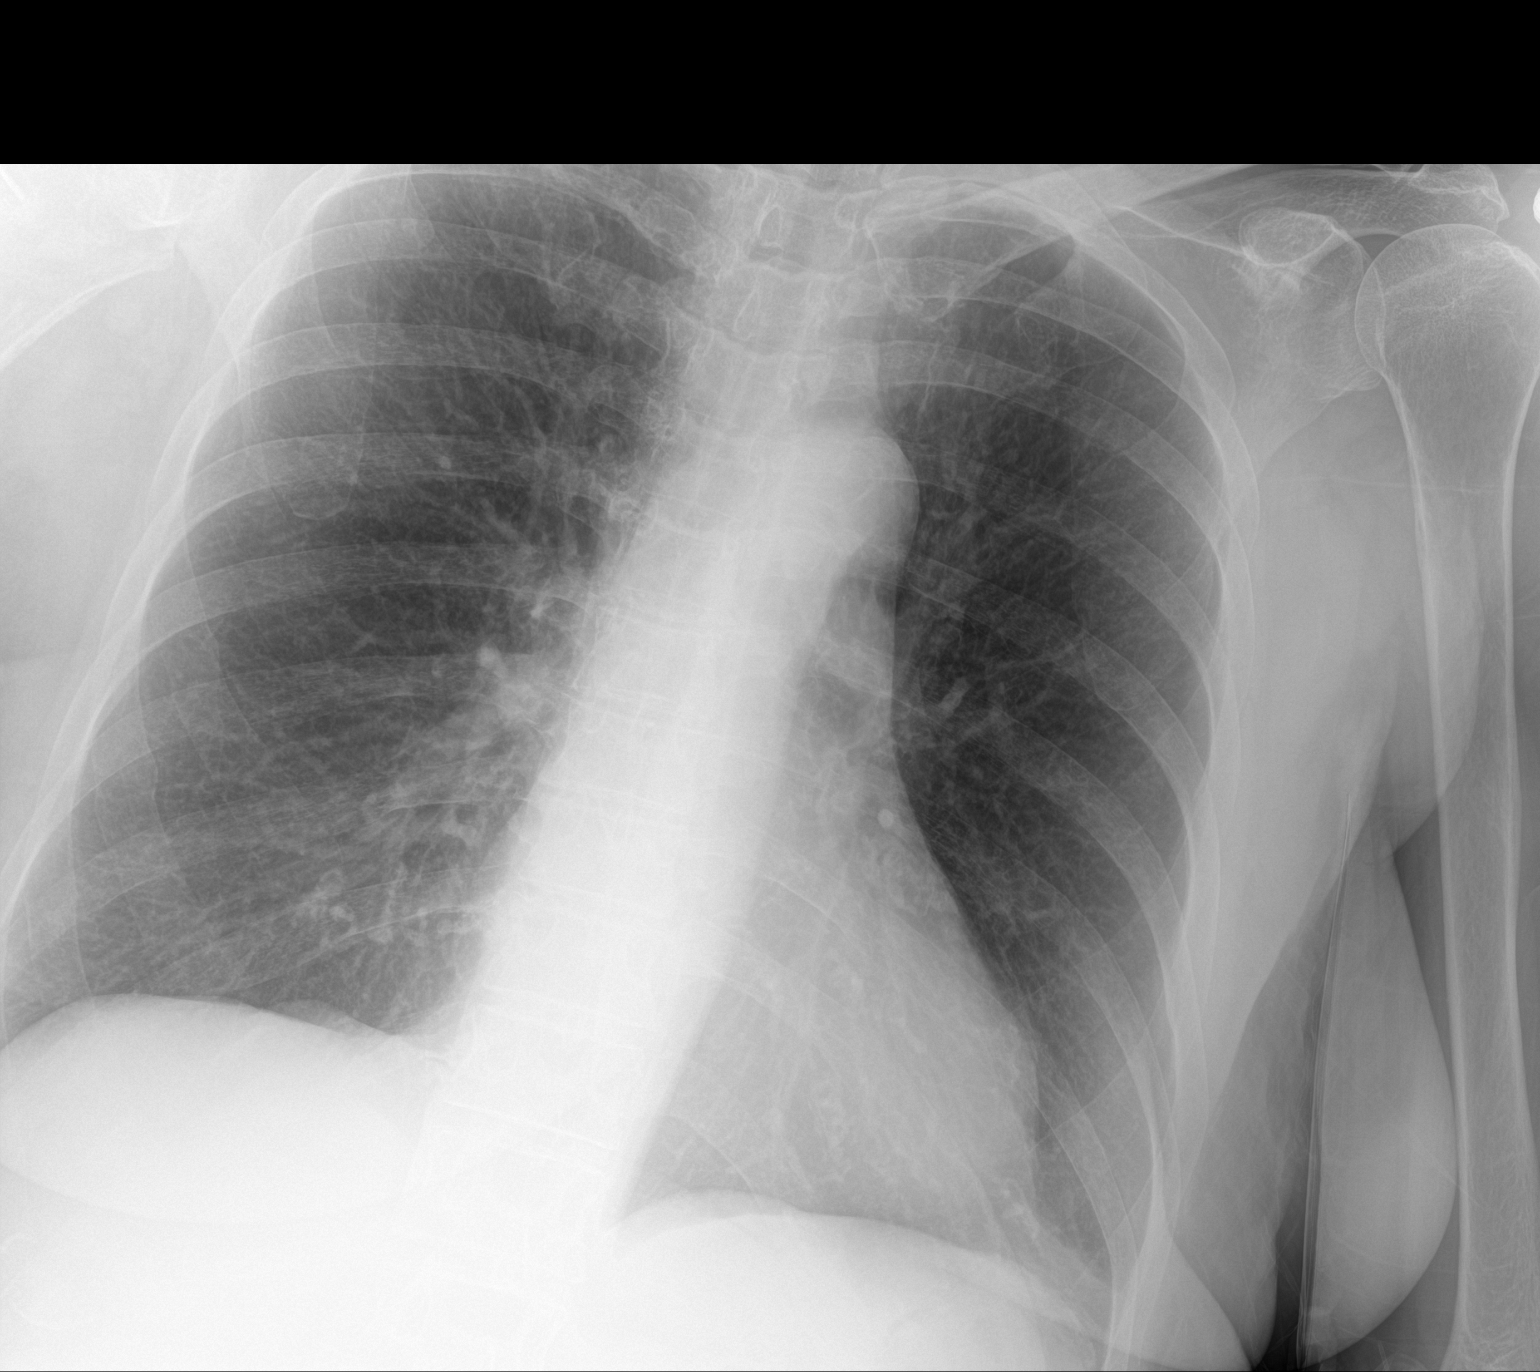

[chest ap (2 of 2)]
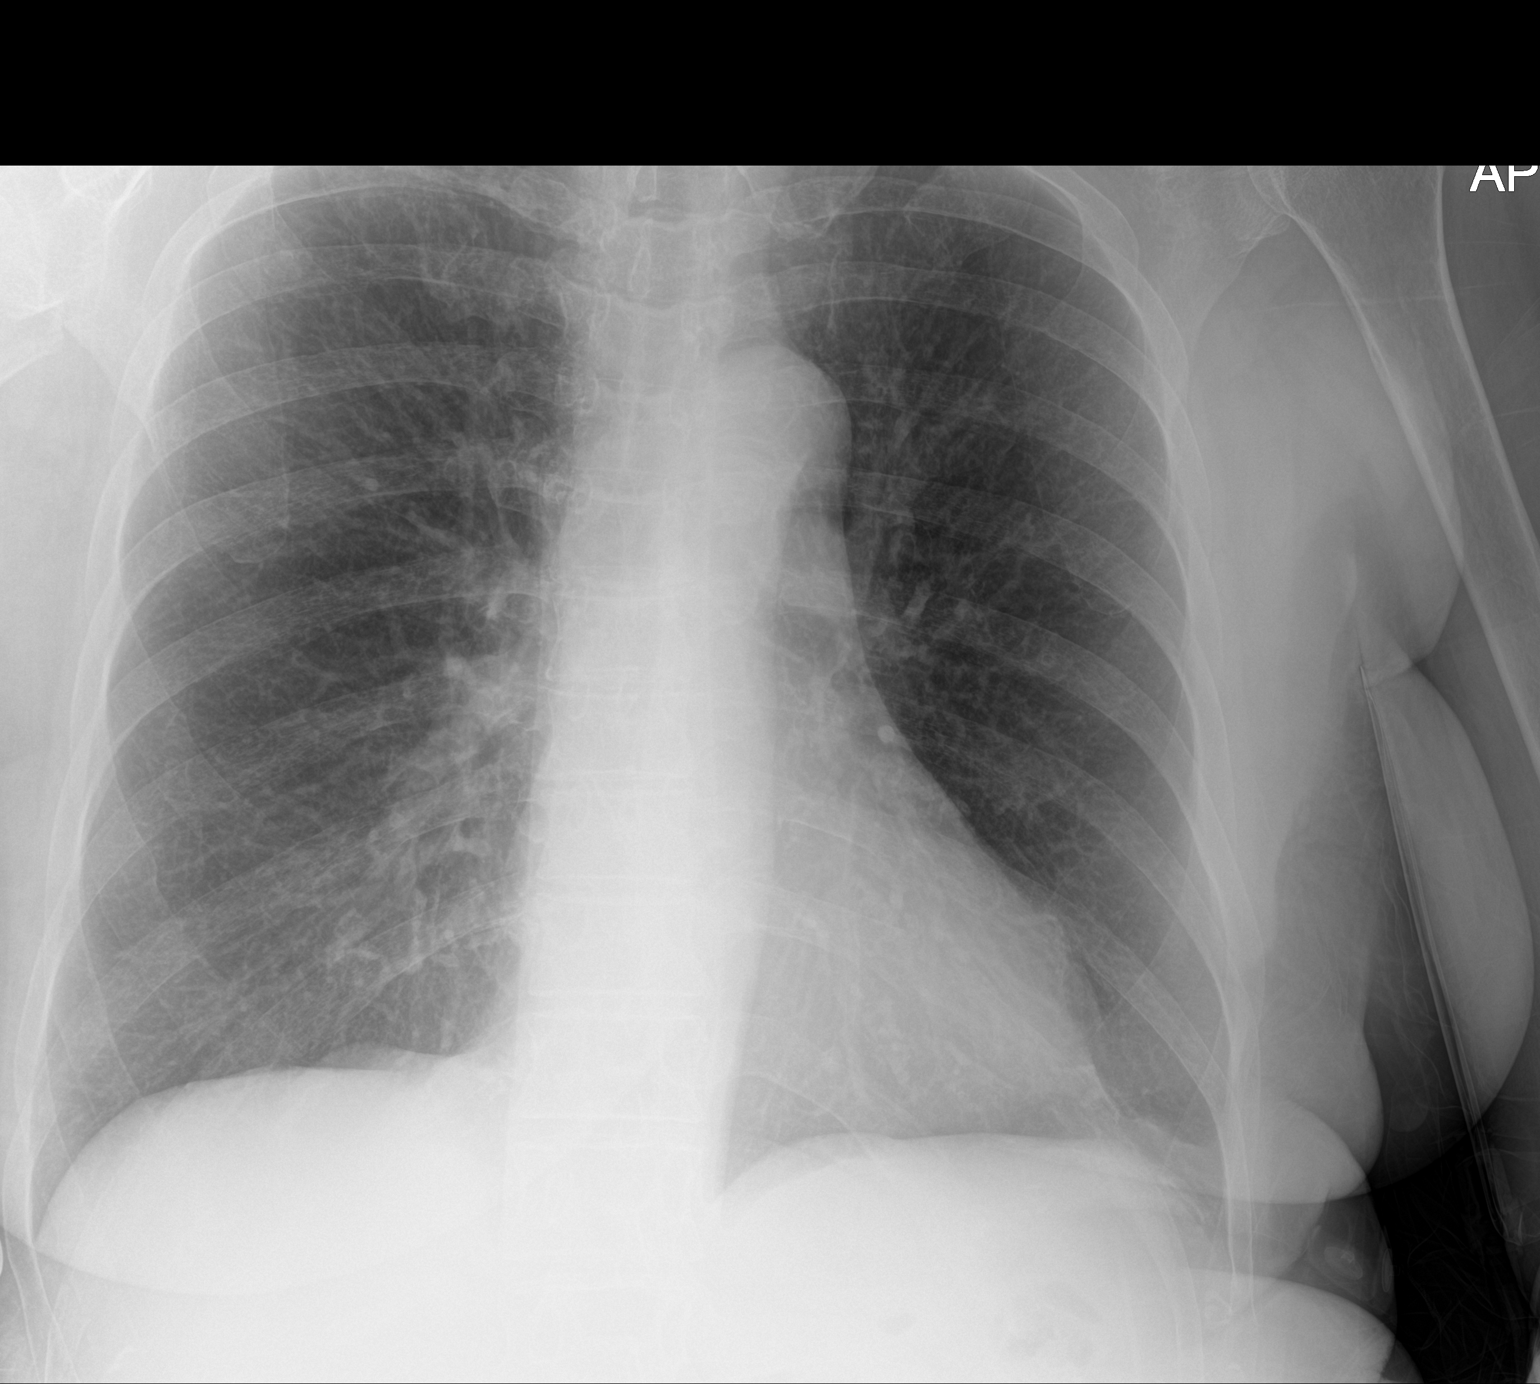

[2 of 2 positions shown; findings below may reference images not displayed]

FINDINGS: The heart size and mediastinal contours are within normal limits.
Both lungs are clear. No pneumothorax or pleural effusion is noted.
The visualized skeletal structures are unremarkable.
IMPRESSION: No acute cardiopulmonary abnormality seen.

## 2016-06-06 DIAGNOSIS — L900 Lichen sclerosus et atrophicus: Secondary | ICD-10-CM | POA: Diagnosis not present

## 2016-06-06 DIAGNOSIS — Z1231 Encounter for screening mammogram for malignant neoplasm of breast: Secondary | ICD-10-CM | POA: Diagnosis not present

## 2016-07-13 DIAGNOSIS — H5203 Hypermetropia, bilateral: Secondary | ICD-10-CM | POA: Diagnosis not present

## 2016-07-13 DIAGNOSIS — H2513 Age-related nuclear cataract, bilateral: Secondary | ICD-10-CM | POA: Diagnosis not present

## 2016-07-30 ENCOUNTER — Other Ambulatory Visit: Payer: Medicare Other

## 2016-08-03 ENCOUNTER — Other Ambulatory Visit (INDEPENDENT_AMBULATORY_CARE_PROVIDER_SITE_OTHER): Payer: Medicare Other

## 2016-08-03 DIAGNOSIS — R899 Unspecified abnormal finding in specimens from other organs, systems and tissues: Secondary | ICD-10-CM | POA: Diagnosis not present

## 2016-08-03 LAB — BASIC METABOLIC PANEL
BUN: 27 mg/dL — ABNORMAL HIGH (ref 6–23)
CHLORIDE: 106 meq/L (ref 96–112)
CO2: 27 meq/L (ref 19–32)
CREATININE: 1.06 mg/dL (ref 0.40–1.20)
Calcium: 9.1 mg/dL (ref 8.4–10.5)
GFR: 53.18 mL/min — ABNORMAL LOW (ref >60.00)
Glucose, Bld: 82 mg/dL (ref 70–99)
Potassium: 4.9 mEq/L (ref 3.5–5.1)
Sodium: 138 mEq/L (ref 135–145)

## 2016-08-06 DIAGNOSIS — M810 Age-related osteoporosis without current pathological fracture: Secondary | ICD-10-CM | POA: Diagnosis not present

## 2016-08-06 DIAGNOSIS — R5383 Other fatigue: Secondary | ICD-10-CM | POA: Diagnosis not present

## 2016-08-06 DIAGNOSIS — E559 Vitamin D deficiency, unspecified: Secondary | ICD-10-CM | POA: Diagnosis not present

## 2016-08-07 DIAGNOSIS — L570 Actinic keratosis: Secondary | ICD-10-CM | POA: Diagnosis not present

## 2016-08-28 DIAGNOSIS — L570 Actinic keratosis: Secondary | ICD-10-CM | POA: Diagnosis not present

## 2016-08-30 ENCOUNTER — Telehealth: Payer: Self-pay | Admitting: Family Medicine

## 2016-08-30 NOTE — Telephone Encounter (Signed)
° ° ° ° °  Correction pt said she never received  blood work from  08/03/2016

## 2016-08-30 NOTE — Telephone Encounter (Signed)
Pt would like blood work results from nov 2017

## 2016-08-30 NOTE — Telephone Encounter (Signed)
I called the pt and informed her of the results and that these were sent to her Mychart acct by Dr Maudie Mercury on 2/16.

## 2016-09-12 DIAGNOSIS — E559 Vitamin D deficiency, unspecified: Secondary | ICD-10-CM | POA: Diagnosis not present

## 2016-09-12 DIAGNOSIS — M810 Age-related osteoporosis without current pathological fracture: Secondary | ICD-10-CM | POA: Diagnosis not present

## 2016-10-08 ENCOUNTER — Other Ambulatory Visit: Payer: Self-pay | Admitting: Family Medicine

## 2016-10-18 DIAGNOSIS — R5383 Other fatigue: Secondary | ICD-10-CM | POA: Diagnosis not present

## 2016-10-18 DIAGNOSIS — M791 Myalgia: Secondary | ICD-10-CM | POA: Diagnosis not present

## 2016-10-18 DIAGNOSIS — E875 Hyperkalemia: Secondary | ICD-10-CM | POA: Diagnosis not present

## 2016-10-18 LAB — BASIC METABOLIC PANEL
CREATININE: 1.2 mg/dL — AB (ref <=1.1)
POTASSIUM: 4.6 mmol/L (ref 3.4–5.3)

## 2016-10-26 ENCOUNTER — Encounter: Payer: Self-pay | Admitting: Family Medicine

## 2017-01-24 ENCOUNTER — Other Ambulatory Visit: Payer: Self-pay | Admitting: Family Medicine

## 2017-02-13 NOTE — Progress Notes (Signed)
Subjective:   Nancy [name] is a 81 y.o. female who presents for Medicare Annual (Subsequent) preventive examination.  The Patient was informed that the wellness visit is to identify future health risk and educate and initiate measures that can reduce risk for increased disease through the lifespan.    Annual Wellness Assessment  Reports health as good Lives alone; one level Fell x 81 yo 2015 outside;   Preventive Screening -Counseling & Management  Medicare Annual Preventive Care Visit - Subsequent Last OV Oct 2017   Osteoporosis treating with Prolia Colonoscopy 06/2012 07/2010 mammogram dexa 10/2009 -2.4 - taking prolia Dr. Layne Benton; Ortho  Htn neg  IMM flu vaccine needed   VS reviewed;   Diet  Breakfast; eat egg waffle with light syrup w BB Lunch; eats a sandwich; or leftovers Does not eat enough green vegetables Cooks on the weekends And eats off that meal   BMI -27  Exercise Housework, yard work Lots of plants in the yard   [place] with sister; just got back from [place] 9[place]  Is  A seasoned traveler  Her biggest issue is sleep  States this has been an ongoing problem. She will sleep every third or fourth night fairly well. Once in a while will take sleep aids when traveling.      Cardiac Risk Factors Addressed Hyperlipidemia - Ratio 3; chol 197; hdl 60; ldl 123; trig 71 Diabetes -neg Pre-diabetes 5.7 A1c;    Advanced Directives she has completed an advanced directive living well and a no code.  Patient Care Team: Lucretia Kern, DO as PCP - General (Family Medicine) Bobbye Charleston, MD as Consulting Physician (Obstetrics and Gynecology) Ninetta Lights, MD as Consulting Physician (Orthopedic Surgery) Verner Chol, MD as Consulting Physician (Sports Medicine) Verner Chol, MD as Consulting Physician (Sports Medicine) Assessed for additional providers  Immunization History  Administered  Date(s) Administered  . Influenza Whole 04/17/2007, 04/06/2010  . Influenza, High Dose Seasonal PF 05/27/2015, 03/15/2016  . Influenza,inj,Quad PF,6+ Mos 02/23/2013  . Influenza-Unspecified 03/17/2014  . Pneumococcal Conjugate-13 05/27/2015  . Pneumococcal Polysaccharide-23 04/14/2008  . Td 06/18/2005, 03/18/2009  . Zoster 04/27/2008   Required Immunizations needed today  Screening test up to date or reviewed for plan of completion Health Maintenance Due  Topic Date Due  . INFLUENZA VACCINE  01/16/2017     Will postpone until end of Sept          Objective:     Vitals: BP 100/60   Pulse 77   Ht 5\' 6"  (1.676 m)   Wt 159 lb (72.1 kg)   SpO2 96%   BMI 25.66 kg/m   Body mass index is 25.66 kg/m.   Tobacco History  Smoking Status  . Never Smoker  Smokeless Tobacco  . Never Used     Counseling given: Yes   Past Medical History:  Diagnosis Date  . Allergy   . Chronic cough    saw several specialists per her report  . Hypothyroidism   . Osteoporosis    Sees Dr. Layne Benton at [facility], on prolia   Past Surgical History:  Procedure Laterality Date  . ABDOMINAL HYSTERECTOMY  1975  . APPENDECTOMY    . LAPAROSCOPY     removal of ovaries  . OOPHORECTOMY    . removal growth thyroid  1998  . TONSILLECTOMY     Family History  Problem Relation Age of Onset  . Dementia Mother   .  Colon cancer Mother 40  . Parkinsonism Father   . Osteoporosis Unknown   . Colon cancer Sister 68  . Stomach cancer Neg Hx    History  Sexual Activity  . Sexual activity: Yes    Outpatient Encounter Prescriptions as of 02/14/2017  Medication Sig  . benzonatate (TESSALON) 100 MG capsule Take 1 capsule (100 mg total) by mouth 3 (three) times daily as needed for cough.  Marland Kitchen Bioflavonoid Products (ESTER C PO) Take by mouth.  . Biotin 10 MG CAPS Take 1 capsule by mouth daily.  . Calcium Carbonate-Vitamin D (CALCARB 600/D) 600-400 MG-UNIT per tablet Take 1 tablet by mouth  daily.    . Cholecalciferol (VITAMIN D3) 1000 UNITS CAPS Take 1 capsule by mouth daily. Takes 2000 units daily  . Omega-3 Fatty Acids (FISH OIL) 1000 MG CAPS Take 3 capsules (3,000 mg total) by mouth daily.  . Probiotic Product (PROBIOTIC-10 PO) Take by mouth.  . SYNTHROID 75 MCG tablet TAKE 1 TABLET BY MOUTH  DAILY BEFORE BREAKFAST   No facility-administered encounter medications on file as of 02/14/2017.     Activities of Daily Living In your present state of health, do you have any difficulty performing the following activities: 02/14/2017  Hearing? N  Vision? N  Difficulty concentrating or making decisions? N  Walking or climbing stairs? N  Dressing or bathing? N  Doing errands, shopping? N  Preparing Food and eating ? N  Using the Toilet? N  In the past six months, have you accidently leaked urine? N  Do you have problems with loss of bowel control? N  Managing your Medications? N  Managing your Finances? N  Housekeeping or managing your Housekeeping? N  Some recent data might be hidden    Patient Care Team: Lucretia Kern, DO as PCP - General (Family Medicine) Bobbye Charleston, MD as Consulting Physician (Obstetrics and Gynecology) Ninetta Lights, MD as Consulting Physician (Orthopedic Surgery) Verner Chol, MD as Consulting Physician (Sports Medicine) Verner Chol, MD as Consulting Physician (Sports Medicine)    Assessment:     Exercise Activities and Dietary recommendations Current Exercise Habits: Home exercise routine, Time (Minutes): 45, Frequency (Times/Week): 5, Weekly Exercise (Minutes/Week): 225, Intensity: Mild (is not sedentary and is up and about )  Goals    . patient          Will continue to travel several times a year       Fall Risk Fall Risk  02/14/2017 10/05/2015 04/29/2014 02/23/2013  Falls in the past year? No No No No   Depression Screen PHQ 2/9 Scores 02/14/2017 10/05/2015 04/29/2014 02/23/2013  PHQ - 2 Score 0 0 0 1      Cognitive Function        Immunization History  Administered Date(s) Administered  . Influenza Whole 04/17/2007, 04/06/2010  . Influenza, High Dose Seasonal PF 05/27/2015, 03/15/2016  . Influenza,inj,Quad PF,6+ Mos 02/23/2013  . Influenza-Unspecified 03/17/2014  . Pneumococcal Conjugate-13 05/27/2015  . Pneumococcal Polysaccharide-23 04/14/2008  . Td 06/18/2005, 03/18/2009  . Zoster 04/27/2008   Screening Tests Health Maintenance  Topic Date Due  . INFLUENZA VACCINE  01/16/2017  . TETANUS/TDAP  03/19/2019  . DEXA SCAN  Completed  . PNA vac Low Risk Adult  Completed      Plan:     PCP Notes   Health Maintenance Postponing her flu vaccine until the end of September. Continues on presently per her orthopedic doctor. Educated regarding the shingrix and will plan to  take that this year.  Abnormal Screens  Denies any abnormal screening as hearing vision etc.  Referrals  No referrals made today  Patient concerns; Patient states she has not had a TSH drawn. Last one was in October 2017  Verbal order given to draw TSH today by Dr. Maudie Mercury  Nurse Concerns; No concerns  Next PCP apt TSH drawn this morning. All labs are due in October around the 24th. The patient is aware to schedule with Dr. Maudie Mercury.      I have personally reviewed and noted the following in the patient's chart:   . Medical and social history . Use of alcohol, tobacco or illicit drugs  . Current medications and supplements . Functional ability and status . Nutritional status . Physical activity . Advanced directives . List of other physicians . Hospitalizations, surgeries, and ER visits in previous 12 months . Vitals . Screenings to include cognitive, depression, and falls . Referrals and appointments  In addition, I have reviewed and discussed with patient certain preventive protocols, quality metrics, and best practice recommendations. A written personalized care plan for preventive services  as well as general preventive health recommendations were provided to patient.     Wynetta Fines, RN  02/14/2017

## 2017-02-14 ENCOUNTER — Ambulatory Visit (INDEPENDENT_AMBULATORY_CARE_PROVIDER_SITE_OTHER): Payer: Medicare Other

## 2017-02-14 VITALS — BP 100/60 | HR 77 | Ht 66.0 in | Wt 159.0 lb

## 2017-02-14 DIAGNOSIS — Z0000 Encounter for general adult medical examination without abnormal findings: Secondary | ICD-10-CM | POA: Diagnosis not present

## 2017-02-14 DIAGNOSIS — E039 Hypothyroidism, unspecified: Secondary | ICD-10-CM

## 2017-02-14 LAB — TSH: TSH: 1.13 u[IU]/mL (ref 0.35–4.50)

## 2017-02-14 NOTE — Progress Notes (Signed)
KIM, HANNAH R., DO  

## 2017-02-14 NOTE — Patient Instructions (Addendum)
Nancy [name] , Thank you for taking time to come for your Medicare Wellness Visit. I appreciate your ongoing commitment to your health goals. Please review the following plan we discussed and let me know if I can assist you in the future.   Shingrix is a vaccine for the prevention of Shingles in Adults 50 and older.  If you are on Medicare, you can request a prescription from your doctor to be filled at a pharmacy.  Please check with your benefits regarding applicable copays or out of pocket expenses.  The Shingrix is given in 2 vaccines approx 8 weeks apart. You must receive the 2nd dose prior to 6 months from receipt of the first.   Keep in mind the flu shot is an inactivated vaccine and takes at least 2 weeks to build immunity. The flu virus can be dormant for 4 days prior to symptoms Taking the flu shot at the beginning of the season can reduce the risk for the entire community.   Will take flu vaccine when she returns from her trip the last week in Sept    These are the goals we discussed: Goals    . patient          Will continue to travel several times a year        This is a list of the screening recommended for you and due dates:  Health Maintenance  Topic Date Due  . Flu Shot  01/16/2017  . Tetanus Vaccine  03/19/2019  . DEXA scan (bone density measurement)  Completed  . Pneumonia vaccines  Completed    Insomnia Insomnia is a sleep disorder that makes it difficult to fall asleep or to stay asleep. Insomnia can cause tiredness (fatigue), low energy, difficulty concentrating, mood swings, and poor performance at work or school. There are three different ways to classify insomnia:  Difficulty falling asleep.  Difficulty staying asleep.  Waking up too early in the morning.  Any type of insomnia can be long-term (chronic) or short-term (acute). Both are common. Short-term insomnia usually lasts for three months or less. Chronic insomnia occurs at least three times a week  for longer than three months. What are the causes? Insomnia may be caused by another condition, situation, or substance, such as:  Anxiety.  Certain medicines.  Gastroesophageal reflux disease (GERD) or other gastrointestinal conditions.  Asthma or other breathing conditions.  Restless legs syndrome, sleep apnea, or other sleep disorders.  Chronic pain.  Menopause. This may include hot flashes.  Stroke.  Abuse of alcohol, tobacco, or illegal drugs.  Depression.  Caffeine.  Neurological disorders, such as Alzheimer disease.  An overactive thyroid (hyperthyroidism).  The cause of insomnia may not be known. What increases the risk? Risk factors for insomnia include:  Gender. Women are more commonly affected than men.  Age. Insomnia is more common as you get older.  Stress. This may involve your professional or personal life.  Income. Insomnia is more common in people with lower income.  Lack of exercise.  Irregular work schedule or night shifts.  Traveling between different time zones.  What are the signs or symptoms? If you have insomnia, trouble falling asleep or trouble staying asleep is the main symptom. This may lead to other symptoms, such as:  Feeling fatigued.  Feeling nervous about going to sleep.  Not feeling rested in the morning.  Having trouble concentrating.  Feeling irritable, anxious, or depressed.  How is this treated? Treatment for insomnia depends on the  cause. If your insomnia is caused by an underlying condition, treatment will focus on addressing the condition. Treatment may also include:  Medicines to help you sleep.  Counseling or therapy.  Lifestyle adjustments.  Follow these instructions at home:  Take medicines only as directed by your health care provider.  Keep regular sleeping and waking hours. Avoid naps.  Keep a sleep diary to help you and your health care provider figure out what could be causing your insomnia.  Include: ? When you sleep. ? When you wake up during the night. ? How well you sleep. ? How rested you feel the next day. ? Any side effects of medicines you are taking. ? What you eat and drink.  Make your bedroom a comfortable place where it is easy to fall asleep: ? Put up shades or special blackout curtains to block light from outside. ? Use a white noise machine to block noise. ? Keep the temperature cool.  Exercise regularly as directed by your health care provider. Avoid exercising right before bedtime.  Use relaxation techniques to manage stress. Ask your health care provider to suggest some techniques that may work well for you. These may include: ? Breathing exercises. ? Routines to release muscle tension. ? Visualizing peaceful scenes.  Cut back on alcohol, caffeinated beverages, and cigarettes, especially close to bedtime. These can disrupt your sleep.  Do not overeat or eat spicy foods right before bedtime. This can lead to digestive discomfort that can make it hard for you to sleep.  Limit screen use before bedtime. This includes: ? Watching TV. ? Using your smartphone, tablet, and computer.  Stick to a routine. This can help you fall asleep faster. Try to do a quiet activity, brush your teeth, and go to bed at the same time each night.  Get out of bed if you are still awake after 15 minutes of trying to sleep. Keep the lights down, but try reading or doing a quiet activity. When you feel sleepy, go back to bed.  Make sure that you drive carefully. Avoid driving if you feel very sleepy.  Keep all follow-up appointments as directed by your health care provider. This is important. Contact a health care provider if:  You are tired throughout the day or have trouble in your daily routine due to sleepiness.  You continue to have sleep problems or your sleep problems get worse. Get help right away if:  You have serious thoughts about hurting yourself or someone  else. This information is not intended to replace advice given to you by your health care provider. Make sure you discuss any questions you have with your health care provider. Document Released: 06/01/2000 Document Revised: 11/04/2015 Document Reviewed: 03/05/2014 Elsevier Interactive Patient Education  2018 [employer].   Prevention of falls: Remove rugs or any tripping hazards in the home Use Non slip mats in bathtubs and showers Placing grab bars next to the toilet and or shower Placing handrails on both sides of the stair way Adding extra lighting in the home.   Personal safety issues reviewed:  1. Consider starting a community watch program per [facility] 2.  Changes batteries is smoke detector and/or carbon monoxide detector  3.  If you have firearms; keep them in a safe place 4.  Wear protection when in the sun; Always wear sunscreen or a hat; It is good to have your doctor check your skin annually or review any new areas of concern 5. Driving safety; Keep  in the right lane; stay 3 car lengths behind the car in front of you on the highway; look 3 times prior to pulling out; carry your cell phone everywhere you go!    Learn about the Yellow Dot program:  The program allows first responders at your emergency to have access to who your physician is, as well as your medications and medical conditions.  Citizens requesting the Yellow Dot Packages should contact Master Corporal Nunzio Cobbs at the [facility] [phone] for the first week of the program and beginning the week after Easter citizens should contact their Scientist, physiological.  Health Maintenance, Female Adopting a healthy lifestyle and getting preventive care can go a long way to promote health and wellness. Talk with your health care provider about what schedule of regular examinations is right for you. This is a good chance for you to check in with your provider about disease  prevention and staying healthy. In between checkups, there are plenty of things you can do on your own. Experts have done a lot of research about which lifestyle changes and preventive measures are most likely to keep you healthy. Ask your health care provider for more information. Weight and diet Eat a healthy diet  Be sure to include plenty of vegetables, fruits, low-fat dairy products, and lean protein.  Do not eat a lot of foods high in solid fats, added sugars, or salt.  Get regular exercise. This is one of the most important things you can do for your health. ? Most adults should exercise for at least 150 minutes each week. The exercise should increase your heart rate and make you sweat (moderate-intensity exercise). ? Most adults should also do strengthening exercises at least twice a week. This is in addition to the moderate-intensity exercise.  Maintain a healthy weight  Body mass index (BMI) is a measurement that can be used to identify possible weight problems. It estimates body fat based on height and weight. Your health care provider can help determine your BMI and help you achieve or maintain a healthy weight.  For females 74 years of age and older: ? A BMI below 18.5 is considered underweight. ? A BMI of 18.5 to 24.9 is normal. ? A BMI of 25 to 29.9 is considered overweight. ? A BMI of 30 and above is considered obese.  Watch levels of cholesterol and blood lipids  You should start having your blood tested for lipids and cholesterol at 79 years of age, then have this test every 5 years.  You may need to have your cholesterol levels checked more often if: ? Your lipid or cholesterol levels are high. ? You are older than 79 years of age. ? You are at high risk for heart disease.  Cancer screening Lung Cancer  Lung cancer screening is recommended for adults 57-45 years old who are at high risk for lung cancer because of a history of smoking.  A yearly low-dose CT scan  of the lungs is recommended for people who: ? Currently smoke. ? Have quit within the past 15 years. ? Have at least a 30-pack-year history of smoking. A pack year is smoking an average of one pack of cigarettes a day for 1 year.  Yearly screening should continue until it has been 15 years since you quit.  Yearly screening should stop if you develop a health problem that would prevent you from having lung cancer treatment.  Breast Cancer  Practice breast self-awareness. This  means understanding how your breasts normally appear and feel.  It also means doing regular breast self-exams. Let your health care provider know about any changes, no matter how small.  If you are in your 20s or 30s, you should have a clinical breast exam (CBE) by a health care provider every 1-3 years as part of a regular health exam.  If you are 32 or older, have a CBE every year. Also consider having a breast X-ray (mammogram) every year.  If you have a family history of breast cancer, talk to your health care provider about genetic screening.  If you are at high risk for breast cancer, talk to your health care provider about having an MRI and a mammogram every year.  Breast cancer gene (BRCA) assessment is recommended for women who have family members with BRCA-related cancers. BRCA-related cancers include: ? Breast. ? Ovarian. ? Tubal. ? Peritoneal cancers.  Results of the assessment will determine the need for genetic counseling and BRCA1 and BRCA2 testing.  Cervical Cancer Your health care provider may recommend that you be screened regularly for cancer of the pelvic organs (ovaries, uterus, and vagina). This screening involves a pelvic examination, including checking for microscopic changes to the surface of your cervix (Pap test). You may be encouraged to have this screening done every 3 years, beginning at age 65.  For women ages 84-65, health care providers may recommend pelvic exams and Pap testing  every 3 years, or they may recommend the Pap and pelvic exam, combined with testing for human papilloma virus (HPV), every 5 years. Some types of HPV increase your risk of cervical cancer. Testing for HPV may also be done on women of any age with unclear Pap test results.  Other health care providers may not recommend any screening for nonpregnant women who are considered low risk for pelvic cancer and who do not have symptoms. Ask your health care provider if a screening pelvic exam is right for you.  If you have had past treatment for cervical cancer or a condition that could lead to cancer, you need Pap tests and screening for cancer for at least 20 years after your treatment. If Pap tests have been discontinued, your risk factors (such as having a new sexual partner) need to be reassessed to determine if screening should resume. Some women have medical problems that increase the chance of getting cervical cancer. In these cases, your health care provider may recommend more frequent screening and Pap tests.  Colorectal Cancer  This type of cancer can be detected and often prevented.  Routine colorectal cancer screening usually begins at 79 years of age and continues through 79 years of age.  Your health care provider may recommend screening at an earlier age if you have risk factors for colon cancer.  Your health care provider may also recommend using home test kits to check for hidden blood in the stool.  A small camera at the end of a tube can be used to examine your colon directly (sigmoidoscopy or colonoscopy). This is done to check for the earliest forms of colorectal cancer.  Routine screening usually begins at age 22.  Direct examination of the colon should be repeated every 5-10 years through 79 years of age. However, you may need to be screened more often if early forms of precancerous polyps or small growths are found.  Skin Cancer  Check your skin from head to toe  regularly.  Tell your health care provider about any new  moles or changes in moles, especially if there is a change in a mole's shape or color.  Also tell your health care provider if you have a mole that is larger than the size of a pencil eraser.  Always use sunscreen. Apply sunscreen liberally and repeatedly throughout the day.  Protect yourself by wearing long sleeves, pants, a wide-brimmed hat, and sunglasses whenever you are outside.  Heart disease, diabetes, and high blood pressure  High blood pressure causes heart disease and increases the risk of stroke. High blood pressure is more likely to develop in: ? People who have blood pressure in the high end of the normal range (130-139/85-89 mm Hg). ? People who are overweight or obese. ? People who are African American.  If you are 9-4 years of age, have your blood pressure checked every 3-5 years. If you are 41 years of age or older, have your blood pressure checked every year. You should have your blood pressure measured twice-once when you are at a hospital or clinic, and once when you are not at a hospital or clinic. Record the average of the two measurements. To check your blood pressure when you are not at a hospital or clinic, you can use: ? An automated blood pressure machine at a pharmacy. ? A home blood pressure monitor.  If you are between 46 years and 40 years old, ask your health care provider if you should take aspirin to prevent strokes.  Have regular diabetes screenings. This involves taking a blood sample to check your fasting blood sugar level. ? If you are at a normal weight and have a low risk for diabetes, have this test once every three years after 79 years of age. ? If you are overweight and have a high risk for diabetes, consider being tested at a younger age or more often. Preventing infection Hepatitis B  If you have a higher risk for hepatitis B, you should be screened for this virus. You are considered  at high risk for hepatitis B if: ? You were born in a country where hepatitis B is common. Ask your health care provider which countries are considered high risk. ? Your parents were born in a high-risk country, and you have not been immunized against hepatitis B (hepatitis B vaccine). ? You have HIV or AIDS. ? You use needles to inject street drugs. ? You live with someone who has hepatitis B. ? You have had sex with someone who has hepatitis B. ? You get hemodialysis treatment. ? You take certain medicines for conditions, including cancer, organ transplantation, and autoimmune conditions.  Hepatitis C  Blood testing is recommended for: ? Everyone born from [birth year] through 1963. ? Anyone with known risk factors for hepatitis C.  Sexually transmitted infections (STIs)  You should be screened for sexually transmitted infections (STIs) including gonorrhea and chlamydia if: ? You are sexually active and are younger than 79 years of age. ? You are older than 79 years of age and your health care provider tells you that you are at risk for this type of infection. ? Your sexual activity has changed since you were last screened and you are at an increased risk for chlamydia or gonorrhea. Ask your health care provider if you are at risk.  If you do not have HIV, but are at risk, it may be recommended that you take a prescription medicine daily to prevent HIV infection. This is called pre-exposure prophylaxis (PrEP). You are considered at risk if: ?  You are sexually active and do not regularly use condoms or know the HIV status of your partner(s). ? You take drugs by injection. ? You are sexually active with a partner who has HIV.  Talk with your health care provider about whether you are at high risk of being infected with HIV. If you choose to begin PrEP, you should first be tested for HIV. You should then be tested every 3 months for as long as you are taking PrEP. Pregnancy  If you are  premenopausal and you may become pregnant, ask your health care provider about preconception counseling.  If you may become pregnant, take 400 to 800 micrograms (mcg) of folic acid every day.  If you want to prevent pregnancy, talk to your health care provider about birth control (contraception). Osteoporosis and menopause  Osteoporosis is a disease in which the bones lose minerals and strength with aging. This can result in serious bone fractures. Your risk for osteoporosis can be identified using a bone density scan.  If you are 42 years of age or older, or if you are at risk for osteoporosis and fractures, ask your health care provider if you should be screened.  Ask your health care provider whether you should take a calcium or vitamin D supplement to lower your risk for osteoporosis.  Menopause may have certain physical symptoms and risks.  Hormone replacement therapy may reduce some of these symptoms and risks. Talk to your health care provider about whether hormone replacement therapy is right for you. Follow these instructions at home:  Schedule regular health, dental, and eye exams.  Stay current with your immunizations.  Do not use any tobacco products including cigarettes, chewing tobacco, or electronic cigarettes.  If you are pregnant, do not drink alcohol.  If you are breastfeeding, limit how much and how often you drink alcohol.  Limit alcohol intake to no more than 1 drink per day for nonpregnant women. One drink equals 12 ounces of beer, 5 ounces of wine, or 1 ounces of hard liquor.  Do not use street drugs.  Do not share needles.  Ask your health care provider for help if you need support or information about quitting drugs.  Tell your health care provider if you often feel depressed.  Tell your health care provider if you have ever been abused or do not feel safe at home. This information is not intended to replace advice given to you by your health care  provider. Make sure you discuss any questions you have with your health care provider. Document Released: 12/18/2010 Document Revised: 11/10/2015 Document Reviewed: 03/08/2015 Elsevier Interactive Patient Education  2018 [place] in the Home Falls can cause injuries and can affect people from all age groups. There are many simple things that you can do to make your home safe and to help prevent falls. What can I do on the outside of my home?  Regularly repair the edges of walkways and driveways and fix any cracks.  Remove high doorway thresholds.  Trim any shrubbery on the main path into your home.  Use bright outdoor lighting.  Clear walkways of debris and clutter, including tools and rocks.  Regularly check that handrails are securely fastened and in good repair. Both sides of any steps should have handrails.  Install guardrails along the edges of any raised decks or porches.  Have leaves, snow, and ice cleared regularly.  Use sand or salt on walkways during winter months.  In the garage, clean up any spills right away, including grease or oil spills. What can I do in the bathroom?  Use night lights.  Install grab bars by the toilet and in the tub and shower. Do not use towel bars as grab bars.  Use non-skid mats or decals on the floor of the tub or shower.  If you need to sit down while you are in the shower, use a plastic, non-slip stool.  Keep the floor dry. Immediately clean up any water that spills on the floor.  Remove soap buildup in the tub or shower on a regular basis.  Attach bath mats securely with double-sided non-slip rug tape.  Remove throw rugs and other tripping hazards from the floor. What can I do in the bedroom?  Use night lights.  Make sure that a bedside light is easy to reach.  Do not use oversized bedding that drapes onto the floor.  Have a firm chair that has side arms to use for getting dressed.  Remove throw  rugs and other tripping hazards from the floor. What can I do in the kitchen?  Clean up any spills right away.  Avoid walking on wet floors.  Place frequently used items in easy-to-reach places.  If you need to reach for something above you, use a sturdy step stool that has a grab bar.  Keep electrical cables out of the way.  Do not use floor polish or wax that makes floors slippery. If you have to use wax, make sure that it is non-skid floor wax.  Remove throw rugs and other tripping hazards from the floor. What can I do in the stairways?  Do not leave any items on the stairs.  Make sure that there are handrails on both sides of the stairs. Fix handrails that are broken or loose. Make sure that handrails are as long as the stairways.  Check any carpeting to make sure that it is firmly attached to the stairs. Fix any carpet that is loose or worn.  Avoid having throw rugs at the top or bottom of stairways, or secure the rugs with carpet tape to prevent them from moving.  Make sure that you have a light switch at the top of the stairs and the bottom of the stairs. If you do not have them, have them installed. What are some other fall prevention tips?  Wear closed-toe shoes that fit well and support your feet. Wear shoes that have rubber soles or low heels.  When you use a stepladder, make sure that it is completely opened and that the sides are firmly locked. Have someone hold the ladder while you are using it. Do not climb a closed stepladder.  Add color or contrast paint or tape to grab bars and handrails in your home. Place contrasting color strips on the first and last steps.  Use mobility aids as needed, such as canes, walkers, scooters, and crutches.  Turn on lights if it is dark. Replace any light bulbs that burn out.  Set up furniture so that there are clear paths. Keep the furniture in the same spot.  Fix any uneven floor surfaces.  Choose a carpet design that does  not hide the edge of steps of a stairway.  Be aware of any and all pets.  Review your medicines with your healthcare provider. Some medicines can cause dizziness or changes in blood pressure, which increase your risk of falling. Talk with your health care provider about other ways  that you can decrease your risk of falls. This may include working with a physical therapist or trainer to improve your strength, balance, and endurance. This information is not intended to replace advice given to you by your health care provider. Make sure you discuss any questions you have with your health care provider. Document Released: 05/25/2002 Document Revised: 11/01/2015 Document Reviewed: 07/09/2014 Elsevier Interactive Patient Education  2017 [employer].

## 2017-03-07 ENCOUNTER — Encounter: Payer: Self-pay | Admitting: Family Medicine

## 2017-03-13 ENCOUNTER — Telehealth: Payer: Self-pay | Admitting: Family Medicine

## 2017-03-13 NOTE — Telephone Encounter (Signed)
Pt would like to get #30 Rx Synthroid 75 MCG  Pharm:  [facility] on [address].  Pt is aware that Dr Maudie Mercury is out of the office and will address on Thursday 03/14/17.

## 2017-03-14 MED ORDER — LEVOTHYROXINE SODIUM 75 MCG PO TABS: ORAL_TABLET | ORAL | 5 refills | Status: DC

## 2017-03-14 NOTE — Telephone Encounter (Signed)
Rx done. 

## 2017-03-18 DIAGNOSIS — M810 Age-related osteoporosis without current pathological fracture: Secondary | ICD-10-CM | POA: Diagnosis not present

## 2017-03-18 DIAGNOSIS — R5383 Other fatigue: Secondary | ICD-10-CM | POA: Diagnosis not present

## 2017-03-18 DIAGNOSIS — E559 Vitamin D deficiency, unspecified: Secondary | ICD-10-CM | POA: Diagnosis not present

## 2017-03-18 DIAGNOSIS — Z23 Encounter for immunization: Secondary | ICD-10-CM | POA: Diagnosis not present

## 2017-03-20 DIAGNOSIS — M810 Age-related osteoporosis without current pathological fracture: Secondary | ICD-10-CM | POA: Diagnosis not present

## 2017-03-20 DIAGNOSIS — E559 Vitamin D deficiency, unspecified: Secondary | ICD-10-CM | POA: Diagnosis not present

## 2017-04-23 DIAGNOSIS — L570 Actinic keratosis: Secondary | ICD-10-CM | POA: Diagnosis not present

## 2017-06-27 ENCOUNTER — Encounter: Payer: Self-pay | Admitting: Family Medicine

## 2017-07-03 ENCOUNTER — Other Ambulatory Visit: Payer: Self-pay | Admitting: Family Medicine

## 2017-07-05 DIAGNOSIS — Z1231 Encounter for screening mammogram for malignant neoplasm of breast: Secondary | ICD-10-CM | POA: Diagnosis not present

## 2017-07-05 DIAGNOSIS — Z1289 Encounter for screening for malignant neoplasm of other sites: Secondary | ICD-10-CM | POA: Diagnosis not present

## 2017-07-05 LAB — HM PAP SMEAR

## 2017-07-05 NOTE — Telephone Encounter (Signed)
Dr. Maudie Mercury,  Pt did have a normal TSH on 02/14/17.  However, you have not see the pt in quite some time.  Please advise.  Has seen Ok Anis for [facility] exam.

## 2017-07-08 NOTE — Telephone Encounter (Signed)
Needs appointment.  Once appointment scheduled, can refill to appointment only.

## 2017-07-12 ENCOUNTER — Encounter: Payer: Self-pay | Admitting: Family Medicine

## 2017-08-21 DIAGNOSIS — H2513 Age-related nuclear cataract, bilateral: Secondary | ICD-10-CM | POA: Diagnosis not present

## 2017-08-21 DIAGNOSIS — D3131 Benign neoplasm of right choroid: Secondary | ICD-10-CM | POA: Diagnosis not present

## 2017-08-21 DIAGNOSIS — H5203 Hypermetropia, bilateral: Secondary | ICD-10-CM | POA: Diagnosis not present

## 2017-09-16 DIAGNOSIS — E559 Vitamin D deficiency, unspecified: Secondary | ICD-10-CM | POA: Diagnosis not present

## 2017-09-16 DIAGNOSIS — M810 Age-related osteoporosis without current pathological fracture: Secondary | ICD-10-CM | POA: Diagnosis not present

## 2017-09-16 DIAGNOSIS — R5383 Other fatigue: Secondary | ICD-10-CM | POA: Diagnosis not present

## 2017-09-19 DIAGNOSIS — M810 Age-related osteoporosis without current pathological fracture: Secondary | ICD-10-CM | POA: Diagnosis not present

## 2017-09-19 DIAGNOSIS — M25511 Pain in right shoulder: Secondary | ICD-10-CM | POA: Diagnosis not present

## 2017-09-26 DIAGNOSIS — M7541 Impingement syndrome of right shoulder: Secondary | ICD-10-CM | POA: Diagnosis not present

## 2017-09-26 DIAGNOSIS — M79642 Pain in left hand: Secondary | ICD-10-CM | POA: Diagnosis not present

## 2017-10-16 DIAGNOSIS — M25511 Pain in right shoulder: Secondary | ICD-10-CM | POA: Diagnosis not present

## 2017-10-23 DIAGNOSIS — M25511 Pain in right shoulder: Secondary | ICD-10-CM | POA: Diagnosis not present

## 2017-11-01 DIAGNOSIS — Z23 Encounter for immunization: Secondary | ICD-10-CM | POA: Diagnosis not present

## 2017-11-18 DIAGNOSIS — M25511 Pain in right shoulder: Secondary | ICD-10-CM | POA: Diagnosis not present

## 2017-12-02 DIAGNOSIS — M25511 Pain in right shoulder: Secondary | ICD-10-CM | POA: Diagnosis not present

## 2018-01-22 ENCOUNTER — Other Ambulatory Visit: Payer: Self-pay | Admitting: Orthopedic Surgery

## 2018-01-22 DIAGNOSIS — M25511 Pain in right shoulder: Secondary | ICD-10-CM | POA: Diagnosis not present

## 2018-01-28 ENCOUNTER — Ambulatory Visit
Admission: RE | Admit: 2018-01-28 | Discharge: 2018-01-28 | Disposition: A | Discharge status: Home / Self Care | Payer: Medicare Other | Source: Ambulatory Visit | Attending: Orthopedic Surgery | Admitting: Orthopedic Surgery

## 2018-01-28 DIAGNOSIS — M75121 Complete rotator cuff tear or rupture of right shoulder, not specified as traumatic: Secondary | ICD-10-CM | POA: Diagnosis not present

## 2018-01-28 DIAGNOSIS — M25511 Pain in right shoulder: Secondary | ICD-10-CM

## 2018-01-29 NOTE — Progress Notes (Signed)
No chief complaint on file.   HPI:  Patient is seen for optimization of general medical care prior to surgery. She is a fairly healthy and active 82 yo with a PMH listed below and updated for changes. She walks and get a lot of exercise and is in reported excellent physical shape. Surgery type:R shoulder surgery, Dr. Mardelle Matte for AVN R humerus- reports Fx 3 years ago, seeing Dr. Cyd Silence for osteoporosis, chronic pain this shoulder with certain movements and AVN recently discovered with recommendations for shoulder replacement. Date of surgery:TBD  Kidney disease? No per pt - cr mildly elevated once on recent labs  Prior surgeries/Issues following anesthesia? Reports a history of partial hysterectomy in 1975 and oophorectomy 10 years ago. Denies any complications except for post op NV Hx MI, heart arrythmia, CHF, angina or stroke? denies Epilepsy or Seizures? deneis Arthritis or problems with neck or jaw? denies Thyroid disease? Hypothyroid on on levothyroxine Liver disease? denies Asthma, COPD or chronic lung disease? denies Diabetes? denies (Needs to be evaluated by anesthesia if yes to these questions.)  Other: Poor nutrition, Frail or other: no  METS:  ?Can take care of self, such as eat, dress, or use the toilet (1 MET). yes ?Can walk up a flight of steps or a hill (4 METs).yes ?Can do heavy work around the house such as scrubbing floors or lifting or moving heavy furniture (between 4 and 10 METs). yes ?Can participate in strenuous sports such as swimming, singles tennis, football, basketball, and skiing (>10 METs) - n/a but very active traveler, walker . AHA Risks: Major predictors that require intensive management and may lead to delay in or cancellation of the operative procedure unless emergent: NONE  . Unstable coronary syndromes including unstable or severe angina or recent MI  . Decompensated heart failure including NYHA functional class IV or worsening or new-onset HF  .  Significant arrhythmias including high grade AV block, symptomatic ventricular arrhythmias, supraventricular arrhythmias with ventricular rate >100 bpm at rest, symptomatic bradycardia, and newly recognized ventricular tachycardia  . Severe heart valve disease including severe aortic stenosis or symptomatic mitral stenosis   Other clinical predictors that warrant careful assessment of current status: NONE  . History of ischemic heart disease . History of cerebrovascular disease  . History of compensated heart failure or prior heart failure  . Diabetes mellitus  . Renal insufficiency  Type of surgery and Risk: 1) High risk (reported risk of cardiac death or nonfatal myocardial infarction [MI] often greater than 5 percent):  Marland Kitchen Aortic and other major vascular surgery  . Peripheral artery surgery   2)Intermediate risk (reported risk of cardiac death or nonfatal MI generally 1 to 5 percent):  Marland Kitchen Carotid endarterectomy  . Head and neck surgery  . Intraperitoneal and intrathoracic surgery  . Orthopedic surgery  . Prostate surgery   3)Low risk (reported risk of cardiac death or nonfatal MI generally less than 1 percent):  Marland Kitchen Ambulatory surgery  . Endoscopic procedures  . Superficial procedure  . Cataract surgery  . Breast surgery  Medications that need to be addressed prior to surgery: take fish oil - reports she will hold one week prior to surgery; orthopedic doctor started meloxicam and provided instructions for this and for all medications perioperatively  Discontinue acei/arbs/non-statin lipid lowering drugs day of surgery ASA stop 7 days before or discuss with cardiology if CV risks, other anticoagulants discuss with cardiology.  ROS: See pertinent positives and negatives per HPI. 11 point ROS negative except where  noted.  Past Medical History:  Diagnosis Date  . Allergy   . Chronic cough    saw several specialists per her report  . Hypothyroidism   . Osteoporosis    Sees Dr.  Layne Benton at [facility], on prolia    Past Surgical History:  Procedure Laterality Date  . ABDOMINAL HYSTERECTOMY  1975  . APPENDECTOMY    . LAPAROSCOPY     removal of ovaries  . OOPHORECTOMY    . removal growth thyroid  1998  . TONSILLECTOMY      Family History  Problem Relation Age of Onset  . Dementia Mother   . Colon cancer Mother 44  . Parkinsonism Father   . Osteoporosis Unknown   . Colon cancer Sister 13  . Stomach cancer Neg Hx     Social History   Socioeconomic History  . Marital status: Married    Spouse name: Not on file  . Number of children: Not on file  . Years of education: Not on file  . Highest education level: Not on file  Occupational History  . Not on file  Social Needs  . Financial resource strain: Not on file  . Food insecurity:    Worry: Not on file    Inability: Not on file  . Transportation needs:    Medical: Not on file    Non-medical: Not on file  Tobacco Use  . Smoking status: Never Smoker  . Smokeless tobacco: Never Used  Substance and Sexual Activity  . Alcohol use: No  . Drug use: No  . Sexual activity: Yes  Lifestyle  . Physical activity:    Days per week: Not on file    Minutes per session: Not on file  . Stress: Not on file  Relationships  . Social connections:    Talks on phone: Not on file    Gets together: Not on file    Attends religious service: Not on file    Active member of club or organization: Not on file    Attends meetings of clubs or organizations: Not on file    Relationship status: Not on file  Other Topics Concern  . 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  benzonatate (TESSALON) 100 MG capsule, Take 1 capsule (100 mg total) by mouth 3 (three) times daily as needed for cough., Disp:  20 capsule, Rfl: 0 .  Bioflavonoid Products (ESTER C PO), Take by mouth., Disp: , Rfl:  .  Biotin 10 MG CAPS, Take 1 capsule by mouth daily., Disp: , Rfl:  .  Calcium Carbonate-Vitamin D (CALCARB 600/D) 600-400 MG-UNIT per tablet, Take 1 tablet by mouth daily.  , Disp: , Rfl:  .  Cholecalciferol (VITAMIN D3) 1000 UNITS CAPS, Take 1 capsule by mouth daily. Takes 2000 units daily, Disp: , Rfl:  .  levothyroxine (SYNTHROID) 75 MCG tablet, TAKE 1 TABLET BY MOUTH  DAILY BEFORE BREAKFAST, Disp: 90 tablet, Rfl: 1 .  meloxicam (MOBIC) 15 MG tablet, TAKE 1 TABLET BY MOUTH EVERY DAY WITH FOOD FOR 2 WEEKS, THEN TAKE AS NEEDED, Disp: , Rfl: 1 .  Omega-3 Fatty Acids (FISH OIL) 1000 MG CAPS, Take 3 capsules (3,000 mg total) by mouth  daily., Disp: , Rfl: 0 .  Probiotic Product (PROBIOTIC-10 PO), Take by mouth., Disp: , Rfl:   EXAM:  Vitals:   01/30/18 0913  BP: 120/76  Pulse: 73  Temp: 97.9 F (36.6 C)    Body mass index is 25.94 kg/m.  GENERAL: vitals reviewed and listed above, alert, oriented, appears well hydrated and in no acute distress  HEENT: atraumatic, conjunttiva clear, no obvious abnormalities on inspection of external nose and ears  NECK: no obvious masses on inspection, no carotid bruits  LUNGS: clear to auscultation bilaterally, no wheezes, rales or rhonchi, good air movement  CV: HRRR, no peripheral edema, no JVD, BP normal range, normal radial pulses  MS: moves all extremities without noticeable abnormality  PSYCH: pleasant and cooperative, no obvious depression or anxiety  ASSESSMENT AND PLAN:  Discussed the following assessment and plan: More than 50% of over 40 minutes spent in total in caring for this patient was spent face-to-face with the patient, counseling and/or coordinating care.    Pre-op evaluation - Plan: Basic metabolic panel, CBC  Screening for depression  Hyperlipidemia, unspecified hyperlipidemia type - Plan: Lipid panel  Hypothyroidism, unspecified  type - Plan: TSH  Hyperglycemia - Plan: Hemoglobin A1c  Assessment: -Risk factors: none -Surgery Risks:intermediate -age, nutritional status, fraility: good nutritional status, age >12, no fraility -functional capacity: > 6 METs wethout symptoms -comorbidities: none Patient Specific Risks: patient is low risk for intermediate risks surgery   Recommendations for optimizing general medical care prior to surgery: -meet with anesthesiology prior to surgery: hx thyroid disease and post op NV -advised patient to discuss specific risks morbidity and mortality of surgery with surgeon, CV risks discussed with patient -advised patient will defer to surgeon for post-op DVT prophylaxis and post op care -no specific medical recommendations for this patient at this time and no recommendations to defer surgery or for further CV testing prior to surgery -form for pre-op optimization of general medical care prior to surgery faxed to surgeon office along with letter with these recommendations. -labs today per orders -follow up for AWV in 1-2 months  -Patient advised to return or notify a doctor immediately if symptoms worsen or persist or new concerns arise.  Patient Instructions  BEFORE YOU LEAVE: -labs -follow up: AWV in September or October with Manuela Schwartz  We have ordered labs or studies at this visit. It can take up to 1-2 weeks for results and processing. IF results require follow up or explanation, we will call you with instructions. Clinically stable results will be released to your [facility]. If you have not heard from [place] or cannot find your results in [facility] in 2 weeks please contact our office at [phone].  If you are not yet signed up for [facility], please consider signing up.  We will fax letter to your surgeon regarding the preoperative visit today.           Nancy [name]

## 2018-01-30 ENCOUNTER — Ambulatory Visit (INDEPENDENT_AMBULATORY_CARE_PROVIDER_SITE_OTHER): Payer: Medicare Other | Admitting: Family Medicine

## 2018-01-30 ENCOUNTER — Encounter: Payer: Self-pay | Admitting: Family Medicine

## 2018-01-30 VITALS — BP 120/76 | HR 73 | Temp 97.9°F | Ht 65.25 in | Wt 157.1 lb

## 2018-01-30 DIAGNOSIS — N289 Disorder of kidney and ureter, unspecified: Secondary | ICD-10-CM

## 2018-01-30 DIAGNOSIS — R739 Hyperglycemia, unspecified: Secondary | ICD-10-CM

## 2018-01-30 DIAGNOSIS — E785 Hyperlipidemia, unspecified: Secondary | ICD-10-CM

## 2018-01-30 DIAGNOSIS — E039 Hypothyroidism, unspecified: Secondary | ICD-10-CM | POA: Diagnosis not present

## 2018-01-30 DIAGNOSIS — Z01818 Encounter for other preprocedural examination: Secondary | ICD-10-CM | POA: Diagnosis not present

## 2018-01-30 DIAGNOSIS — E875 Hyperkalemia: Secondary | ICD-10-CM

## 2018-01-30 DIAGNOSIS — Z1331 Encounter for screening for depression: Secondary | ICD-10-CM

## 2018-01-30 LAB — CBC
HCT: 33.9 % — ABNORMAL LOW (ref 36.0–46.0)
HEMOGLOBIN: 11.5 g/dL — AB (ref 12.0–15.0)
MCHC: 34 g/dL (ref 30.0–36.0)
MCV: 94 fl (ref 78.0–100.0)
Platelets: 217 10*3/uL (ref 150.0–400.0)
RBC: 3.61 Mil/uL — AB (ref 3.87–5.11)
RDW: 12.8 % (ref 11.5–15.5)
WBC: 6.1 10*3/uL (ref 4.0–10.5)

## 2018-01-30 LAB — BASIC METABOLIC PANEL
BUN: 33 mg/dL — ABNORMAL HIGH (ref 6–23)
CO2: 29 meq/L (ref 19–32)
CREATININE: 1.2 mg/dL (ref 0.40–1.20)
Calcium: 9.9 mg/dL (ref 8.4–10.5)
Chloride: 107 mEq/L (ref 96–112)
GFR: 45.91 mL/min — ABNORMAL LOW (ref >60.00)
Glucose, Bld: 97 mg/dL (ref 70–99)
POTASSIUM: 5.5 meq/L — AB (ref 3.5–5.1)
Sodium: 141 mEq/L (ref 135–145)

## 2018-01-30 LAB — LIPID PANEL
CHOL/HDL RATIO: 3
CHOLESTEROL: 190 mg/dL (ref 0–200)
HDL: 60.2 mg/dL (ref >39.00)
LDL CALC: 108 mg/dL — AB (ref 0–99)
NONHDL: 129.61
Triglycerides: 106 mg/dL (ref 0.0–149.0)
VLDL: 21.2 mg/dL (ref 0.0–40.0)

## 2018-01-30 LAB — HEMOGLOBIN A1C: HEMOGLOBIN A1C: 5.8 % (ref 4.6–6.5)

## 2018-01-30 LAB — TSH: TSH: 0.38 u[IU]/mL (ref 0.35–4.50)

## 2018-01-30 NOTE — Patient Instructions (Signed)
BEFORE YOU LEAVE: -labs -follow up: AWV in September or October with Manuela Schwartz  We have ordered labs or studies at this visit. It can take up to 1-2 weeks for results and processing. IF results require follow up or explanation, we will call you with instructions. Clinically stable results will be released to your [facility]. If you have not heard from [place] or cannot find your results in [facility] in 2 weeks please contact our office at [phone].  If you are not yet signed up for [facility], please consider signing up.  We will fax letter to your surgeon regarding the preoperative visit today.

## 2018-01-31 DIAGNOSIS — M25511 Pain in right shoulder: Secondary | ICD-10-CM | POA: Diagnosis not present

## 2018-02-03 NOTE — Addendum Note (Signed)
Addended by: Agnes Lawrence on: 02/03/2018 05:14 PM   Modules accepted: Orders

## 2018-03-03 ENCOUNTER — Other Ambulatory Visit (INDEPENDENT_AMBULATORY_CARE_PROVIDER_SITE_OTHER): Payer: Medicare Other

## 2018-03-03 DIAGNOSIS — Z23 Encounter for immunization: Secondary | ICD-10-CM | POA: Diagnosis not present

## 2018-03-03 DIAGNOSIS — N289 Disorder of kidney and ureter, unspecified: Secondary | ICD-10-CM

## 2018-03-03 DIAGNOSIS — E875 Hyperkalemia: Secondary | ICD-10-CM

## 2018-03-03 LAB — BASIC METABOLIC PANEL
BUN: 24 mg/dL — AB (ref 6–23)
CHLORIDE: 106 meq/L (ref 96–112)
CO2: 28 mEq/L (ref 19–32)
CREATININE: 1.02 mg/dL (ref 0.40–1.20)
Calcium: 9.8 mg/dL (ref 8.4–10.5)
GFR: 55.37 mL/min — AB (ref >60.00)
Glucose, Bld: 94 mg/dL (ref 70–99)
Potassium: 5.5 mEq/L — ABNORMAL HIGH (ref 3.5–5.1)
Sodium: 140 mEq/L (ref 135–145)

## 2018-03-10 ENCOUNTER — Encounter (HOSPITAL_COMMUNITY): Payer: Self-pay

## 2018-03-10 ENCOUNTER — Other Ambulatory Visit (HOSPITAL_COMMUNITY): Payer: Self-pay | Admitting: *Deleted

## 2018-03-10 NOTE — Progress Notes (Signed)
Medical clearance note dr Colin Benton 01-30-18 epic

## 2018-03-10 NOTE — Patient Instructions (Addendum)
Nancy [name]  03/10/2018   Your procedure is scheduled on: 03-18-18  Report to [facility] Main  Entrance  Report to admitting at  1030 AM    Call this number if you have problems the morning of surgery [phone]   Remember: Do not eat food :After Midnight, CLEAR LIQUIDS FROM MIDNIGHT UNTIL 700 AM DAY OF SURGERY, THEN NOTHING BY MOUTH AFTER 700 AM. BRUSH YOUR TEETH MORNING OF SURGERY AND RINSE YOUR MOUTH OUT, NO CHEWING GUM CANDY OR MINTS.     Take these medicines the morning of surgery with A SIP OF WATER: LEVOTHRYOXINE (SYNTHROID) DO NOT TAKE ANY DIABETIC MEDICATIONS DAY OF YOUR SURGERY                               You may not have any metal on your body including hair pins and              piercings  Do not wear jewelry, make-up, lotions, powders or perfumes, deodorant             Do not wear nail polish.  Do not shave  48 hours prior to surgery.                Do not bring valuables to the hospital. [place].  Contacts, dentures or bridgework may not be worn into surgery.  Leave suitcase in the car. After surgery it may be brought to your room.                  Please read over the following fact sheets you were given: _____________________________________________________________________             [facility] - Preparing for Surgery Before surgery, you can play an important role.  Because skin is not sterile, your skin needs to be as free of germs as possible.  You can reduce the number of germs on your skin by washing with CHG (chlorahexidine gluconate) soap before surgery.  CHG is an antiseptic cleaner which kills germs and bonds with the skin to continue killing germs even after washing. Please DO NOT use if you have an allergy to CHG or antibacterial soaps.  If your skin becomes reddened/irritated stop using the CHG and inform your nurse when you arrive at Short Stay. Do not shave (including  legs and underarms) for at least 48 hours prior to the first CHG shower.  You may shave your face/neck. Please follow these instructions carefully:  1.  Shower with CHG Soap the night before surgery and the  morning of Surgery.  2.  If you choose to wash your hair, wash your hair first as usual with your  normal  shampoo.  3.  After you shampoo, rinse your hair and body thoroughly to remove the  shampoo.                           4.  Use CHG as you would any other liquid soap.  You can apply chg directly  to the skin and wash                       Gently with a  scrungie or clean washcloth.  5.  Apply the CHG Soap to your body ONLY FROM THE NECK DOWN.   Do not use on face/ open                           Wound or open sores. Avoid contact with eyes, ears mouth and genitals (private parts).                       Wash face,  Genitals (private parts) with your normal soap.             6.  Wash thoroughly, paying special attention to the area where your surgery  will be performed.  7.  Thoroughly rinse your body with warm water from the neck down.  8.  DO NOT shower/wash with your normal soap after using and rinsing off  the CHG Soap.                9.  Pat yourself dry with a clean towel.            10.  Wear clean pajamas.            11.  Place clean sheets on your bed the night of your first shower and do not  sleep with pets. Day of Surgery : Do not apply any lotions/deodorants the morning of surgery.  Please wear clean clothes to the hospital/surgery center.  FAILURE TO FOLLOW THESE INSTRUCTIONS MAY RESULT IN THE CANCELLATION OF YOUR SURGERY PATIENT SIGNATURE_________________________________  NURSE SIGNATURE__________________________________  ________________________________________________________________________   Nancy [name]  An incentive spirometer is a tool that can help keep your lungs clear and active. This tool measures how well you are filling your lungs with each breath.  Taking long deep breaths may help reverse or decrease the chance of developing breathing (pulmonary) problems (especially infection) following:  A long period of time when you are unable to move or be active. BEFORE THE PROCEDURE   If the spirometer includes an indicator to show your best effort, your nurse or respiratory therapist will set it to a desired goal.  If possible, sit up straight or lean slightly forward. Try not to slouch.  Hold the incentive spirometer in an upright position. INSTRUCTIONS FOR USE  1. Sit on the edge of your bed if possible, or sit up as far as you can in bed or on a chair. 2. Hold the incentive spirometer in an upright position. 3. Breathe out normally. 4. Place the mouthpiece in your mouth and seal your lips tightly around it. 5. Breathe in slowly and as deeply as possible, raising the piston or the ball toward the top of the column. 6. Hold your breath for 3-5 seconds or for as long as possible. Allow the piston or ball to fall to the bottom of the column. 7. Remove the mouthpiece from your mouth and breathe out normally. 8. Rest for a few seconds and repeat Steps 1 through 7 at least 10 times every 1-2 hours when you are awake. Take your time and take a few normal breaths between deep breaths. 9. The spirometer may include an indicator to show your best effort. Use the indicator as a goal to work toward during each repetition. 10. After each set of 10 deep breaths, practice coughing to be sure your lungs are clear. If you have an incision (the cut made at the time of surgery), support your incision  when coughing by placing a pillow or rolled up towels firmly against it. Once you are able to get out of bed, walk around indoors and cough well. You may stop using the incentive spirometer when instructed by your caregiver.  RISKS AND COMPLICATIONS  Take your time so you do not get dizzy or light-headed.  If you are in pain, you may need to take or ask for pain  medication before doing incentive spirometry. It is harder to take a deep breath if you are having pain. AFTER USE  Rest and breathe slowly and easily.  It can be helpful to keep track of a log of your progress. Your caregiver can provide you with a simple table to help with this. If you are using the spirometer at home, follow these instructions: [place] IF:   You are having difficultly using the spirometer.  You have trouble using the spirometer as often as instructed.  Your pain medication is not giving enough relief while using the spirometer.  You develop fever of 100.5 F (38.1 C) or higher. SEEK IMMEDIATE MEDICAL CARE IF:   You cough up bloody sputum that had not been present before.  You develop fever of 102 F (38.9 C) or greater.  You develop worsening pain at or near the incision site. MAKE SURE YOU:   Understand these instructions.  Will watch your condition.  Will get help right away if you are not doing well or get worse. Document Released: 10/15/2006 Document Revised: 08/27/2011 Document Reviewed: 12/16/2006 [facility] Patient Information 2014 [place], [place].   ________________________________________________________________________

## 2018-03-13 ENCOUNTER — Encounter (HOSPITAL_COMMUNITY)
Admission: RE | Admit: 2018-03-13 | Discharge: 2018-03-13 | Disposition: A | Discharge status: Home / Self Care | Payer: Medicare Other | Source: Ambulatory Visit | Attending: Orthopedic Surgery | Admitting: Orthopedic Surgery

## 2018-03-13 ENCOUNTER — Other Ambulatory Visit: Payer: Self-pay

## 2018-03-13 ENCOUNTER — Encounter (HOSPITAL_COMMUNITY): Payer: Self-pay

## 2018-03-13 DIAGNOSIS — Z01818 Encounter for other preprocedural examination: Secondary | ICD-10-CM | POA: Insufficient documentation

## 2018-03-13 DIAGNOSIS — M87811 Other osteonecrosis, right shoulder: Secondary | ICD-10-CM | POA: Insufficient documentation

## 2018-03-13 HISTORY — DX: Other specified postprocedural states: Z98.890

## 2018-03-13 HISTORY — DX: Idiopathic aseptic necrosis of unspecified bone: M87.00

## 2018-03-13 HISTORY — DX: Other specified postprocedural states: R11.2

## 2018-03-13 LAB — SURGICAL PCR SCREEN
MRSA, PCR: NEGATIVE
STAPHYLOCOCCUS AUREUS: NEGATIVE

## 2018-03-13 LAB — CBC
HCT: 37.4 % (ref 36.0–46.0)
Hemoglobin: 12 g/dL (ref 12.0–15.0)
MCH: 30.9 pg (ref 26.0–34.0)
MCHC: 32.1 g/dL (ref 30.0–36.0)
MCV: 96.4 fL (ref 78.0–100.0)
Platelets: 290 10*3/uL (ref 150–400)
RBC: 3.88 MIL/uL (ref 3.87–5.11)
RDW: 13.3 % (ref 11.5–15.5)
WBC: 9.2 10*3/uL (ref 4.0–10.5)

## 2018-03-14 ENCOUNTER — Other Ambulatory Visit: Payer: Self-pay | Admitting: Orthopedic Surgery

## 2018-03-18 ENCOUNTER — Telehealth (HOSPITAL_COMMUNITY): Payer: Self-pay | Admitting: *Deleted

## 2018-03-18 ENCOUNTER — Inpatient Hospital Stay (HOSPITAL_COMMUNITY): Payer: Medicare Other | Admitting: Anesthesiology

## 2018-03-18 ENCOUNTER — Inpatient Hospital Stay (HOSPITAL_COMMUNITY)
Admission: RE | Admit: 2018-03-18 | Discharge: 2018-03-20 | DRG: 483 | Disposition: A | Discharge status: Home / Self Care | Payer: Medicare Other | Attending: Orthopedic Surgery | Admitting: Orthopedic Surgery

## 2018-03-18 ENCOUNTER — Other Ambulatory Visit: Payer: Self-pay

## 2018-03-18 ENCOUNTER — Encounter (HOSPITAL_COMMUNITY): Payer: Self-pay | Admitting: *Deleted

## 2018-03-18 ENCOUNTER — Encounter (HOSPITAL_COMMUNITY)
Admission: RE | Disposition: A | Discharge status: Home / Self Care | Payer: Self-pay | Source: Home / Self Care | Attending: Orthopedic Surgery

## 2018-03-18 ENCOUNTER — Inpatient Hospital Stay (HOSPITAL_COMMUNITY): Payer: Medicare Other

## 2018-03-18 DIAGNOSIS — Z79890 Hormone replacement therapy (postmenopausal): Secondary | ICD-10-CM

## 2018-03-18 DIAGNOSIS — Z885 Allergy status to narcotic agent status: Secondary | ICD-10-CM

## 2018-03-18 DIAGNOSIS — Z96611 Presence of right artificial shoulder joint: Secondary | ICD-10-CM

## 2018-03-18 DIAGNOSIS — Z471 Aftercare following joint replacement surgery: Secondary | ICD-10-CM | POA: Diagnosis not present

## 2018-03-18 DIAGNOSIS — Z90710 Acquired absence of both cervix and uterus: Secondary | ICD-10-CM | POA: Diagnosis not present

## 2018-03-18 DIAGNOSIS — E039 Hypothyroidism, unspecified: Secondary | ICD-10-CM | POA: Diagnosis present

## 2018-03-18 DIAGNOSIS — M879 Osteonecrosis, unspecified: Principal | ICD-10-CM | POA: Diagnosis present

## 2018-03-18 DIAGNOSIS — Z79899 Other long term (current) drug therapy: Secondary | ICD-10-CM | POA: Diagnosis not present

## 2018-03-18 DIAGNOSIS — E669 Obesity, unspecified: Secondary | ICD-10-CM | POA: Diagnosis present

## 2018-03-18 DIAGNOSIS — Z90722 Acquired absence of ovaries, bilateral: Secondary | ICD-10-CM

## 2018-03-18 DIAGNOSIS — Z882 Allergy status to sulfonamides status: Secondary | ICD-10-CM | POA: Diagnosis not present

## 2018-03-18 DIAGNOSIS — M810 Age-related osteoporosis without current pathological fracture: Secondary | ICD-10-CM | POA: Diagnosis present

## 2018-03-18 DIAGNOSIS — Z884 Allergy status to anesthetic agent status: Secondary | ICD-10-CM | POA: Diagnosis not present

## 2018-03-18 DIAGNOSIS — D62 Acute posthemorrhagic anemia: Secondary | ICD-10-CM | POA: Diagnosis not present

## 2018-03-18 DIAGNOSIS — S42201P Unspecified fracture of upper end of right humerus, subsequent encounter for fracture with malunion: Secondary | ICD-10-CM

## 2018-03-18 DIAGNOSIS — M19011 Primary osteoarthritis, right shoulder: Secondary | ICD-10-CM | POA: Diagnosis not present

## 2018-03-18 DIAGNOSIS — S42291P Other displaced fracture of upper end of right humerus, subsequent encounter for fracture with malunion: Secondary | ICD-10-CM

## 2018-03-18 DIAGNOSIS — G8918 Other acute postprocedural pain: Secondary | ICD-10-CM | POA: Diagnosis not present

## 2018-03-18 HISTORY — DX: Other displaced fracture of upper end of right humerus, subsequent encounter for fracture with malunion: S42.291P

## 2018-03-18 HISTORY — PX: REVERSE SHOULDER ARTHROPLASTY: SHX5054

## 2018-03-18 SURGERY — ARTHROPLASTY, SHOULDER, TOTAL, REVERSE
Anesthesia: General | Site: Shoulder | Laterality: Right

## 2018-03-18 MED ORDER — KETOROLAC TROMETHAMINE 15 MG/ML IJ SOLN
7.5000 mg | Freq: Four times a day (QID) | INTRAMUSCULAR | Status: AC
  Administered 2018-03-18 – 2018-03-19 (×3): 7.5 mg via INTRAVENOUS
  Filled 2018-03-18 (×3): qty 1

## 2018-03-18 MED ORDER — CHLORHEXIDINE GLUCONATE 4 % EX LIQD: 60.0000 mL | Freq: Once | CUTANEOUS | Status: DC

## 2018-03-18 MED ORDER — MORPHINE SULFATE (PF) 2 MG/ML IV SOLN: 0.5000 mg | INTRAVENOUS | Status: DC | PRN

## 2018-03-18 MED ORDER — CEFAZOLIN SODIUM-DEXTROSE 2-4 GM/100ML-% IV SOLN
2.0000 g | INTRAVENOUS | Status: AC
  Administered 2018-03-18: 2 g via INTRAVENOUS

## 2018-03-18 MED ORDER — BUPIVACAINE LIPOSOME 1.3 % IJ SUSP
INTRAMUSCULAR | Status: DC | PRN
  Administered 2018-03-18: 10 mL via PERINEURAL

## 2018-03-18 MED ORDER — ACETAMINOPHEN 325 MG PO TABS: 325.0000 mg | ORAL_TABLET | Freq: Four times a day (QID) | ORAL | Status: DC | PRN

## 2018-03-18 MED ORDER — METOCLOPRAMIDE HCL 5 MG PO TABS: 5.0000 mg | ORAL_TABLET | Freq: Three times a day (TID) | ORAL | Status: DC | PRN

## 2018-03-18 MED ORDER — DEXAMETHASONE SODIUM PHOSPHATE 10 MG/ML IJ SOLN
INTRAMUSCULAR | Status: DC | PRN
  Administered 2018-03-18: 5 mg via INTRAVENOUS

## 2018-03-18 MED ORDER — PROPOFOL 10 MG/ML IV BOLUS
INTRAVENOUS | Status: DC | PRN
  Administered 2018-03-18: 100 mg via INTRAVENOUS
  Administered 2018-03-18: 50 mg via INTRAVENOUS

## 2018-03-18 MED ORDER — POVIDONE-IODINE 10 % EX SWAB: 2.0000 "application " | Freq: Once | CUTANEOUS | Status: DC

## 2018-03-18 MED ORDER — MENTHOL 3 MG MT LOZG: 1.0000 | LOZENGE | OROMUCOSAL | Status: DC | PRN

## 2018-03-18 MED ORDER — STERILE WATER FOR IRRIGATION IR SOLN
Status: DC | PRN
  Administered 2018-03-18: 1000 mL

## 2018-03-18 MED ORDER — MAGNESIUM CITRATE PO SOLN: 1.0000 | Freq: Once | ORAL | Status: DC | PRN

## 2018-03-18 MED ORDER — POTASSIUM CHLORIDE IN NACL 20-0.45 MEQ/L-% IV SOLN
INTRAVENOUS | Status: DC
  Administered 2018-03-19: via INTRAVENOUS
  Filled 2018-03-18 (×2): qty 1000

## 2018-03-18 MED ORDER — ONDANSETRON HCL 4 MG/2ML IJ SOLN
4.0000 mg | Freq: Four times a day (QID) | INTRAMUSCULAR | Status: DC | PRN
  Administered 2018-03-18: 4 mg via INTRAVENOUS
  Filled 2018-03-18: qty 2

## 2018-03-18 MED ORDER — TRAMADOL HCL 50 MG PO TABS
50.0000 mg | ORAL_TABLET | Freq: Four times a day (QID) | ORAL | Status: DC | PRN
  Administered 2018-03-19 – 2018-03-20 (×3): 50 mg via ORAL
  Filled 2018-03-18 (×3): qty 1

## 2018-03-18 MED ORDER — METHOCARBAMOL 500 MG PO TABS
500.0000 mg | ORAL_TABLET | Freq: Four times a day (QID) | ORAL | Status: DC | PRN
  Administered 2018-03-19: 500 mg via ORAL
  Filled 2018-03-18: qty 1

## 2018-03-18 MED ORDER — ONDANSETRON HCL 4 MG/2ML IJ SOLN
INTRAMUSCULAR | Status: DC | PRN
  Administered 2018-03-18: 4 mg via INTRAVENOUS

## 2018-03-18 MED ORDER — BACLOFEN 10 MG PO TABS: 10.0000 mg | ORAL_TABLET | Freq: Three times a day (TID) | ORAL | 0 refills | Status: DC

## 2018-03-18 MED ORDER — SUGAMMADEX SODIUM 200 MG/2ML IV SOLN
INTRAVENOUS | Status: DC | PRN
  Administered 2018-03-18: 150 mg via INTRAVENOUS

## 2018-03-18 MED ORDER — PHENOL 1.4 % MT LIQD
1.0000 | OROMUCOSAL | Status: DC | PRN
  Filled 2018-03-18: qty 177

## 2018-03-18 MED ORDER — FENTANYL CITRATE (PF) 250 MCG/5ML IJ SOLN
INTRAMUSCULAR | Status: AC
  Filled 2018-03-18: qty 5

## 2018-03-18 MED ORDER — LIDOCAINE 2% (20 MG/ML) 5 ML SYRINGE
INTRAMUSCULAR | Status: DC | PRN
  Administered 2018-03-18: 40 mg via INTRAVENOUS

## 2018-03-18 MED ORDER — ALUM & MAG HYDROXIDE-SIMETH 200-200-20 MG/5ML PO SUSP: 30.0000 mL | ORAL | Status: DC | PRN

## 2018-03-18 MED ORDER — PROPOFOL 10 MG/ML IV BOLUS
INTRAVENOUS | Status: AC
  Filled 2018-03-18: qty 20

## 2018-03-18 MED ORDER — TRAMADOL HCL 50 MG PO TABS: 50.0000 mg | ORAL_TABLET | Freq: Four times a day (QID) | ORAL | 0 refills | Status: DC | PRN

## 2018-03-18 MED ORDER — FENTANYL CITRATE (PF) 100 MCG/2ML IJ SOLN
25.0000 ug | INTRAMUSCULAR | Status: DC | PRN
  Administered 2018-03-18 (×2): 50 ug via INTRAVENOUS

## 2018-03-18 MED ORDER — DIPHENHYDRAMINE HCL 12.5 MG/5ML PO ELIX: 12.5000 mg | ORAL_SOLUTION | ORAL | Status: DC | PRN

## 2018-03-18 MED ORDER — FENTANYL CITRATE (PF) 100 MCG/2ML IJ SOLN
50.0000 ug | INTRAMUSCULAR | Status: DC
  Administered 2018-03-18: 50 ug via INTRAVENOUS

## 2018-03-18 MED ORDER — ALBUMIN HUMAN 5 % IV SOLN
INTRAVENOUS | Status: DC | PRN
  Administered 2018-03-18: 16:00:00 via INTRAVENOUS

## 2018-03-18 MED ORDER — BISACODYL 10 MG RE SUPP: 10.0000 mg | Freq: Every day | RECTAL | Status: DC | PRN

## 2018-03-18 MED ORDER — DOCUSATE SODIUM 100 MG PO CAPS
100.0000 mg | ORAL_CAPSULE | Freq: Two times a day (BID) | ORAL | Status: DC
  Administered 2018-03-18 – 2018-03-20 (×4): 100 mg via ORAL
  Filled 2018-03-18 (×4): qty 1

## 2018-03-18 MED ORDER — CEFAZOLIN SODIUM-DEXTROSE 2-4 GM/100ML-% IV SOLN
INTRAVENOUS | Status: AC
  Filled 2018-03-18: qty 100

## 2018-03-18 MED ORDER — LACTATED RINGERS IV SOLN
INTRAVENOUS | Status: DC | PRN
  Administered 2018-03-18 (×3): via INTRAVENOUS

## 2018-03-18 MED ORDER — LEVOTHYROXINE SODIUM 75 MCG PO TABS
75.0000 ug | ORAL_TABLET | Freq: Every day | ORAL | Status: DC
  Administered 2018-03-19 – 2018-03-20 (×2): 75 ug via ORAL
  Filled 2018-03-18 (×2): qty 1

## 2018-03-18 MED ORDER — MELOXICAM 15 MG PO TABS
15.0000 mg | ORAL_TABLET | Freq: Every day | ORAL | Status: DC
  Administered 2018-03-20: 15 mg via ORAL
  Filled 2018-03-18: qty 1

## 2018-03-18 MED ORDER — FENTANYL CITRATE (PF) 100 MCG/2ML IJ SOLN
INTRAMUSCULAR | Status: DC | PRN
  Administered 2018-03-18 (×2): 50 ug via INTRAVENOUS

## 2018-03-18 MED ORDER — EPHEDRINE SULFATE-NACL 50-0.9 MG/10ML-% IV SOSY
PREFILLED_SYRINGE | INTRAVENOUS | Status: DC | PRN
  Administered 2018-03-18: 5 mg via INTRAVENOUS
  Administered 2018-03-18 (×4): 10 mg via INTRAVENOUS

## 2018-03-18 MED ORDER — ZOLPIDEM TARTRATE 5 MG PO TABS
5.0000 mg | ORAL_TABLET | Freq: Every evening | ORAL | Status: DC | PRN
  Administered 2018-03-19: 5 mg via ORAL
  Filled 2018-03-18: qty 1

## 2018-03-18 MED ORDER — FENTANYL CITRATE (PF) 100 MCG/2ML IJ SOLN
INTRAMUSCULAR | Status: AC
  Administered 2018-03-18: 50 ug via INTRAVENOUS
  Filled 2018-03-18: qty 2

## 2018-03-18 MED ORDER — BUPIVACAINE HCL (PF) 0.5 % IJ SOLN
INTRAMUSCULAR | Status: DC | PRN
  Administered 2018-03-18: 10 mL via PERINEURAL

## 2018-03-18 MED ORDER — 0.9 % SODIUM CHLORIDE (POUR BTL) OPTIME
TOPICAL | Status: DC | PRN
  Administered 2018-03-18: 1000 mL

## 2018-03-18 MED ORDER — ROCURONIUM BROMIDE 10 MG/ML (PF) SYRINGE
PREFILLED_SYRINGE | INTRAVENOUS | Status: DC | PRN
  Administered 2018-03-18 (×3): 10 mg via INTRAVENOUS
  Administered 2018-03-18: 40 mg via INTRAVENOUS

## 2018-03-18 MED ORDER — FENTANYL CITRATE (PF) 100 MCG/2ML IJ SOLN
INTRAMUSCULAR | Status: AC
  Filled 2018-03-18: qty 4

## 2018-03-18 MED ORDER — SODIUM CHLORIDE 0.9 % IV SOLN
INTRAVENOUS | Status: DC | PRN
  Administered 2018-03-18: 30 ug/min via INTRAVENOUS

## 2018-03-18 MED ORDER — ONDANSETRON HCL 4 MG PO TABS: 4.0000 mg | ORAL_TABLET | Freq: Four times a day (QID) | ORAL | Status: DC | PRN

## 2018-03-18 MED ORDER — METOCLOPRAMIDE HCL 5 MG/ML IJ SOLN: 5.0000 mg | Freq: Three times a day (TID) | INTRAMUSCULAR | Status: DC | PRN

## 2018-03-18 MED ORDER — POLYETHYLENE GLYCOL 3350 17 G PO PACK: 17.0000 g | PACK | Freq: Every day | ORAL | Status: DC | PRN

## 2018-03-18 MED ORDER — METHOCARBAMOL 500 MG IVPB - SIMPLE MED
INTRAVENOUS | Status: AC
  Filled 2018-03-18: qty 50

## 2018-03-18 MED ORDER — CEFAZOLIN SODIUM-DEXTROSE 1-4 GM/50ML-% IV SOLN
1.0000 g | Freq: Four times a day (QID) | INTRAVENOUS | Status: AC
  Administered 2018-03-18 – 2018-03-19 (×3): 1 g via INTRAVENOUS
  Filled 2018-03-18 (×3): qty 50

## 2018-03-18 MED ORDER — METHOCARBAMOL 500 MG IVPB - SIMPLE MED
500.0000 mg | Freq: Four times a day (QID) | INTRAVENOUS | Status: DC | PRN
  Administered 2018-03-18: 500 mg via INTRAVENOUS
  Filled 2018-03-18: qty 50

## 2018-03-18 MED ORDER — ACETAMINOPHEN 500 MG PO TABS
500.0000 mg | ORAL_TABLET | Freq: Four times a day (QID) | ORAL | Status: AC
  Administered 2018-03-19 (×2): 500 mg via ORAL
  Filled 2018-03-18 (×2): qty 1

## 2018-03-18 MED ORDER — CEFAZOLIN SODIUM-DEXTROSE 2-4 GM/100ML-% IV SOLN: 2.0000 g | INTRAVENOUS | Status: DC

## 2018-03-18 SURGICAL SUPPLY — 63 items
BAG SPEC THK2 15X12 ZIP CLS (MISCELLANEOUS) ×1
BAG ZIPLOCK 12X15 (MISCELLANEOUS) ×3 IMPLANT
BASEPLATE GLENOSPHERE 25 (Plate) ×1 IMPLANT
BASEPLATE GLENOSPHERE 25MM (Plate) ×1 IMPLANT
BEARING HUMERAL SHLDER 36M STD (Shoulder) IMPLANT
BIT DRILL TWIST 2.7 (BIT) ×1 IMPLANT
BIT DRILL TWIST 2.7MM (BIT) ×1
BLADE SAW SAG 73X25 THK (BLADE) ×2
BLADE SAW SGTL 73X25 THK (BLADE) ×1 IMPLANT
BOOTIES KNEE HIGH SLOAN (MISCELLANEOUS) ×2 IMPLANT
BOWL SMART MIX CTS (DISPOSABLE) IMPLANT
BRNG HUM STD 36 RVRS SHLDR (Shoulder) ×1 IMPLANT
CLOSURE STERI-STRIP 1/2X4 (GAUZE/BANDAGES/DRESSINGS) ×1
CLOSURE WOUND 1/2 X4 (GAUZE/BANDAGES/DRESSINGS) ×1
CLSR STERI-STRIP ANTIMIC 1/2X4 (GAUZE/BANDAGES/DRESSINGS) ×2 IMPLANT
COVER BACK TABLE 60X90IN (DRAPES) ×3 IMPLANT
COVER MAYO STAND STRL (DRAPES) ×3 IMPLANT
COVER SURGICAL LIGHT HANDLE (MISCELLANEOUS) ×3 IMPLANT
DECANTER SPIKE VIAL GLASS SM (MISCELLANEOUS) IMPLANT
DRAPE LG THREE QUARTER DISP (DRAPES) ×3 IMPLANT
DRAPE SHEET LG 3/4 BI-LAMINATE (DRAPES) ×1 IMPLANT
DRAPE SURG 17X11 SM STRL (DRAPES) ×5 IMPLANT
DRAPE U-SHAPE 47X51 STRL (DRAPES) ×3 IMPLANT
DRSG MEPILEX BORDER 4X8 (GAUZE/BANDAGES/DRESSINGS) ×3 IMPLANT
DURAPREP 26ML APPLICATOR (WOUND CARE) ×3 IMPLANT
ELECT REM PT RETURN 15FT ADLT (MISCELLANEOUS) ×3 IMPLANT
GLENOID SPHERE STD STRL 36MM (Orthopedic Implant) ×2 IMPLANT
GLOVE BIOGEL PI IND STRL 8 (GLOVE) ×2 IMPLANT
GLOVE BIOGEL PI INDICATOR 8 (GLOVE) ×4
GLOVE ORTHO TXT STRL SZ7.5 (GLOVE) ×3 IMPLANT
GLOVE SURG ORTHO 8.0 STRL STRW (GLOVE) ×3 IMPLANT
GOWN STRL REUS W/ TWL XL LVL3 (GOWN DISPOSABLE) ×1 IMPLANT
GOWN STRL REUS W/TWL 2XL LVL3 (GOWN DISPOSABLE) ×3 IMPLANT
GOWN STRL REUS W/TWL XL LVL3 (GOWN DISPOSABLE) ×3
HOOD PEEL AWAY FLYTE STAYCOOL (MISCELLANEOUS) ×8 IMPLANT
KIT BASIN OR (CUSTOM PROCEDURE TRAY) ×3 IMPLANT
PACK SHOULDER (CUSTOM PROCEDURE TRAY) ×3 IMPLANT
PIN THREADED REVERSE (PIN) ×2 IMPLANT
POSITIONER SURGICAL ARM (MISCELLANEOUS) ×1 IMPLANT
SCREW BONE CORT 6.5X35MM (Screw) ×2 IMPLANT
SCREW BONE LOCKING 4.75X35X3.5 (Screw) ×2 IMPLANT
SCREW LOCKING 4.75MMX15MM (Screw) ×4 IMPLANT
SCREW LOCKING NS 4.75MMX20MM (Screw) ×2 IMPLANT
SHOULDER HUMERAL BEAR 36M STD (Shoulder) ×3 IMPLANT
SLING ARM IMMOBILIZER LRG (SOFTGOODS) ×3 IMPLANT
SLING ARM IMMOBILIZER MED (SOFTGOODS) ×2 IMPLANT
SPONGE LAP 18X18 RF (DISPOSABLE) ×2 IMPLANT
STEM HUMERAL STRL 11MMX55MM (Stem) ×2 IMPLANT
STRIP CLOSURE SKIN 1/2X4 (GAUZE/BANDAGES/DRESSINGS) ×1 IMPLANT
SUCTION FRAZIER HANDLE 12FR (TUBING) ×2
SUCTION TUBE FRAZIER 12FR DISP (TUBING) ×1 IMPLANT
SUPPORT WRAP ARM LG (MISCELLANEOUS) ×3 IMPLANT
SUT FIBERWIRE #2 38 T-5 BLUE (SUTURE) ×6
SUT VIC AB 1 CT1 27 (SUTURE) ×3
SUT VIC AB 1 CT1 27XBRD ANTBC (SUTURE) ×1 IMPLANT
SUT VIC AB 2-0 CT1 27 (SUTURE) ×3
SUT VIC AB 2-0 CT1 TAPERPNT 27 (SUTURE) ×1 IMPLANT
SUT VIC AB 3-0 SH 8-18 (SUTURE) ×3 IMPLANT
SUTURE FIBERWR #2 38 T-5 BLUE (SUTURE) ×4 IMPLANT
TOWEL OR 17X26 10 PK STRL BLUE (TOWEL DISPOSABLE) ×3 IMPLANT
TOWEL OR NON WOVEN STRL DISP B (DISPOSABLE) ×3 IMPLANT
TOWER CARTRIDGE SMART MIX (DISPOSABLE) IMPLANT
TRAY HUM REV SHOULDER STD +6 (Shoulder) ×2 IMPLANT

## 2018-03-18 NOTE — Anesthesia Procedure Notes (Signed)
Anesthesia Regional Block: Interscalene brachial plexus block   Pre-Anesthetic Checklist: ,, timeout performed, Correct Patient, Correct Site, Correct Laterality, Correct Procedure, Correct Position, site marked, Risks and benefits discussed,  Surgical consent,  Pre-op evaluation,  At surgeon's request and post-op pain management  Laterality: Right and Upper  Prep: chloraprep       Needles:  Injection technique: Single-shot  Needle Type: Echogenic Stimulator Needle     Needle Length: 9cm  Needle Gauge: 21     Additional Needles:   Procedures:, nerve stimulator,,, ultrasound used (permanent image in chart),,,,   Nerve Stimulator or Paresthesia:  Response: forearm twitch, 0.4 mA, 0.1 ms,   Additional Responses:   Narrative:  Start time: 03/18/2018 1:01 PM End time: 03/18/2018 1:17 PM Injection made incrementally with aspirations every 5 mL.  Performed by: Personally  Anesthesiologist: Annye Asa, MD  Additional Notes: Pt identified in Holding room.  Monitors applied. Working IV access confirmed. Sterile prep R neck.  #21ga ECHOgenic PNS to forearm twitch at 0.76mA threshold.  10cc 0.5% Bupivacaine + 10cc Exparel injected incrementally after negative test dose.  Patient asymptomatic, VSS, no heme aspirated, tolerated well.  Jenita Seashore, MD

## 2018-03-18 NOTE — Discharge Instructions (Signed)
Diet: As you were doing prior to hospitalization   Shower:  May shower but keep the wounds dry, use an occlusive plastic wrap, NO SOAKING IN TUB.  If the bandage gets wet, change with a clean dry gauze.  If you have a splint on, leave the splint in place and keep the splint dry with a plastic bag.  Dressing:  You may change your dressing 3-5 days after surgery, unless you have a splint.  If you have a splint, then just leave the splint in place and we will change your bandages during your first follow-up appointment.    If you had hand or foot surgery, we will plan to remove your stitches in about 2 weeks in the office.  For all other surgeries, there are sticky tapes (steri-strips) on your wounds and all the stitches are absorbable.  Leave the steri-strips in place when changing your dressings, they will peel off with time, usually 2-3 weeks.  Activity:  Increase activity slowly as tolerated, but follow the weight bearing instructions below.  The rules on driving is that you can not be taking narcotics while you drive, and you must feel in control of the vehicle.    Weight Bearing:   Use sling at all times, ok for hand wrist and elbow motion.  Keep the elbow at your side.    To prevent constipation: you may use a stool softener such as -  Colace (over the counter) 100 mg by mouth twice a day  Drink plenty of fluids (prune juice may be helpful) and high fiber foods Miralax (over the counter) for constipation as needed.    Itching:  If you experience itching with your medications, try taking only a single pain pill, or even half a pain pill at a time.  You may take up to 10 pain pills per day, and you can also use benadryl over the counter for itching or also to help with sleep.   Precautions:  If you experience chest pain or shortness of breath - call 911 immediately for transfer to the hospital emergency department!!  If you develop a fever greater that 101 F, purulent drainage from wound,  increased redness or drainage from wound, or calf pain -- Call the office at [phone]                                                Follow- Up Appointment:  Please call for an appointment to be seen in 2 weeks [place] - [phone]

## 2018-03-18 NOTE — Transfer of Care (Signed)
Immediate Anesthesia Transfer of Care Note  Patient: Nancy [name]  Procedure(s) Performed: REVERSE RIGHT SHOULDER ARTHROPLASTY (Right Shoulder)  Patient Location: PACU  Anesthesia Type:General  Level of Consciousness: sedated, patient cooperative and responds to stimulation  Airway & Oxygen Therapy: Patient Spontanous Breathing and Patient connected to face mask oxygen  Post-op Assessment: Report given to RN and Post -op Vital signs reviewed and stable  Post vital signs: Reviewed and stable  Last Vitals:  Vitals Value Taken Time  BP 121/56 03/18/2018  4:54 PM  Temp    Pulse 85 03/18/2018  4:55 PM  Resp 20 03/18/2018  4:55 PM  SpO2 100 % 03/18/2018  4:55 PM  Vitals shown include unvalidated device data.  Last Pain:  Vitals:   03/18/18 1151  TempSrc:   PainSc: 3       Patients Stated Pain Goal: 4 (21/11/55 2080)  Complications: No apparent anesthesia complications

## 2018-03-18 NOTE — Progress Notes (Signed)
AssistedDr. Annye Asa with right, interscalene  block. Side rails up, monitors on throughout procedure. See vital signs in flow sheet. Tolerated Procedure well.

## 2018-03-18 NOTE — Anesthesia Procedure Notes (Signed)
Procedure Name: Intubation Performed by: Gean Maidens, CRNA Pre-anesthesia Checklist: Patient identified, Emergency Drugs available, Suction available, Patient being monitored and Timeout performed Patient Re-evaluated:Patient Re-evaluated prior to induction Oxygen Delivery Method: Circle system utilized Preoxygenation: Pre-oxygenation with 100% oxygen Induction Type: IV induction Ventilation: Mask ventilation without difficulty Laryngoscope Size: Mac and 3 Grade View: Grade II Tube type: Oral Tube size: 7.0 mm Number of attempts: 1 Airway Equipment and Method: Stylet Placement Confirmation: ETT inserted through vocal cords under direct vision,  CO2 detector and breath sounds checked- equal and bilateral Secured at: 21 cm Tube secured with: Tape Dental Injury: Teeth and Oropharynx as per pre-operative assessment

## 2018-03-18 NOTE — H&P (Signed)
PREOPERATIVE H&P  Chief Complaint: right shoulder pain  HPI: Nancy [name] is a 82 y.o. female who presents for preoperative history and physical with a diagnosis of right shoulder AVN with malunion from previous proximal humerus fracture. Symptoms are rated as moderate to severe, and have been worsening.  This is significantly impairing activities of daily living.  She has elected for surgical management.   Past Medical History:  Diagnosis Date  . Allergy   . Avascular necrosis (HCC)    RIGHT SHOULDER  . Chronic cough    saw several specialists per her report, NONE NOW  . Hypothyroidism   . Osteoporosis    Sees Dr. Layne Benton at [place], on [place]  . PONV (postoperative nausea and vomiting)    Past Surgical History:  Procedure Laterality Date  . ABDOMINAL HYSTERECTOMY  1975   PARTIAL  . APPENDECTOMY    . LAPAROSCOPY     removal of ovaries  . OOPHORECTOMY Bilateral 2007  . removal growth thyroid  1998  . TONSILLECTOMY     Social History   Socioeconomic History  . Marital status: Widowed    Spouse name: Not on file  . Number of children: Not on file  . Years of education: Not on file  . Highest education level: Not on file  Occupational History  . Not on file  Social Needs  . Financial resource strain: Not on file  . Food insecurity:    Worry: Not on file    Inability: Not on file  . Transportation needs:    Medical: Not on file    Non-medical: Not on file  Tobacco Use  . Smoking status: Never Smoker  . Smokeless tobacco: Never Used  Substance and Sexual Activity  . Alcohol use: Never    Frequency: Never  . Drug use: Never  . Sexual activity: Yes  Lifestyle  . Physical activity:    Days per week: Not on file    Minutes per session: Not on file  . Stress: Not on file  Relationships  . Social connections:    Talks on phone: Not on file    Gets together: Not on file    Attends religious service: Not on file    Active member of club or  organization: Not on file    Attends meetings of clubs or organizations: Not on file    Relationship status: Not on file  Other Topics Concern  . 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 Dementia Mother   . Colon cancer Mother 41  . Parkinsonism Father   . Colon cancer Sister 78  . Osteoporosis Unknown   . Stomach cancer Neg Hx    Allergies  Allergen Reactions  . Norco [Hydrocodone-Acetaminophen] Hives  . Penicillins Hives    Has patient had a PCN reaction causing immediate rash, facial/tongue/throat swelling, SOB or lightheadedness with hypotension: yes Has patient had a PCN reaction causing severe rash involving mucus membranes or skin necrosis: no Has patient had a PCN reaction that required hospitalization: no Has patient had a PCN reaction occurring within the last 10 years: yes If all of the above answers are "NO", then may proceed with Cephalosporin  use.   . Sulfamethoxazole Hives   Prior to Admission medications   Medication Sig Start Date End Date Taking? Authorizing Provider  acetaminophen (TYLENOL) 500 MG tablet Take 1,000 mg by mouth every 8 (eight) hours as needed (pain).   Yes [provider]  levothyroxine (SYNTHROID) 75 MCG tablet TAKE 1 TABLET BY MOUTH  DAILY BEFORE BREAKFAST Patient taking differently: Take 75 mcg by mouth daily before breakfast. TAKE 1 TABLET BY MOUTH  DAILY BEFORE BREAKFAST 07/09/17  Yes Lucretia Kern, DO  meloxicam (MOBIC) 15 MG tablet Take 15 mg by mouth daily.  01/22/18  Yes [provider]  Menthol, Topical Analgesic, (GNP COLD/HOT PAIN RELIEF PATCH EX) Apply 1 patch topically daily.   Yes [provider]     Positive ROS: All other systems have been reviewed and were otherwise negative with the  exception of those mentioned in the HPI and as above.  Physical Exam: General: Alert, no acute distress Cardiovascular: No pedal edema Respiratory: No cyanosis, no use of accessory musculature GI: No organomegaly, abdomen is soft and non-tender Skin: No lesions in the area of chief complaint Neurologic: Sensation intact distally Psychiatric: Patient is competent for consent with normal mood and affect Lymphatic: No axillary or cervical lymphadenopathy  MUSCULOSKELETAL: right shoulder AROM 0-30, weak cuff, positive crepitance  Assessment: Right shoulder AVN with tuberosity fracture.   Plan: Plan for Procedure(s): REVERSE RIGHT SHOULDER ARTHROPLASTY  The risks benefits and alternatives were discussed with the patient including but not limited to the risks of nonoperative treatment, versus surgical intervention including infection, bleeding, nerve injury,  blood clots, cardiopulmonary complications, morbidity, mortality, among others, and they were willing to proceed.   Anticipated LOS equal to or greater than 2 midnights due to - Age 63 and older with one or more of the following:  - Obesity  - Expected need for hospital services (PT, OT, Nursing) required for safe  discharge  - Anticipated need for postoperative skilled nursing care or inpatient rehab  - Active co-morbidities: None  Inpatient only procedure.     Johnny Bridge, MD Cell [phone]   03/18/2018 12:50 PM

## 2018-03-18 NOTE — Op Note (Signed)
03/18/2018  4:41 PM  PATIENT:  Nancy [name]    PRE-OPERATIVE DIAGNOSIS:  Closed 4-part fracture of proximal end of right humerus, with malunion, subsequent encounter with AVN  POST-OPERATIVE DIAGNOSIS:  Same  PROCEDURE:  RIGHT Reverse Total Shoulder Arthroplasty  SURGEON:  Johnny Bridge, MD  PHYSICIAN ASSISTANT: Joya Gaskins, OPA-C, present and scrubbed throughout the case, critical for completion in a timely fashion, and for retraction, instrumentation, and closure.  ANESTHESIA:   General with exparel regional block  ESTIMATED BLOOD LOSS: 672ml  UNIQUE ASPECTS OF THE CASE: The proximal humerus was grossly deformed.  I was able to find the biceps.  The humeral head had collapsed.  There was severe retroversion.  There was severe tuberosity malunion.  It was very difficult to dislocate the humeral head because it was directed so far posteriorly.  The proximal bone was fairly sclerotic.  The glenoid had a very abnormal covering, as it seemed like there was chondral tissue that had not had contact with any humeral head in a long time.  Even after making the humeral head cut, dislocating the proximal humerus around the glenosphere was extremely difficult, I almost took the glenosphere off in order to deliver out, this was because there was a significant tuberosity posteriorly that was malunited and difficult to move the humerus beyond the glenosphere.  PREOPERATIVE INDICATIONS:  Nancy [name] is a  82 y.o. female with a diagnosis of right proximal humerus malunion with AVN who failed conservative measures and elected for surgical management.  She had extremely poor active motion before the operation, 0 to 30 degrees at most, with severe constant pain.  The risks benefits and alternatives were discussed with the patient preoperatively including but not limited to the risks of infection, bleeding, nerve injury, cardiopulmonary complications, the need for revision surgery, dislocation,  brachial plexus palsy, incomplete relief of pain, among others, and the patient was willing to proceed.  OPERATIVE IMPLANTS: Biomet size 11 humeral stem press-fit micro with a 40 + 6 mm reverse shoulder arthroplasty tray with a 36 liner and a 36 mm glenosphere with a mini baseplate and 4 locking screws and one central nonlocking screw.  OPERATIVE FINDINGS: Chondral delamination of the humeral head with fragmentation.  Sclerosis of the proximal humerus.  Severe posterior retroversion and malunion of the tuberosities.  Dysfunctional scarred sclerotic rotator cuff.  OPERATIVE PROCEDURE: The patient was brought to the operating room and placed in the supine position. General anesthesia was administered. IV antibiotics were given.  Time out was performed. The upper extremity was prepped and draped in usual sterile fashion. The patient was in a beachchair position. Deltopectoral approach was carried out. The biceps was tenodesed to the pectoralis tendon with #2 Fiberwire.  It was a little tricky to find the biceps, but I was able to identify it.  It was much more medial than would normally be expected, given the valgus configuration of the proximal humerus.  The subscapularis was released off of the bone.   I then performed circumferential releases of the humerus, and then dislocated the head with a fair amount of difficulty, and then reamed with the reamer to the above named size.  I was able to find the canal, it was much more anterior and medial than would be typical.  I then applied the jig, and cut the humeral head in 30 of retroversion, and then turned my attention to the glenoid.  I actually had a very good cut, with a flat surface,  despite the collapse in the humeral head.  It match the head and the back.  Deep retractors were placed, and I resected the labrum, and then placed a guidepin into the center position on the glenoid, with slight inferior inclination. I then reamed over the guidepin, and  this created a small metaphyseal cancellus blush inferiorly, removing just the cartilage to the subchondral bone superiorly. The base plate was selected and impacted place, and then I secured it centrally with a nonlocking screw, and I had excellent purchase both inferiorly and superiorly. I placed a short locking screws on anterior and posterior aspects.  I then turned my attention to the glenosphere, and impacted this into place, placing slight inferior offset (set on B).    The glenoid sphere was completely seated, and had engagement of the [facility] taper. I then turned my attention back to the humerus.  At this point in the case, it became very difficult because I had not remove the tuberosity posteriorly, and I could not bring the humerus out.  I ended up having to make a cut in situ, and then ultimately was able to deliver the humerus out and then work on the tuberosity.  I removed the tuberosity with a rongure, as well as a osteotome.  I sequentially broached, initially I was able to get a 12 down, but the metaphyseal fill near the cortex is too tight, and 11 was extremely tight and stable and this was selected.  I was also concerned with trialing, because I did not want to get the proximal humerus reengaged on the glenosphere and unable to remove it.  Therefore I simply went with the real prosthesis.  The 11 was placed in the appropriate version.  The jig was utilized for this purpose.   It was found to restore soft tissue tension, and it had 2 finger tightness.  I used the baseplate that allowed the offset, and placed the offset posteriorly and superiorly in order to achieve appropriate bony coverage.  The shoulder felt stable throughout functional motion.  Before I placed the real prosthesis I had also placed a total of 3 #2 FiberWire through drill holes in the humerus for later subscapularis repair.  The shoulder had excellent motion, and was stable, and I irrigated the wounds copiously.    I  then used these to repair the subscapularis. This came down to bone.  I then irrigated the shoulder copiously once more, repaired the deltopectoral interval with Vicryl followed by subcutaneous Vicryl with Steri-Strips and sterile gauze for the skin. The patient was awakened and returned back in stable and satisfactory condition. There no complications and She tolerated the procedure well.

## 2018-03-18 NOTE — Anesthesia Preprocedure Evaluation (Addendum)
Anesthesia Evaluation  Patient identified by MRN, date of birth, ID band Patient awake    Reviewed: Allergy & Precautions, NPO status , Patient's Chart, lab work & pertinent test results  History of Anesthesia Complications (+) PONV  Airway Mallampati: I  TM Distance: >3 FB Neck ROM: Full    Dental  (+) Dental Advisory Given   Pulmonary neg pulmonary ROS,    breath sounds clear to auscultation       Cardiovascular negative cardio ROS   Rhythm:Regular Rate:Normal     Neuro/Psych negative neurological ROS     GI/Hepatic negative GI ROS, Neg liver ROS,   Endo/Other  Hypothyroidism   Renal/GU negative Renal ROS     Musculoskeletal  (+) Arthritis ,   Abdominal   Peds  Hematology negative hematology ROS (+)   Anesthesia Other Findings   Reproductive/Obstetrics                            Anesthesia Physical Anesthesia Plan  ASA: II  Anesthesia Plan: General   Post-op Pain Management: GA combined w/ Regional for post-op pain   Induction: Intravenous  PONV Risk Score and Plan: 4 or greater and Ondansetron, Dexamethasone and Treatment may vary due to age or medical condition  Airway Management Planned: Oral ETT  Additional Equipment:   Intra-op Plan:   Post-operative Plan: Extubation in OR  Informed Consent: I have reviewed the patients History and Physical, chart, labs and discussed the procedure including the risks, benefits and alternatives for the proposed anesthesia with the patient or authorized representative who has indicated his/her understanding and acceptance.   Dental advisory given  Plan Discussed with: CRNA and Surgeon  Anesthesia Plan Comments: (Plan routine monitors, GETA with interscalene block for post op analgesia)        Anesthesia Quick Evaluation

## 2018-03-18 NOTE — Anesthesia Postprocedure Evaluation (Signed)
Anesthesia Post Note  Patient: Nancy [name]  Procedure(s) Performed: REVERSE RIGHT SHOULDER ARTHROPLASTY (Right Shoulder)     Patient location during evaluation: PACU Anesthesia Type: General and Regional Level of consciousness: awake and alert, oriented and patient cooperative Pain management: pain level controlled Vital Signs Assessment: post-procedure vital signs reviewed and stable Respiratory status: spontaneous breathing, nonlabored ventilation, respiratory function stable and patient connected to nasal cannula oxygen Cardiovascular status: blood pressure returned to baseline and stable Postop Assessment: no apparent nausea or vomiting Anesthetic complications: no    Last Vitals:  Vitals:   03/18/18 1700 03/18/18 1715  BP: (!) 110/52 (!) 102/51  Pulse: 84 82  Resp: 15 14  Temp:  (!) 36.3 C  SpO2: 100% 100%    Last Pain:  Vitals:   03/18/18 1720  TempSrc:   PainSc: Asleep                 Slate Debroux,E. Lilianne Delair

## 2018-03-19 ENCOUNTER — Encounter (HOSPITAL_COMMUNITY): Payer: Self-pay

## 2018-03-19 DIAGNOSIS — D62 Acute posthemorrhagic anemia: Secondary | ICD-10-CM

## 2018-03-19 HISTORY — DX: Acute posthemorrhagic anemia: D62

## 2018-03-19 LAB — BASIC METABOLIC PANEL
ANION GAP: 8 (ref 5–15)
BUN: 26 mg/dL — ABNORMAL HIGH (ref 8–23)
CHLORIDE: 109 mmol/L (ref 98–111)
CO2: 26 mmol/L (ref 22–32)
Calcium: 8.3 mg/dL — ABNORMAL LOW (ref 8.9–10.3)
Creatinine, Ser: 1.03 mg/dL — ABNORMAL HIGH (ref 0.44–1.00)
GFR calc Af Amer: 58 mL/min — ABNORMAL LOW (ref >60)
GFR calc non Af Amer: 50 mL/min — ABNORMAL LOW (ref >60)
GLUCOSE: 157 mg/dL — AB (ref 70–99)
POTASSIUM: 4.8 mmol/L (ref 3.5–5.1)
Sodium: 143 mmol/L (ref 135–145)

## 2018-03-19 LAB — CBC
HCT: 24.6 % — ABNORMAL LOW (ref 36.0–46.0)
HEMOGLOBIN: 8.1 g/dL — AB (ref 12.0–15.0)
MCH: 31.4 pg (ref 26.0–34.0)
MCHC: 32.9 g/dL (ref 30.0–36.0)
MCV: 95.3 fL (ref 78.0–100.0)
PLATELETS: 178 10*3/uL (ref 150–400)
RBC: 2.58 MIL/uL — AB (ref 3.87–5.11)
RDW: 13.3 % (ref 11.5–15.5)
WBC: 9.2 10*3/uL (ref 4.0–10.5)

## 2018-03-19 LAB — PREPARE RBC (CROSSMATCH)

## 2018-03-19 LAB — ABO/RH: ABO/RH(D): A POS

## 2018-03-19 MED ORDER — SODIUM CHLORIDE 0.9% IV SOLUTION
Freq: Once | INTRAVENOUS | Status: AC
  Administered 2018-03-19: 13:00:00 via INTRAVENOUS

## 2018-03-19 MED ORDER — TRAZODONE HCL 50 MG PO TABS: 25.0000 mg | ORAL_TABLET | Freq: Every day | ORAL | 0 refills | Status: DC

## 2018-03-19 MED ORDER — ACETAMINOPHEN 325 MG PO TABS
650.0000 mg | ORAL_TABLET | Freq: Once | ORAL | Status: AC
  Administered 2018-03-19: 650 mg via ORAL
  Filled 2018-03-19: qty 2

## 2018-03-19 MED ORDER — DIPHENHYDRAMINE HCL 25 MG PO CAPS
25.0000 mg | ORAL_CAPSULE | Freq: Once | ORAL | Status: AC
  Administered 2018-03-19: 25 mg via ORAL
  Filled 2018-03-19: qty 1

## 2018-03-19 NOTE — Evaluation (Signed)
Occupational Therapy Evaluation Patient Details Name: Nancy [name] MRN: [id number] DOB: 1935-12-17 Today's Date: 03/19/2018    History of Present Illness Pt s/p reverse shoulder .  Closed 4-part fracture of proximal end of right humerus, with malunion, subsequent encounter with AVN   Clinical Impression   Pt admitted for shoulder surgery. Pt currently with functional limitations due to the deficits listed below (see OT Problem List).  Pt will benefit from skilled OT to increase their safety and independence with ADL and functional mobility for ADL to facilitate discharge to venue listed below.      Follow Up Recommendations  Follow surgeon's recommendation for DC plan and follow-up therapies    Equipment Recommendations       Recommendations for Other Services       Precautions / Restrictions Precautions Precautions: Shoulder Type of Shoulder Precautions: NWB Shoulder Interventions: Shoulder sling/immobilizer Restrictions Weight Bearing Restrictions: No      Mobility Bed Mobility Overal bed mobility: Independent                Transfers Overall transfer level: Independent                        ADL either performed or assessed with clinical judgement         Vision Patient Visual Report: No change from baseline              Pertinent Vitals/Pain Pain Assessment: No/denies pain     Hand Dominance     Extremity/Trunk Assessment Upper Extremity Assessment Upper Extremity Assessment: RUE deficits/detail RUE Deficits / Details: sling at all times.  NWB. Elbow, wrist and hand ROM allowed           Communication Communication Communication: No difficulties   Cognition Arousal/Alertness: Awake/alert Behavior During Therapy: WFL for tasks assessed/performed Overall Cognitive Status: Within Functional Limits for tasks assessed                                           Shoulder Instructions Shoulder  Instructions Donning/doffing shirt without moving shoulder: Minimal assistance Method for sponge bathing under operated UE: Minimal assistance Donning/doffing sling/immobilizer: Minimal assistance Correct positioning of sling/immobilizer: Minimal assistance Sling wearing schedule (on at all times/off for ADL's): Minimal assistance Proper positioning of operated UE when showering: Minimal assistance Positioning of UE while sleeping: Minimal assistance    Home Living Family/patient expects to be discharged to:: Private residence Living Arrangements: Alone Available Help at Discharge: Family Type of Home: House       Home Layout: One level               Home Equipment: None          Prior Functioning/Environment Level of Independence: Independent                 OT Problem List: Decreased strength;Decreased activity tolerance;Impaired balance (sitting and/or standing);Decreased range of motion      OT Treatment/Interventions: Self-care/ADL training;DME and/or AE instruction;Patient/family education    OT Goals(Current goals can be found in the care plan section) Acute Rehab OT Goals Patient Stated Goal: home OT Goal Formulation: With patient Time For Goal Achievement: 04/02/18 Potential to Achieve Goals: Good  OT Frequency: Min 2X/week   Barriers to D/C:               AM-PAC PT "6  Clicks" Daily Activity     Outcome Measure Help from another person eating meals?: A Little Help from another person taking care of personal grooming?: A Little Help from another person toileting, which includes using toliet, bedpan, or urinal?: A Little Help from another person bathing (including washing, rinsing, drying)?: A Little Help from another person to put on and taking off regular upper body clothing?: A Little Help from another person to put on and taking off regular lower body clothing?: A Little 6 Click Score: 18   End of Session Equipment Utilized During  Treatment: Gait belt Nurse Communication: Mobility status  Activity Tolerance: Patient limited by fatigue Patient left: in chair;with call bell/phone within reach  OT Visit Diagnosis: Unsteadiness on feet (R26.81);Muscle weakness (generalized) (M62.81)                Time: 6256-3893 OT Time Calculation (min): 13 min Charges:  OT General Charges $OT Visit: 1 Visit OT Evaluation $OT Eval Moderate Complexity: 1 Mod  Kari Baars, [place] [phone] Office- [phone]     Kenyetta Wimbish, Edwena Felty D 03/19/2018, 11:33 AM

## 2018-03-19 NOTE — Plan of Care (Signed)
  Problem: Education: Goal: Knowledge of General Education information will improve Description Including pain rating scale, medication(s)/side effects and non-pharmacologic comfort measures Outcome: Progressing   Problem: Health Behavior/Discharge Planning: Goal: Ability to manage health-related needs will improve Outcome: Progressing   Problem: Clinical Measurements: Goal: Ability to maintain clinical measurements within normal limits will improve Outcome: Progressing Goal: Will remain free from infection Outcome: Progressing Goal: Respiratory complications will improve Outcome: Progressing Goal: Cardiovascular complication will be avoided Outcome: Progressing   Problem: Activity: Goal: Risk for activity intolerance will decrease Outcome: Progressing   Problem: Nutrition: Goal: Adequate nutrition will be maintained Outcome: Progressing   Problem: Coping: Goal: Level of anxiety will decrease Outcome: Progressing   Problem: Elimination: Goal: Will not experience complications related to bowel motility Outcome: Progressing Goal: Will not experience complications related to urinary retention Outcome: Progressing   Problem: Pain Managment: Goal: General experience of comfort will improve Outcome: Progressing   Problem: Safety: Goal: Ability to remain free from injury will improve Outcome: Progressing   Problem: Skin Integrity: Goal: Risk for impaired skin integrity will decrease Outcome: Progressing   Problem: Education: Goal: Knowledge of the prescribed therapeutic regimen will improve Outcome: Progressing Goal: Understanding of activity limitations/precautions following surgery will improve Outcome: Progressing Goal: Individualized Educational Video(s) Outcome: Progressing   Problem: Activity: Goal: Ability to tolerate increased activity will improve Outcome: Progressing   Problem: Pain Management: Goal: Pain level will decrease with appropriate  interventions Outcome: Progressing

## 2018-03-19 NOTE — Progress Notes (Signed)
Occupational Therapy Treatment Patient Details Name: Nancy [name] MRN: [id number] DOB: 1935-10-04 Today's Date: 03/19/2018    History of present illness Pt s/p reverse shoulder .  Closed 4-part fracture of proximal end of right humerus, with malunion, subsequent encounter with AVN   OT comments  Pt will be getting blood this day.  OT provided handout and went over regarding ADL activity s/p shoulder sx. As well as provided education regarding elbow, wrist and hand ROM.  Pts nerve block still active and pt with limited active movement.    Pts sister will be Aing pt at home.  Will continue OT ed prior to DC next day.   Follow Up Recommendations  Follow surgeon's recommendation for DC plan and follow-up therapies    Equipment Recommendations  None recommended by OT    Recommendations for Other Services      Precautions / Restrictions Precautions Precautions: Shoulder Type of Shoulder Precautions: NWB Shoulder Interventions: Shoulder sling/immobilizer Required Braces or Orthoses: Sling Restrictions Weight Bearing Restrictions: No       Mobility Bed Mobility Overal bed mobility: Independent                Transfers Overall transfer level: Needs assistance   Transfers: Sit to/from Stand;Stand Pivot Transfers Sit to Stand: Min assist Stand pivot transfers: Min assist       General transfer comment: min A with toilet transfer.  Pt felt a little dizzy.  Pt will be getting blood. Pts BP 90/48        ADL either performed or assessed with clinical judgement   ADL Overall ADL's : Needs assistance/impaired                         Toilet Transfer: Minimal assistance;Comfort height toilet;Stand-pivot;Cueing for sequencing;Cueing for safety   Toileting- Clothing Manipulation and Hygiene: Minimal assistance;Sit to/from stand;Cueing for sequencing;Cueing for safety               Vision Patient Visual Report: No change from baseline             Cognition Arousal/Alertness: Awake/alert Behavior During Therapy: WFL for tasks assessed/performed Overall Cognitive Status: Within Functional Limits for tasks assessed                                             Shoulder Instructions Shoulder Instructions Donning/doffing shirt without moving shoulder: Minimal assistance Method for sponge bathing under operated UE: Minimal assistance Donning/doffing sling/immobilizer: Minimal assistance Correct positioning of sling/immobilizer: Minimal assistance Sling wearing schedule (on at all times/off for ADL's): Minimal assistance Proper positioning of operated UE when showering: Minimal assistance Positioning of UE while sleeping: Minimal assistance          Pertinent Vitals/ Pain       Pain Assessment: No/denies pain  Home Living Family/patient expects to be discharged to:: Private residence Living Arrangements: Alone Available Help at Discharge: Family Type of Home: House       Home Layout: One level               Home Equipment: None          Prior Functioning/Environment Level of Independence: Independent            Frequency  Min 2X/week        Progress Toward Goals  OT Goals(current goals can now  be found in the care plan section)  Progress towards OT goals: Progressing toward goals  Acute Rehab OT Goals Patient Stated Goal: home OT Goal Formulation: With patient Time For Goal Achievement: 04/02/18 Potential to Achieve Goals: Good ADL Goals Pt Will Perform Upper Body Bathing: with modified independence;sitting Pt Will Perform Upper Body Dressing: with supervision;standing Pt Will Transfer to Toilet: with modified independence;regular height toilet Pt Will Perform Toileting - Clothing Manipulation and hygiene: with modified independence;sit to/from stand Pt/caregiver will Perform Home Exercise Program: With written HEP provided  Plan Discharge plan remains appropriate     Co-evaluation                 AM-PAC PT "6 Clicks" Daily Activity     Outcome Measure   Help from another person eating meals?: A Little Help from another person taking care of personal grooming?: A Little Help from another person toileting, which includes using toliet, bedpan, or urinal?: A Little Help from another person bathing (including washing, rinsing, drying)?: A Little Help from another person to put on and taking off regular upper body clothing?: A Little Help from another person to put on and taking off regular lower body clothing?: A Little 6 Click Score: 18    End of Session Equipment Utilized During Treatment: Gait belt  OT Visit Diagnosis: Unsteadiness on feet (R26.81);Muscle weakness (generalized) (M62.81)   Activity Tolerance Patient limited by fatigue   Patient Left in chair;with call bell/phone within reach   Nurse Communication Mobility status        Time: 9983-3825 OT Time Calculation (min): 10 min  Charges: OT General Charges $OT Visit: 1 Visit OT Evaluation $OT Eval Moderate Complexity: 1 Mod OT Treatments $Self Care/Home Management : 8-22 mins  Kari Baars, OT Acute Rehabilitation Services [phone] Office- [phone]      Viola Placeres, Edwena Felty D 03/19/2018, 12:51 PM

## 2018-03-19 NOTE — Progress Notes (Addendum)
Patient ID: Nancy [name], female   DOB: 08/02/1935, 82 y.o.   MRN: [id number]     Subjective:  Patient reports pain as mild.  Patient denies any CP or SOB  Objective:   VITALS:   Vitals:   03/18/18 2109 03/18/18 2318 03/19/18 0212 03/19/18 0423  BP: (!) 102/47 (!) 113/50 (!) 110/47 (!) 110/52  Pulse: 73 73 86 79  Resp: 14 14 20 20   Temp: (!) 97.5 F (36.4 C) (!) 97.3 F (36.3 C) 97.6 F (36.4 C) 97.7 F (36.5 C)  TempSrc: Oral Oral Oral Oral  SpO2: 99% 100% 100% 100%  Weight:      Height:        ABD soft Sensation intact distally Dorsiflexion/Plantar flexion intact Incision: dressing C/D/I and no drainage Sling at all times  Lab Results  Component Value Date   WBC 9.2 03/19/2018   HGB 8.1 (L) 03/19/2018   HCT 24.6 (L) 03/19/2018   MCV 95.3 03/19/2018   PLT 178 03/19/2018   BMET    Component Value Date/Time   NA 143 03/19/2018 0356   K 4.8 03/19/2018 0356   CL 109 03/19/2018 0356   CO2 26 03/19/2018 0356   GLUCOSE 157 (H) 03/19/2018 0356   GLUCOSE 79 04/15/2006 0945   BUN 26 (H) 03/19/2018 0356   CREATININE 1.03 (H) 03/19/2018 0356   CALCIUM 8.3 (L) 03/19/2018 0356   GFRNONAA 50 (L) 03/19/2018 0356   GFRAA 58 (L) 03/19/2018 0356     Assessment/Plan: 1 Day Post-Op   Principal Problem:   Closed 4-part fracture of proximal end of right humerus, with malunion, subsequent encounter with AVN Active Problems:   Postoperative anemia due to acute blood loss   Advance diet Up with therapy DC home  NWB right upper ext Sling at all times Dry dressing PRN    DOUGLAS PARRY, BRANDON 03/19/2018, 8:08 AM  Discussed and agree with above. Will benefit from transfusion given symptoms.   Marchia Bond, MD Cell [phone]

## 2018-03-19 NOTE — Addendum Note (Signed)
Addendum  created 03/19/18 0934 by Lollie Sails, CRNA   Charge Capture section accepted

## 2018-03-19 NOTE — Addendum Note (Signed)
Addendum  created 03/19/18 0931 by Lollie Sails, CRNA   Charge Capture section accepted

## 2018-03-20 ENCOUNTER — Encounter (HOSPITAL_COMMUNITY): Payer: Self-pay | Admitting: Orthopedic Surgery

## 2018-03-20 LAB — TYPE AND SCREEN
ABO/RH(D): A POS
Antibody Screen: NEGATIVE
UNIT DIVISION: 0
UNIT DIVISION: 0

## 2018-03-20 LAB — BPAM RBC
BLOOD PRODUCT EXPIRATION DATE: 201910262359
Blood Product Expiration Date: 201910262359
ISSUE DATE / TIME: 201910021307
ISSUE DATE / TIME: 201910021621
UNIT TYPE AND RH: 6200
Unit Type and Rh: 6200

## 2018-03-20 NOTE — Discharge Summary (Signed)
Physician Discharge Summary  Patient ID: Nancy [name] MRN: [id number] DOB/AGE: [birth year] 82 y.o.  Admit date: 03/18/2018 Discharge date: 03/20/2018  Admission Diagnoses:  Closed 4-part fracture of proximal end of right humerus, with malunion, subsequent encounter  Discharge Diagnoses:  Principal Problem:   Closed 4-part fracture of proximal end of right humerus, with malunion, subsequent encounter with AVN Active Problems:   Postoperative anemia due to acute blood loss   Past Medical History:  Diagnosis Date  . Allergy   . Avascular necrosis (HCC)    RIGHT SHOULDER  . Chronic cough    saw several specialists per her report, NONE NOW  . Closed 4-part fracture of proximal end of right humerus, with malunion, subsequent encounter with AVN 03/18/2018  . Hypothyroidism   . Osteoporosis    Sees Dr. Layne Benton at [place], on prolia  . PONV (postoperative nausea and vomiting)     Surgeries: Procedure(s): REVERSE RIGHT SHOULDER ARTHROPLASTY on 03/18/2018   Consultants (if any):   Discharged Condition: Improved  Hospital Course: Nancy [name] is an 82 y.o. female who was admitted 03/18/2018 with a diagnosis of Closed 4-part fracture of proximal end of right humerus, with malunion, subsequent encounter and went to the operating room on 03/18/2018 and underwent the above named procedures.    She was given perioperative antibiotics:  Anti-infectives (From admission, onward)   Start     Dose/Rate Route Frequency Ordered Stop   03/18/18 2000  ceFAZolin (ANCEF) IVPB 1 g/50 mL premix     1 g 100 mL/hr over 30 Minutes Intravenous Every 6 hours 03/18/18 1828 03/19/18 0934   03/18/18 1248  ceFAZolin (ANCEF) 2-4 GM/100ML-% IVPB    Note to Pharmacy:  Randa Evens  : cabinet override      03/18/18 1248 03/18/18 1409   03/18/18 1130  ceFAZolin (ANCEF) IVPB 2g/100 mL premix     2 g 200 mL/hr over 30 Minutes Intravenous On call to O.R. 03/18/18 1127 03/18/18 1439   03/18/18  1130  ceFAZolin (ANCEF) IVPB 2g/100 mL premix  Status:  Discontinued     2 g 200 mL/hr over 30 Minutes Intravenous On call to O.R. 03/18/18 1127 03/18/18 1146    .  She was given sequential compression devices, early ambulation,  for DVT prophylaxis. She had light headedness and dizziness on POD 1 with a hgb of 8.1 and was transfused 2 units of prbcs.   She benefited maximally from the hospital stay and there were no complications.    Recent vital signs:  Vitals:   03/19/18 2129 03/20/18 0541  BP: (!) 115/48 117/70  Pulse: 87 100  Resp: 17 17  Temp: 98.2 F (36.8 C) 98.3 F (36.8 C)  SpO2: 98% 99%    Recent laboratory studies:  Lab Results  Component Value Date   HGB 8.1 (L) 03/19/2018   HGB 12.0 03/13/2018   HGB 11.5 (L) 01/30/2018   Lab Results  Component Value Date   WBC 9.2 03/19/2018   PLT 178 03/19/2018   No results found for: INR Lab Results  Component Value Date   NA 143 03/19/2018   K 4.8 03/19/2018   CL 109 03/19/2018   CO2 26 03/19/2018   BUN 26 (H) 03/19/2018   CREATININE 1.03 (H) 03/19/2018   GLUCOSE 157 (H) 03/19/2018    Discharge Medications:   Allergies as of 03/20/2018      Reactions   Norco [hydrocodone-acetaminophen] Hives   Penicillins Hives   Has patient had  a PCN reaction causing immediate rash, facial/tongue/throat swelling, SOB or lightheadedness with hypotension: yes Has patient had a PCN reaction causing severe rash involving mucus membranes or skin necrosis: no Has patient had a PCN reaction that required hospitalization: no Has patient had a PCN reaction occurring within the last 10 years: yes If all of the above answers are "NO", then may proceed with Cephalosporin use.   Sulfamethoxazole Hives      Medication List    TAKE these medications   acetaminophen 500 MG tablet Commonly known as:  TYLENOL Take 1,000 mg by mouth every 8 (eight) hours as needed (pain).   baclofen 10 MG tablet Commonly known as:  LIORESAL Take 1  tablet (10 mg total) by mouth 3 (three) times daily. As needed for muscle spasm   GNP COLD/HOT PAIN RELIEF PATCH EX Apply 1 patch topically daily.   levothyroxine 75 MCG tablet Commonly known as:  SYNTHROID, LEVOTHROID TAKE 1 TABLET BY MOUTH  DAILY BEFORE BREAKFAST What changed:    how much to take  how to take this  when to take this   meloxicam 15 MG tablet Commonly known as:  MOBIC Take 15 mg by mouth daily.   traMADol 50 MG tablet Commonly known as:  ULTRAM Take 1 tablet (50 mg total) by mouth every 6 (six) hours as needed.   traZODone 50 MG tablet Commonly known as:  DESYREL Take 0.5-1 tablets (25-50 mg total) by mouth at bedtime.       Diagnostic Studies: Dg Shoulder Right Port  Result Date: 03/18/2018 CLINICAL DATA:  82 year old female post shoulder replacement. Subsequent encounter. EXAM: PORTABLE RIGHT SHOULDER COMPARISON:  01/28/2018 CT. FINDINGS: Post reversed total right shoulder replacement which appears in satisfactory position on single image obtained. Spurring undersurface of the acromion. Right lung base subsegmental atelectasis. Right apical pleural thickening. IMPRESSION: 1. Post reversed total right shoulder replacement. 2. Spurring undersurface of the acromion. 3. Right lung base subsegmental atelectasis. Electronically Signed   By: Genia Del M.D.   On: 03/18/2018 17:40    Disposition:     Follow-up Information    Marchia Bond, MD. Schedule an appointment as soon as possible for a visit in 2 weeks.   Specialty:  Orthopedic Surgery Contact information: [address] [place] [address] [phone]            Signed: Johnny Bridge 03/20/2018, 12:24 PM

## 2018-03-20 NOTE — Progress Notes (Signed)
Occupational Therapy Treatment Patient Details Name: Nancy [name] MRN: [id number] DOB: 1935-12-09 Today's Date: 03/20/2018    History of present illness Pt s/p reverse shoulder .  Closed 4-part fracture of proximal end of right humerus, with malunion, subsequent encounter with AVN   OT comments  Pt able to perform elbow flexion and extension, wrist flexion/extensin and finger flexion/extension with S  Follow Up Recommendations  Follow surgeon's recommendation for DC plan and follow-up therapies    Equipment Recommendations  None recommended by OT    Recommendations for Other Services      Precautions / Restrictions Precautions Precautions: Shoulder Type of Shoulder Precautions: NWB Shoulder Interventions: Shoulder sling/immobilizer Required Braces or Orthoses: Sling       Mobility Bed Mobility               General bed mobility comments: pt in chair  Transfers Overall transfer level: Needs assistance   Transfers: Sit to/from Stand;Stand Pivot Transfers Sit to Stand: Min guard Stand pivot transfers: Min guard                ADL either performed or assessed with clinical judgement   ADL Overall ADL's : Needs assistance/impaired     Grooming: Standing;Set up           Upper Body Dressing : Minimal assistance;Sitting   Lower Body Dressing: Minimal assistance;Sit to/from stand   Toilet Transfer: Comfort height toilet;Stand-pivot;Cueing for sequencing;Cueing for safety;Supervision/safety   Toileting- Clothing Manipulation and Hygiene: Sit to/from stand;Cueing for sequencing;Cueing for safety;Supervision/safety               Vision Baseline Vision/History: No visual deficits            Cognition Arousal/Alertness: Awake/alert Behavior During Therapy: WFL for tasks assessed/performed Overall Cognitive Status: Within Functional Limits for tasks assessed                                          Exercises      Shoulder Instructions Shoulder Instructions Donning/doffing shirt without moving shoulder: Independent Method for sponge bathing under operated UE: Minimal assistance Donning/doffing sling/immobilizer: Minimal assistance Correct positioning of sling/immobilizer: Minimal assistance Sling wearing schedule (on at all times/off for ADL's): Supervision/safety;Independent Proper positioning of operated UE when showering: Independent Positioning of UE while sleeping: Minimal assistance          Pertinent Vitals/ Pain       Pain Assessment: No/denies pain         Frequency  Min 2X/week        Progress Toward Goals  OT Goals(current goals can now be found in the care plan section)  Progress towards OT goals: Progressing toward goals     Plan Discharge plan remains appropriate       AM-PAC PT "6 Clicks" Daily Activity     Outcome Measure   Help from another person eating meals?: A Little Help from another person taking care of personal grooming?: A Little Help from another person toileting, which includes using toliet, bedpan, or urinal?: A Little Help from another person bathing (including washing, rinsing, drying)?: A Little Help from another person to put on and taking off regular upper body clothing?: A Little Help from another person to put on and taking off regular lower body clothing?: A Little 6 Click Score: 18    End of Session Equipment Utilized During Treatment: Gait belt  OT Visit Diagnosis: Unsteadiness on feet (R26.81);Muscle weakness (generalized) (M62.81)   Activity Tolerance Patient limited by fatigue   Patient Left in chair;with call bell/phone within reach   Nurse Communication Mobility status        Time: 1018-1030 OT Time Calculation (min): 12 min  Charges: OT General Charges $OT Visit: 1 Visit OT Treatments $Self Care/Home Management : 8-22 mins  Kari Baars, [place] [phone] Office- [place]  [name], Edwena Felty D 03/20/2018, 1:44 PM

## 2018-03-31 DIAGNOSIS — M19011 Primary osteoarthritis, right shoulder: Secondary | ICD-10-CM | POA: Diagnosis not present

## 2018-04-28 DIAGNOSIS — M19011 Primary osteoarthritis, right shoulder: Secondary | ICD-10-CM | POA: Diagnosis not present

## 2018-05-02 DIAGNOSIS — M25511 Pain in right shoulder: Secondary | ICD-10-CM | POA: Diagnosis not present

## 2018-05-02 DIAGNOSIS — M810 Age-related osteoporosis without current pathological fracture: Secondary | ICD-10-CM | POA: Diagnosis not present

## 2018-05-02 DIAGNOSIS — E559 Vitamin D deficiency, unspecified: Secondary | ICD-10-CM | POA: Diagnosis not present

## 2018-05-02 DIAGNOSIS — R5383 Other fatigue: Secondary | ICD-10-CM | POA: Diagnosis not present

## 2018-05-02 DIAGNOSIS — M6281 Muscle weakness (generalized): Secondary | ICD-10-CM | POA: Diagnosis not present

## 2018-05-02 DIAGNOSIS — M25611 Stiffness of right shoulder, not elsewhere classified: Secondary | ICD-10-CM | POA: Diagnosis not present

## 2018-05-05 ENCOUNTER — Other Ambulatory Visit: Payer: Self-pay | Admitting: Family Medicine

## 2018-05-06 DIAGNOSIS — M25611 Stiffness of right shoulder, not elsewhere classified: Secondary | ICD-10-CM | POA: Diagnosis not present

## 2018-05-06 DIAGNOSIS — M25511 Pain in right shoulder: Secondary | ICD-10-CM | POA: Diagnosis not present

## 2018-05-06 DIAGNOSIS — M6281 Muscle weakness (generalized): Secondary | ICD-10-CM | POA: Diagnosis not present

## 2018-05-08 DIAGNOSIS — M25511 Pain in right shoulder: Secondary | ICD-10-CM | POA: Diagnosis not present

## 2018-05-08 DIAGNOSIS — M25611 Stiffness of right shoulder, not elsewhere classified: Secondary | ICD-10-CM | POA: Diagnosis not present

## 2018-05-08 DIAGNOSIS — M6281 Muscle weakness (generalized): Secondary | ICD-10-CM | POA: Diagnosis not present

## 2018-05-12 DIAGNOSIS — M6281 Muscle weakness (generalized): Secondary | ICD-10-CM | POA: Diagnosis not present

## 2018-05-12 DIAGNOSIS — M25611 Stiffness of right shoulder, not elsewhere classified: Secondary | ICD-10-CM | POA: Diagnosis not present

## 2018-05-12 DIAGNOSIS — M25511 Pain in right shoulder: Secondary | ICD-10-CM | POA: Diagnosis not present

## 2018-05-20 DIAGNOSIS — M6281 Muscle weakness (generalized): Secondary | ICD-10-CM | POA: Diagnosis not present

## 2018-05-20 DIAGNOSIS — M25511 Pain in right shoulder: Secondary | ICD-10-CM | POA: Diagnosis not present

## 2018-05-20 DIAGNOSIS — M25611 Stiffness of right shoulder, not elsewhere classified: Secondary | ICD-10-CM | POA: Diagnosis not present

## 2018-05-21 DIAGNOSIS — M19011 Primary osteoarthritis, right shoulder: Secondary | ICD-10-CM | POA: Diagnosis not present

## 2018-05-21 DIAGNOSIS — M810 Age-related osteoporosis without current pathological fracture: Secondary | ICD-10-CM | POA: Diagnosis not present

## 2018-05-23 DIAGNOSIS — M25511 Pain in right shoulder: Secondary | ICD-10-CM | POA: Diagnosis not present

## 2018-05-23 DIAGNOSIS — M25611 Stiffness of right shoulder, not elsewhere classified: Secondary | ICD-10-CM | POA: Diagnosis not present

## 2018-05-23 DIAGNOSIS — M6281 Muscle weakness (generalized): Secondary | ICD-10-CM | POA: Diagnosis not present

## 2018-05-26 DIAGNOSIS — Z90710 Acquired absence of both cervix and uterus: Secondary | ICD-10-CM | POA: Diagnosis not present

## 2018-05-26 DIAGNOSIS — M810 Age-related osteoporosis without current pathological fracture: Secondary | ICD-10-CM | POA: Diagnosis not present

## 2018-05-26 DIAGNOSIS — M19011 Primary osteoarthritis, right shoulder: Secondary | ICD-10-CM | POA: Diagnosis not present

## 2018-05-26 LAB — HM DEXA SCAN

## 2018-05-27 DIAGNOSIS — M25611 Stiffness of right shoulder, not elsewhere classified: Secondary | ICD-10-CM | POA: Diagnosis not present

## 2018-05-27 DIAGNOSIS — M6281 Muscle weakness (generalized): Secondary | ICD-10-CM | POA: Diagnosis not present

## 2018-05-27 DIAGNOSIS — M25511 Pain in right shoulder: Secondary | ICD-10-CM | POA: Diagnosis not present

## 2018-05-29 DIAGNOSIS — M6281 Muscle weakness (generalized): Secondary | ICD-10-CM | POA: Diagnosis not present

## 2018-05-29 DIAGNOSIS — M25611 Stiffness of right shoulder, not elsewhere classified: Secondary | ICD-10-CM | POA: Diagnosis not present

## 2018-05-29 DIAGNOSIS — M25511 Pain in right shoulder: Secondary | ICD-10-CM | POA: Diagnosis not present

## 2018-06-03 DIAGNOSIS — M25511 Pain in right shoulder: Secondary | ICD-10-CM | POA: Diagnosis not present

## 2018-06-03 DIAGNOSIS — M6281 Muscle weakness (generalized): Secondary | ICD-10-CM | POA: Diagnosis not present

## 2018-06-03 DIAGNOSIS — M25611 Stiffness of right shoulder, not elsewhere classified: Secondary | ICD-10-CM | POA: Diagnosis not present

## 2018-06-06 DIAGNOSIS — M25611 Stiffness of right shoulder, not elsewhere classified: Secondary | ICD-10-CM | POA: Diagnosis not present

## 2018-06-06 DIAGNOSIS — M6281 Muscle weakness (generalized): Secondary | ICD-10-CM | POA: Diagnosis not present

## 2018-06-06 DIAGNOSIS — M25511 Pain in right shoulder: Secondary | ICD-10-CM | POA: Diagnosis not present

## 2018-06-09 ENCOUNTER — Encounter: Payer: Self-pay | Admitting: Family Medicine

## 2018-06-13 DIAGNOSIS — M25611 Stiffness of right shoulder, not elsewhere classified: Secondary | ICD-10-CM | POA: Diagnosis not present

## 2018-06-13 DIAGNOSIS — M6281 Muscle weakness (generalized): Secondary | ICD-10-CM | POA: Diagnosis not present

## 2018-06-13 DIAGNOSIS — M25511 Pain in right shoulder: Secondary | ICD-10-CM | POA: Diagnosis not present

## 2018-06-19 DIAGNOSIS — M25611 Stiffness of right shoulder, not elsewhere classified: Secondary | ICD-10-CM | POA: Diagnosis not present

## 2018-06-19 DIAGNOSIS — M25511 Pain in right shoulder: Secondary | ICD-10-CM | POA: Diagnosis not present

## 2018-06-19 DIAGNOSIS — M6281 Muscle weakness (generalized): Secondary | ICD-10-CM | POA: Diagnosis not present

## 2018-06-24 DIAGNOSIS — M25511 Pain in right shoulder: Secondary | ICD-10-CM | POA: Diagnosis not present

## 2018-06-24 DIAGNOSIS — M6281 Muscle weakness (generalized): Secondary | ICD-10-CM | POA: Diagnosis not present

## 2018-06-24 DIAGNOSIS — M25611 Stiffness of right shoulder, not elsewhere classified: Secondary | ICD-10-CM | POA: Diagnosis not present

## 2018-06-26 DIAGNOSIS — M25511 Pain in right shoulder: Secondary | ICD-10-CM | POA: Diagnosis not present

## 2018-06-26 DIAGNOSIS — M6281 Muscle weakness (generalized): Secondary | ICD-10-CM | POA: Diagnosis not present

## 2018-06-26 DIAGNOSIS — M25611 Stiffness of right shoulder, not elsewhere classified: Secondary | ICD-10-CM | POA: Diagnosis not present

## 2018-07-01 DIAGNOSIS — M25611 Stiffness of right shoulder, not elsewhere classified: Secondary | ICD-10-CM | POA: Diagnosis not present

## 2018-07-01 DIAGNOSIS — M25511 Pain in right shoulder: Secondary | ICD-10-CM | POA: Diagnosis not present

## 2018-07-01 DIAGNOSIS — M6281 Muscle weakness (generalized): Secondary | ICD-10-CM | POA: Diagnosis not present

## 2018-07-03 DIAGNOSIS — M6281 Muscle weakness (generalized): Secondary | ICD-10-CM | POA: Diagnosis not present

## 2018-07-03 DIAGNOSIS — M25511 Pain in right shoulder: Secondary | ICD-10-CM | POA: Diagnosis not present

## 2018-07-03 DIAGNOSIS — M25611 Stiffness of right shoulder, not elsewhere classified: Secondary | ICD-10-CM | POA: Diagnosis not present

## 2018-07-07 DIAGNOSIS — M25511 Pain in right shoulder: Secondary | ICD-10-CM | POA: Diagnosis not present

## 2018-07-09 DIAGNOSIS — M25511 Pain in right shoulder: Secondary | ICD-10-CM | POA: Diagnosis not present

## 2018-07-09 DIAGNOSIS — M25611 Stiffness of right shoulder, not elsewhere classified: Secondary | ICD-10-CM | POA: Diagnosis not present

## 2018-07-09 DIAGNOSIS — M6281 Muscle weakness (generalized): Secondary | ICD-10-CM | POA: Diagnosis not present

## 2018-07-15 DIAGNOSIS — M25511 Pain in right shoulder: Secondary | ICD-10-CM | POA: Diagnosis not present

## 2018-07-15 DIAGNOSIS — M25611 Stiffness of right shoulder, not elsewhere classified: Secondary | ICD-10-CM | POA: Diagnosis not present

## 2018-07-15 DIAGNOSIS — M6281 Muscle weakness (generalized): Secondary | ICD-10-CM | POA: Diagnosis not present

## 2018-07-17 DIAGNOSIS — M25511 Pain in right shoulder: Secondary | ICD-10-CM | POA: Diagnosis not present

## 2018-07-17 DIAGNOSIS — M6281 Muscle weakness (generalized): Secondary | ICD-10-CM | POA: Diagnosis not present

## 2018-07-17 DIAGNOSIS — M25611 Stiffness of right shoulder, not elsewhere classified: Secondary | ICD-10-CM | POA: Diagnosis not present

## 2018-07-22 DIAGNOSIS — M25511 Pain in right shoulder: Secondary | ICD-10-CM | POA: Diagnosis not present

## 2018-07-22 DIAGNOSIS — M6281 Muscle weakness (generalized): Secondary | ICD-10-CM | POA: Diagnosis not present

## 2018-07-22 DIAGNOSIS — M25611 Stiffness of right shoulder, not elsewhere classified: Secondary | ICD-10-CM | POA: Diagnosis not present

## 2018-07-24 DIAGNOSIS — M6281 Muscle weakness (generalized): Secondary | ICD-10-CM | POA: Diagnosis not present

## 2018-07-24 DIAGNOSIS — M25611 Stiffness of right shoulder, not elsewhere classified: Secondary | ICD-10-CM | POA: Diagnosis not present

## 2018-07-24 DIAGNOSIS — M25511 Pain in right shoulder: Secondary | ICD-10-CM | POA: Diagnosis not present

## 2018-07-29 DIAGNOSIS — M25511 Pain in right shoulder: Secondary | ICD-10-CM | POA: Diagnosis not present

## 2018-07-29 DIAGNOSIS — M25611 Stiffness of right shoulder, not elsewhere classified: Secondary | ICD-10-CM | POA: Diagnosis not present

## 2018-07-29 DIAGNOSIS — M6281 Muscle weakness (generalized): Secondary | ICD-10-CM | POA: Diagnosis not present

## 2018-07-31 DIAGNOSIS — M25611 Stiffness of right shoulder, not elsewhere classified: Secondary | ICD-10-CM | POA: Diagnosis not present

## 2018-07-31 DIAGNOSIS — M25511 Pain in right shoulder: Secondary | ICD-10-CM | POA: Diagnosis not present

## 2018-07-31 DIAGNOSIS — M6281 Muscle weakness (generalized): Secondary | ICD-10-CM | POA: Diagnosis not present

## 2018-08-05 DIAGNOSIS — M25511 Pain in right shoulder: Secondary | ICD-10-CM | POA: Diagnosis not present

## 2018-08-05 DIAGNOSIS — M6281 Muscle weakness (generalized): Secondary | ICD-10-CM | POA: Diagnosis not present

## 2018-08-05 DIAGNOSIS — M25611 Stiffness of right shoulder, not elsewhere classified: Secondary | ICD-10-CM | POA: Diagnosis not present

## 2018-08-07 DIAGNOSIS — M25611 Stiffness of right shoulder, not elsewhere classified: Secondary | ICD-10-CM | POA: Diagnosis not present

## 2018-08-07 DIAGNOSIS — M25511 Pain in right shoulder: Secondary | ICD-10-CM | POA: Diagnosis not present

## 2018-08-07 DIAGNOSIS — M6281 Muscle weakness (generalized): Secondary | ICD-10-CM | POA: Diagnosis not present

## 2018-08-22 DIAGNOSIS — H5203 Hypermetropia, bilateral: Secondary | ICD-10-CM | POA: Diagnosis not present

## 2018-08-22 DIAGNOSIS — D3131 Benign neoplasm of right choroid: Secondary | ICD-10-CM | POA: Diagnosis not present

## 2018-08-22 DIAGNOSIS — H2513 Age-related nuclear cataract, bilateral: Secondary | ICD-10-CM | POA: Diagnosis not present

## 2018-08-22 DIAGNOSIS — H52203 Unspecified astigmatism, bilateral: Secondary | ICD-10-CM | POA: Diagnosis not present

## 2018-09-16 ENCOUNTER — Telehealth: Payer: Self-pay

## 2018-09-16 NOTE — Telephone Encounter (Signed)
Author phoned pt. to assess interest in scheduling virtual awv. No answer, unable to leave VM.

## 2018-09-29 ENCOUNTER — Other Ambulatory Visit: Payer: Self-pay | Admitting: Family Medicine

## 2018-12-09 ENCOUNTER — Encounter: Payer: Self-pay | Admitting: Family Medicine

## 2018-12-09 DIAGNOSIS — M810 Age-related osteoporosis without current pathological fracture: Secondary | ICD-10-CM | POA: Diagnosis not present

## 2018-12-09 DIAGNOSIS — R5383 Other fatigue: Secondary | ICD-10-CM | POA: Diagnosis not present

## 2018-12-09 DIAGNOSIS — E559 Vitamin D deficiency, unspecified: Secondary | ICD-10-CM | POA: Diagnosis not present

## 2018-12-10 ENCOUNTER — Telehealth: Payer: Self-pay | Admitting: Family Medicine

## 2018-12-10 NOTE — Telephone Encounter (Signed)
Patient has an appt with Dr Maudie Mercury per Dr. Layne Benton next Thursday 12/18/18.  Lab results are being sent over from Dr. Kathrin Penner office for this appt.

## 2018-12-16 DIAGNOSIS — M810 Age-related osteoporosis without current pathological fracture: Secondary | ICD-10-CM | POA: Diagnosis not present

## 2018-12-16 DIAGNOSIS — M25511 Pain in right shoulder: Secondary | ICD-10-CM | POA: Diagnosis not present

## 2018-12-18 ENCOUNTER — Ambulatory Visit (INDEPENDENT_AMBULATORY_CARE_PROVIDER_SITE_OTHER): Payer: Medicare Other | Admitting: Family Medicine

## 2018-12-18 ENCOUNTER — Other Ambulatory Visit: Payer: Self-pay

## 2018-12-18 ENCOUNTER — Encounter: Payer: Self-pay | Admitting: Family Medicine

## 2018-12-18 DIAGNOSIS — E039 Hypothyroidism, unspecified: Secondary | ICD-10-CM

## 2018-12-18 DIAGNOSIS — R7401 Elevation of levels of liver transaminase levels: Secondary | ICD-10-CM

## 2018-12-18 DIAGNOSIS — E785 Hyperlipidemia, unspecified: Secondary | ICD-10-CM | POA: Diagnosis not present

## 2018-12-18 DIAGNOSIS — R740 Nonspecific elevation of levels of transaminase and lactic acid dehydrogenase [LDH]: Secondary | ICD-10-CM

## 2018-12-18 DIAGNOSIS — M810 Age-related osteoporosis without current pathological fracture: Secondary | ICD-10-CM

## 2018-12-18 NOTE — Progress Notes (Signed)
Virtual Visit via Telephone Note  I connected with Nancy [name] on 12/18/18 at 10:40 AM EDT by telephone and verified that I am speaking with the correct person using two identifiers.   Location patient: home Location provider: work or home office Participants present for the call: patient, provider Patient did not have a visit in the prior 7 days to address this/these issue(s).   History of Present Illness:  Acute visit for transaminitis. Reports AST 105 and ALT 103 on recent labs for prolia check with Dr. Cyd Silence. Alk phos, tot protein and bilirubin were normal. She had been taking a number of supplements. She stopped all of them since the lab check. She had been taking tylenol on a regular basis as well the last few months as well - had become a habit after shoulder surgery. She stopped that as well. She does not drink any alcohol. She has felt fine. She denies fatigue, nausea, vomiting, abd pain, bowel changes. She works outside in the yard all the time and walks 1.5 hours several times per week and feels great. Since the lab check last week, the only medicatios she is taking is are synthroid and claritin for allergies. WBC 6.7 RBC 3.76 Hgb 11.6 - stable and she reports Dr. Cyd Silence felt all other labs were stable and they did the prolia injection which she reports has helped her osteoporosis immensely.   Observations/Objective: Patient sounds cheerful and well on the phone. I do not appreciate any SOB. Speech and thought processing are grossly intact. Patient reported vitals:  Assessment and Plan:  Transaminitis Hyperlipidemia Hypothyroidism OSteoporosis  Discussed potential etiologies, options for evaluation, management, risks and precautions for the new transaminitis. I do not have the lab report, but the patient read off the lab report for the values. She has mild elevation of AST and ALT and no symptoms. Opted to recheck labs after a few weeks off of the tyelnol and  supplements. Will follow up after labs for her AWV which is due.  Follow Up Instructions:  I did not refer this patient for an OV in the next 24 hours for this/these issue(s).  I discussed the assessment and treatment plan with the patient. The patient was provided an opportunity to ask questions and all were answered. The patient agreed with the plan and demonstrated an understanding of the instructions.   The patient was advised to call back or seek an in-person evaluation if the symptoms worsen or if the condition fails to improve as anticipated.  I provided 15 minutes of non-face-to-face time during this encounter.   Follow up instructions: Advised assistant Wendie Simmer to help patient arrange the following: -lab visit in 2 - 3 weeks - fasting -AWV with Dr. Maudie Mercury in 3-4 weeks  Lucretia Kern, DO

## 2018-12-23 ENCOUNTER — Telehealth: Payer: Self-pay | Admitting: *Deleted

## 2018-12-23 NOTE — Telephone Encounter (Signed)
-----   Message from Lucretia Kern, DO sent at 12/18/2018 11:19 AM EDT ----- -lab visit in 2 - 3 weeks - fasting-AWV with Dr. Maudie Mercury in 3-4 weeks

## 2018-12-23 NOTE — Telephone Encounter (Signed)
I left a detailed message at the pts home number to call and schedule appts as below.

## 2019-01-07 ENCOUNTER — Other Ambulatory Visit: Payer: Self-pay

## 2019-01-07 ENCOUNTER — Other Ambulatory Visit (INDEPENDENT_AMBULATORY_CARE_PROVIDER_SITE_OTHER): Payer: Medicare Other

## 2019-01-07 DIAGNOSIS — E039 Hypothyroidism, unspecified: Secondary | ICD-10-CM | POA: Diagnosis not present

## 2019-01-07 DIAGNOSIS — R7401 Elevation of levels of liver transaminase levels: Secondary | ICD-10-CM

## 2019-01-07 DIAGNOSIS — R740 Nonspecific elevation of levels of transaminase and lactic acid dehydrogenase [LDH]: Secondary | ICD-10-CM | POA: Diagnosis not present

## 2019-01-07 DIAGNOSIS — E785 Hyperlipidemia, unspecified: Secondary | ICD-10-CM

## 2019-01-07 LAB — COMPREHENSIVE METABOLIC PANEL
ALT: 34 U/L (ref 0–35)
AST: 40 U/L — ABNORMAL HIGH (ref 0–37)
Albumin: 4.4 g/dL (ref 3.5–5.2)
Alkaline Phosphatase: 34 U/L — ABNORMAL LOW (ref 39–117)
BUN: 20 mg/dL (ref 6–23)
CO2: 27 mEq/L (ref 19–32)
Calcium: 9.3 mg/dL (ref 8.4–10.5)
Chloride: 107 mEq/L (ref 96–112)
Creatinine, Ser: 1.06 mg/dL (ref 0.40–1.20)
GFR: 49.73 mL/min — ABNORMAL LOW (ref >60.00)
Glucose, Bld: 85 mg/dL (ref 70–99)
Potassium: 5.1 mEq/L (ref 3.5–5.1)
Sodium: 141 mEq/L (ref 135–145)
Total Bilirubin: 0.5 mg/dL (ref 0.2–1.2)
Total Protein: 6.7 g/dL (ref 6.0–8.3)

## 2019-01-07 LAB — LIPID PANEL
Cholesterol: 164 mg/dL (ref 0–200)
HDL: 52.4 mg/dL (ref >39.00)
LDL Cholesterol: 94 mg/dL (ref 0–99)
NonHDL: 111.62
Total CHOL/HDL Ratio: 3
Triglycerides: 87 mg/dL (ref 0.0–149.0)
VLDL: 17.4 mg/dL (ref 0.0–40.0)

## 2019-01-07 LAB — TSH: TSH: 0.25 u[IU]/mL — ABNORMAL LOW (ref 0.35–4.50)

## 2019-01-16 ENCOUNTER — Telehealth: Payer: Self-pay | Admitting: Family Medicine

## 2019-01-16 MED ORDER — LEVOTHYROXINE SODIUM 50 MCG PO TABS: 50.0000 ug | ORAL_TABLET | Freq: Every day | ORAL | 1 refills | Status: DC

## 2019-01-16 NOTE — Telephone Encounter (Signed)
See results note. 

## 2019-01-16 NOTE — Telephone Encounter (Signed)
[phone] Patient is calling back for her lab results. Thank you

## 2019-01-19 ENCOUNTER — Other Ambulatory Visit: Payer: Self-pay

## 2019-01-21 NOTE — Telephone Encounter (Signed)
See note

## 2019-01-21 NOTE — Telephone Encounter (Signed)
Pt is returning jo ann call concerning liver test and dr Maudie Mercury recommendation

## 2019-01-22 NOTE — Addendum Note (Signed)
Addended by: Agnes Lawrence on: 01/22/2019 03:27 PM   Modules accepted: Orders

## 2019-01-22 NOTE — Telephone Encounter (Signed)
See results note. 

## 2019-02-27 ENCOUNTER — Other Ambulatory Visit (INDEPENDENT_AMBULATORY_CARE_PROVIDER_SITE_OTHER): Payer: Medicare Other

## 2019-02-27 ENCOUNTER — Other Ambulatory Visit: Payer: Self-pay

## 2019-02-27 DIAGNOSIS — R740 Nonspecific elevation of levels of transaminase and lactic acid dehydrogenase [LDH]: Secondary | ICD-10-CM

## 2019-02-27 DIAGNOSIS — E039 Hypothyroidism, unspecified: Secondary | ICD-10-CM | POA: Diagnosis not present

## 2019-02-27 DIAGNOSIS — R7401 Elevation of levels of liver transaminase levels: Secondary | ICD-10-CM

## 2019-02-27 LAB — HEPATIC FUNCTION PANEL
ALT: 11 U/L (ref 0–35)
AST: 18 U/L (ref 0–37)
Albumin: 4.3 g/dL (ref 3.5–5.2)
Alkaline Phosphatase: 31 U/L — ABNORMAL LOW (ref 39–117)
Bilirubin, Direct: 0.1 mg/dL (ref 0.0–0.3)
Total Bilirubin: 0.5 mg/dL (ref 0.2–1.2)
Total Protein: 6.8 g/dL (ref 6.0–8.3)

## 2019-02-27 LAB — TSH: TSH: 1.88 u[IU]/mL (ref 0.35–4.50)

## 2019-03-09 ENCOUNTER — Ambulatory Visit: Payer: Medicare Other

## 2019-03-18 DIAGNOSIS — M720 Palmar fascial fibromatosis [Dupuytren]: Secondary | ICD-10-CM | POA: Diagnosis not present

## 2019-03-26 DIAGNOSIS — Z23 Encounter for immunization: Secondary | ICD-10-CM | POA: Diagnosis not present

## 2019-03-30 ENCOUNTER — Encounter (HOSPITAL_BASED_OUTPATIENT_CLINIC_OR_DEPARTMENT_OTHER): Payer: Self-pay

## 2019-03-30 ENCOUNTER — Other Ambulatory Visit: Payer: Self-pay

## 2019-04-02 ENCOUNTER — Other Ambulatory Visit (HOSPITAL_COMMUNITY)
Admission: RE | Admit: 2019-04-02 | Discharge: 2019-04-02 | Disposition: A | Discharge status: Home / Self Care | Payer: Medicare Other | Source: Ambulatory Visit | Attending: Specialist | Admitting: Specialist

## 2019-04-02 DIAGNOSIS — Z20828 Contact with and (suspected) exposure to other viral communicable diseases: Secondary | ICD-10-CM | POA: Insufficient documentation

## 2019-04-02 DIAGNOSIS — Z01812 Encounter for preprocedural laboratory examination: Secondary | ICD-10-CM | POA: Insufficient documentation

## 2019-04-04 LAB — NOVEL CORONAVIRUS, NAA (HOSP ORDER, SEND-OUT TO REF LAB; TAT 18-24 HRS): SARS-CoV-2, NAA: NOT DETECTED

## 2019-04-06 ENCOUNTER — Ambulatory Visit (HOSPITAL_BASED_OUTPATIENT_CLINIC_OR_DEPARTMENT_OTHER)
Admission: RE | Admit: 2019-04-06 | Discharge: 2019-04-06 | Disposition: A | Discharge status: Home / Self Care | Payer: Medicare Other | Source: Ambulatory Visit | Attending: Specialist | Admitting: Specialist

## 2019-04-06 ENCOUNTER — Ambulatory Visit (HOSPITAL_BASED_OUTPATIENT_CLINIC_OR_DEPARTMENT_OTHER): Payer: Medicare Other | Admitting: Anesthesiology

## 2019-04-06 ENCOUNTER — Encounter (HOSPITAL_BASED_OUTPATIENT_CLINIC_OR_DEPARTMENT_OTHER): Payer: Self-pay | Admitting: *Deleted

## 2019-04-06 ENCOUNTER — Encounter (HOSPITAL_BASED_OUTPATIENT_CLINIC_OR_DEPARTMENT_OTHER)
Admission: RE | Disposition: A | Discharge status: Home / Self Care | Payer: Self-pay | Source: Ambulatory Visit | Attending: Specialist

## 2019-04-06 DIAGNOSIS — D485 Neoplasm of uncertain behavior of skin: Secondary | ICD-10-CM | POA: Diagnosis not present

## 2019-04-06 DIAGNOSIS — Z79890 Hormone replacement therapy (postmenopausal): Secondary | ICD-10-CM | POA: Insufficient documentation

## 2019-04-06 DIAGNOSIS — M810 Age-related osteoporosis without current pathological fracture: Secondary | ICD-10-CM | POA: Diagnosis not present

## 2019-04-06 DIAGNOSIS — Z79899 Other long term (current) drug therapy: Secondary | ICD-10-CM | POA: Diagnosis not present

## 2019-04-06 DIAGNOSIS — M720 Palmar fascial fibromatosis [Dupuytren]: Secondary | ICD-10-CM | POA: Diagnosis not present

## 2019-04-06 DIAGNOSIS — E039 Hypothyroidism, unspecified: Secondary | ICD-10-CM | POA: Insufficient documentation

## 2019-04-06 DIAGNOSIS — E785 Hyperlipidemia, unspecified: Secondary | ICD-10-CM | POA: Diagnosis not present

## 2019-04-06 DIAGNOSIS — M1990 Unspecified osteoarthritis, unspecified site: Secondary | ICD-10-CM | POA: Diagnosis not present

## 2019-04-06 DIAGNOSIS — Z96611 Presence of right artificial shoulder joint: Secondary | ICD-10-CM | POA: Insufficient documentation

## 2019-04-06 HISTORY — PX: TRIGGER FINGER RELEASE: SHX641

## 2019-04-06 SURGERY — RELEASE, A1 PULLEY, FOR TRIGGER FINGER
Anesthesia: Monitor Anesthesia Care | Site: Hand | Laterality: Left

## 2019-04-06 MED ORDER — ONDANSETRON HCL 4 MG/2ML IJ SOLN: 4.0000 mg | Freq: Once | INTRAMUSCULAR | Status: DC | PRN

## 2019-04-06 MED ORDER — OXYCODONE HCL 5 MG PO TABS: 5.0000 mg | ORAL_TABLET | Freq: Once | ORAL | Status: DC

## 2019-04-06 MED ORDER — FENTANYL CITRATE (PF) 100 MCG/2ML IJ SOLN: 50.0000 ug | INTRAMUSCULAR | Status: DC | PRN

## 2019-04-06 MED ORDER — PROPOFOL 10 MG/ML IV BOLUS
INTRAVENOUS | Status: AC
  Filled 2019-04-06: qty 20

## 2019-04-06 MED ORDER — DEXAMETHASONE SODIUM PHOSPHATE 10 MG/ML IJ SOLN
INTRAMUSCULAR | Status: AC
  Filled 2019-04-06: qty 1

## 2019-04-06 MED ORDER — MIDAZOLAM HCL 2 MG/2ML IJ SOLN
INTRAMUSCULAR | Status: AC
  Filled 2019-04-06: qty 2

## 2019-04-06 MED ORDER — CEFAZOLIN SODIUM-DEXTROSE 2-4 GM/100ML-% IV SOLN
INTRAVENOUS | Status: AC
  Filled 2019-04-06: qty 100

## 2019-04-06 MED ORDER — OXYCODONE HCL 5 MG PO TABS
ORAL_TABLET | ORAL | Status: AC
  Filled 2019-04-06: qty 1

## 2019-04-06 MED ORDER — LIDOCAINE 2% (20 MG/ML) 5 ML SYRINGE
INTRAMUSCULAR | Status: AC
  Filled 2019-04-06: qty 5

## 2019-04-06 MED ORDER — FENTANYL CITRATE (PF) 100 MCG/2ML IJ SOLN
INTRAMUSCULAR | Status: AC
  Filled 2019-04-06: qty 2

## 2019-04-06 MED ORDER — CHLORHEXIDINE GLUCONATE CLOTH 2 % EX PADS: 6.0000 | MEDICATED_PAD | Freq: Once | CUTANEOUS | Status: DC

## 2019-04-06 MED ORDER — MIDAZOLAM HCL 2 MG/2ML IJ SOLN
1.0000 mg | INTRAMUSCULAR | Status: DC | PRN
  Administered 2019-04-06 (×2): 1 mg via INTRAVENOUS

## 2019-04-06 MED ORDER — LACTATED RINGERS IV SOLN
INTRAVENOUS | Status: DC
  Administered 2019-04-06: 08:00:00 via INTRAVENOUS

## 2019-04-06 MED ORDER — ONDANSETRON HCL 4 MG/2ML IJ SOLN
INTRAMUSCULAR | Status: DC | PRN
  Administered 2019-04-06: 4 mg via INTRAVENOUS

## 2019-04-06 MED ORDER — ONDANSETRON HCL 4 MG/2ML IJ SOLN
INTRAMUSCULAR | Status: AC
  Filled 2019-04-06: qty 2

## 2019-04-06 MED ORDER — OXYCODONE HCL 5 MG PO TABS
5.0000 mg | ORAL_TABLET | Freq: Once | ORAL | Status: AC | PRN
  Administered 2019-04-06: 5 mg via ORAL

## 2019-04-06 MED ORDER — PROPOFOL 10 MG/ML IV BOLUS
INTRAVENOUS | Status: DC | PRN
  Administered 2019-04-06: 30 mg via INTRAVENOUS

## 2019-04-06 MED ORDER — PROPOFOL 500 MG/50ML IV EMUL
INTRAVENOUS | Status: DC | PRN
  Administered 2019-04-06: 80 ug/kg/min via INTRAVENOUS

## 2019-04-06 MED ORDER — FENTANYL CITRATE (PF) 100 MCG/2ML IJ SOLN
25.0000 ug | INTRAMUSCULAR | Status: DC | PRN
  Administered 2019-04-06: 10:00:00 25 ug via INTRAVENOUS
  Administered 2019-04-06: 50 ug via INTRAVENOUS
  Administered 2019-04-06 (×2): 25 ug via INTRAVENOUS

## 2019-04-06 MED ORDER — ACETAMINOPHEN 325 MG PO TABS: 325.0000 mg | ORAL_TABLET | ORAL | Status: DC | PRN

## 2019-04-06 MED ORDER — DEXAMETHASONE SODIUM PHOSPHATE 10 MG/ML IJ SOLN
INTRAMUSCULAR | Status: DC | PRN
  Administered 2019-04-06: 10 mg via INTRAVENOUS

## 2019-04-06 MED ORDER — ACETAMINOPHEN 160 MG/5ML PO SOLN: 325.0000 mg | ORAL | Status: DC | PRN

## 2019-04-06 MED ORDER — LIDOCAINE HCL (PF) 0.5 % IJ SOLN
INTRAMUSCULAR | Status: DC | PRN
  Administered 2019-04-06: 35 mL via INTRAVENOUS

## 2019-04-06 MED ORDER — MEPERIDINE HCL 25 MG/ML IJ SOLN: 6.2500 mg | INTRAMUSCULAR | Status: DC | PRN

## 2019-04-06 MED ORDER — CEFAZOLIN SODIUM-DEXTROSE 2-4 GM/100ML-% IV SOLN
2.0000 g | INTRAVENOUS | Status: AC
  Administered 2019-04-06: 2 g via INTRAVENOUS

## 2019-04-06 MED ORDER — OXYCODONE HCL 5 MG/5ML PO SOLN: 5.0000 mg | Freq: Once | ORAL | Status: AC | PRN

## 2019-04-06 SURGICAL SUPPLY — 56 items
APL SKNCLS STERI-STRIP NONHPOA (GAUZE/BANDAGES/DRESSINGS) ×1
BENZOIN TINCTURE PRP APPL 2/3 (GAUZE/BANDAGES/DRESSINGS) ×2 IMPLANT
BLADE KNIFE PERSONA 10 (BLADE) IMPLANT
BLADE KNIFE PERSONA 15 (BLADE) ×3 IMPLANT
BNDG CMPR 9X4 STRL LF SNTH (GAUZE/BANDAGES/DRESSINGS)
BNDG COHESIVE 2X5 TAN STRL LF (GAUZE/BANDAGES/DRESSINGS) IMPLANT
BNDG COHESIVE 3X5 TAN STRL LF (GAUZE/BANDAGES/DRESSINGS) IMPLANT
BNDG CONFORM 3 STRL LF (GAUZE/BANDAGES/DRESSINGS) ×2 IMPLANT
BNDG ELASTIC 2X5.8 VLCR STR LF (GAUZE/BANDAGES/DRESSINGS) IMPLANT
BNDG ELASTIC 3X5.8 VLCR STR LF (GAUZE/BANDAGES/DRESSINGS) IMPLANT
BNDG ESMARK 4X9 LF (GAUZE/BANDAGES/DRESSINGS) IMPLANT
BNDG GAUZE ELAST 4 BULKY (GAUZE/BANDAGES/DRESSINGS) IMPLANT
BRUSH SCRUB EZ PLAIN DRY (MISCELLANEOUS) ×3 IMPLANT
CLEANER CAUTERY TIP 5X5 PAD (MISCELLANEOUS) IMPLANT
COVER BACK TABLE REUSABLE LG (DRAPES) ×3 IMPLANT
COVER MAYO STAND REUSABLE (DRAPES) ×3 IMPLANT
COVER WAND RF STERILE (DRAPES) IMPLANT
DECANTER SPIKE VIAL GLASS SM (MISCELLANEOUS) IMPLANT
DRAPE EXTREMITY T 121X128X90 (DISPOSABLE) ×3 IMPLANT
DRAPE SURG 17X23 STRL (DRAPES) ×3 IMPLANT
ELECT NDL TIP 2.8 STRL (NEEDLE) ×1 IMPLANT
ELECT NEEDLE TIP 2.8 STRL (NEEDLE) ×3 IMPLANT
ELECT REM PT RETURN 9FT ADLT (ELECTROSURGICAL) ×3
ELECTRODE REM PT RTRN 9FT ADLT (ELECTROSURGICAL) ×1 IMPLANT
GAUZE SPONGE 4X4 12PLY STRL (GAUZE/BANDAGES/DRESSINGS) ×3 IMPLANT
GAUZE XEROFORM 1X8 LF (GAUZE/BANDAGES/DRESSINGS) ×3 IMPLANT
GLOVE BIOGEL PI IND STRL 7.0 (GLOVE) IMPLANT
GLOVE BIOGEL PI INDICATOR 7.0 (GLOVE) ×2
GLOVE ECLIPSE 6.5 STRL STRAW (GLOVE) ×2 IMPLANT
GLOVE ECLIPSE 7.0 STRL STRAW (GLOVE) ×3 IMPLANT
GOWN STRL REUS W/ TWL LRG LVL3 (GOWN DISPOSABLE) IMPLANT
GOWN STRL REUS W/ TWL XL LVL3 (GOWN DISPOSABLE) ×1 IMPLANT
GOWN STRL REUS W/TWL LRG LVL3 (GOWN DISPOSABLE)
GOWN STRL REUS W/TWL XL LVL3 (GOWN DISPOSABLE) ×3
MARKER SKIN DUAL TIP RULER LAB (MISCELLANEOUS) ×3 IMPLANT
NDL HYPO 25X1 1.5 SAFETY (NEEDLE) IMPLANT
NEEDLE HYPO 25X1 1.5 SAFETY (NEEDLE) IMPLANT
NS IRRIG 1000ML POUR BTL (IV SOLUTION) IMPLANT
PACK BASIN DAY SURGERY FS (CUSTOM PROCEDURE TRAY) ×3 IMPLANT
PAD CLEANER CAUTERY TIP 5X5 (MISCELLANEOUS)
PADDING CAST ABS 3INX4YD NS (CAST SUPPLIES)
PADDING CAST ABS COTTON 3X4 (CAST SUPPLIES) IMPLANT
PENCIL BUTTON HOLSTER BLD 10FT (ELECTRODE) ×3 IMPLANT
SPLINT UNIVERSAL RIGHT (SOFTGOODS) IMPLANT
SPLINT WRIST 10 LEFT UNV (SOFTGOODS) IMPLANT
STOCKINETTE 4X48 STRL (DRAPES) ×3 IMPLANT
STRIP SUTURE WOUND CLOSURE 1/2 (SUTURE) ×2 IMPLANT
SUT ETHILON 3 0 PS 1 (SUTURE) ×2 IMPLANT
SUT ETHILON 5 0 PS 2 18 (SUTURE) IMPLANT
SUT MNCRL AB 3-0 PS2 18 (SUTURE) ×2 IMPLANT
SUT PROLENE 4 0 PS 2 18 (SUTURE) IMPLANT
SYR BULB 3OZ (MISCELLANEOUS) ×2 IMPLANT
SYR CONTROL 10ML LL (SYRINGE) IMPLANT
TOWEL GREEN STERILE FF (TOWEL DISPOSABLE) ×3 IMPLANT
TRAY DSU PREP LF (CUSTOM PROCEDURE TRAY) ×3 IMPLANT
UNDERPAD 30X36 HEAVY ABSORB (UNDERPADS AND DIAPERS) ×3 IMPLANT

## 2019-04-06 NOTE — Brief Op Note (Signed)
04/06/2019  9:56 AM  PATIENT:  Nancy [name]  83 y.o. female  PRE-OPERATIVE DIAGNOSIS:  DUPUYTREN CONTRACTURE  POST-OPERATIVE DIAGNOSIS:  DUPUYTREN CONTRACTURE  PROCEDURE:  Procedure(s): DUPUYTREN 'S AND CONTRACTURE RELEASE LEFT FOURTH FINGER (Left)  SURGEON:  Surgeon(s) and Role:    * Cristine Polio, MD - Primary  PHYSICIAN ASSISTANT:   ASSISTANTS: none   ANESTHESIA:   IV sedation  EBL:  10 mL   BLOOD ADMINISTERED:none  DRAINS: none   LOCAL MEDICATIONS USED:  LIDOCAINE   SPECIMEN:  Excision  DISPOSITION OF SPECIMEN:  PATHOLOGY  COUNTS:  YES  TOURNIQUET:   Total Tourniquet Time Documented: Forearm (Left) - 36 minutes Total: Forearm (Left) - 36 minutes   DICTATION: .Other Dictation: Dictation Number [id number]  PLAN OF CARE: Discharge to home after PACU  PATIENT DISPOSITION:  PACU - hemodynamically stable.   Delay start of Pharmacological VTE agent (>24hrs) due to surgical blood loss or risk of bleeding: yes

## 2019-04-06 NOTE — Anesthesia Preprocedure Evaluation (Signed)
Anesthesia Evaluation  Patient identified by MRN, date of birth, ID band Patient awake    Reviewed: Allergy & Precautions, NPO status , Patient's Chart, lab work & pertinent test results  History of Anesthesia Complications (+) PONV and history of anesthetic complications  Airway Mallampati: I  TM Distance: >3 FB Neck ROM: Full    Dental  (+) Dental Advisory Given   Pulmonary neg pulmonary ROS,    breath sounds clear to auscultation       Cardiovascular negative cardio ROS   Rhythm:Regular Rate:Normal     Neuro/Psych negative neurological ROS     GI/Hepatic negative GI ROS, Neg liver ROS,   Endo/Other  Hypothyroidism   Renal/GU negative Renal ROS     Musculoskeletal  (+) Arthritis ,   Abdominal   Peds  Hematology negative hematology ROS (+)   Anesthesia Other Findings   Reproductive/Obstetrics                             Anesthesia Physical  Anesthesia Plan  ASA: II  Anesthesia Plan: MAC and Bier Block and Bier Block-LIDOCAINE ONLY   Post-op Pain Management:    Induction: Intravenous  PONV Risk Score and Plan: 4 or greater and Treatment may vary due to age or medical condition, Propofol infusion and Midazolam  Airway Management Planned: Nasal Cannula, Simple Face Mask and Mask  Additional Equipment:   Intra-op Plan:   Post-operative Plan: Extubation in OR  Informed Consent: I have reviewed the patients History and Physical, chart, labs and discussed the procedure including the risks, benefits and alternatives for the proposed anesthesia with the patient or authorized representative who has indicated his/her understanding and acceptance.     Dental advisory given  Plan Discussed with: CRNA, Surgeon and Anesthesiologist  Anesthesia Plan Comments:         Anesthesia Quick Evaluation

## 2019-04-06 NOTE — Discharge Instructions (Signed)
Activity (include date of return to work if known) °As tolerated: NO showers °NO driving °No heavy activities ° °Diet:regular No restrictions: ° °Wound Care: Keep dressing clean & dry ° °Do not change dressings °For Abdominoplasties wear abdominal binder °Special Instructions: °Do not raise arms up °Continue to empty, recharge, & record drainage from J-P drains &/or °Hemovacs 2-3 times a day, as needed. °Call Doctor if any unusual problems occur such as pain, excessive °Bleeding, unrelieved Nausea/vomiting, Fever &/or chills °When lying down, keep head elevated on 2-3 pillows or back-rest °For Addominoplasties the Jack-knife position °Follow-up appointment: Please call the office. ° °The patient received discharge instruction from:___________________________________________ ° ° °Patient signature ________________________________________ / Date___________ ° ° ° °Signature of individual providing instructions/ Date________________             ° ° ° ° ° ° °Post Anesthesia Home Care Instructions ° °Activity: °Get plenty of rest for the remainder of the day. A responsible individual must stay with you for 24 hours following the procedure.  °For the next 24 hours, DO NOT: °-Drive a car °-Operate machinery °-Drink alcoholic beverages °-Take any medication unless instructed by your physician °-Make any legal decisions or sign important papers. ° °Meals: °Start with liquid foods such as gelatin or soup. Progress to regular foods as tolerated. Avoid greasy, spicy, heavy foods. If nausea and/or vomiting occur, drink only clear liquids until the nausea and/or vomiting subsides. Call your physician if vomiting continues. ° °Special Instructions/Symptoms: °Your throat may feel dry or sore from the anesthesia or the breathing tube placed in your throat during surgery. If this causes discomfort, gargle with warm salt water. The discomfort should disappear within 24 hours. ° °If you had a scopolamine patch placed behind your ear for  the management of post- operative nausea and/or vomiting: ° °1. The medication in the patch is effective for 72 hours, after which it should be removed.  Wrap patch in a tissue and discard in the trash. Wash hands thoroughly with soap and water. °2. You may remove the patch earlier than 72 hours if you experience unpleasant side effects which may include dry mouth, dizziness or visual disturbances. °3. Avoid touching the patch. Wash your hands with soap and water after contact with the patch. °  ° °

## 2019-04-06 NOTE — H&P (Signed)
Nancy [name] is an 83 y.o. female.   Chief Complaint: Dupretren,s contracture left 4th finger HPI: Decreased range of motion and pain  Past Medical History:  Diagnosis Date  . Allergy   . Avascular necrosis (HCC)    RIGHT SHOULDER  . Chronic cough    saw several specialists per her report, NONE NOW  . Closed 4-part fracture of proximal end of right humerus, with malunion, subsequent encounter with AVN 03/18/2018  . Hypothyroidism   . Osteoporosis    Sees Dr. Layne Benton at [place], on prolia  . PONV (postoperative nausea and vomiting)    severe post op nausea and vomiting    Past Surgical History:  Procedure Laterality Date  . ABDOMINAL HYSTERECTOMY  1975   PARTIAL  . APPENDECTOMY    . LAPAROSCOPY     removal of ovaries  . OOPHORECTOMY Bilateral 2007  . removal growth thyroid  1998  . REVERSE SHOULDER ARTHROPLASTY Right 03/18/2018   Procedure: REVERSE RIGHT SHOULDER ARTHROPLASTY;  Surgeon: Marchia Bond, MD;  Location: [facility];  Service: Orthopedics;  Laterality: Right;  . TONSILLECTOMY      Family History  Problem Relation Age of Onset  . Dementia Mother   . Colon cancer Mother 76  . Parkinsonism Father   . Colon cancer Sister 75  . Osteoporosis Other   . Stomach cancer Neg Hx    Social History:  reports that she has never smoked. She has never used smokeless tobacco. She reports that she does not drink alcohol or use drugs.  Allergies:  Allergies  Allergen Reactions  . Norco [Hydrocodone-Acetaminophen] Hives  . Penicillins Hives    Has patient had a PCN reaction causing immediate rash, facial/tongue/throat swelling, SOB or lightheadedness with hypotension: yes Has patient had a PCN reaction causing severe rash involving mucus membranes or skin necrosis: no Has patient had a PCN reaction that required hospitalization: no Has patient had a PCN reaction occurring within the last 10 years: yes If all of the above answers are "NO", then may proceed with  Cephalosporin use.   . Sulfamethoxazole Hives    Medications Prior to Admission  Medication Sig Dispense Refill  . cholecalciferol (VITAMIN D3) 25 MCG (1000 UT) tablet Take 1,000 Units by mouth daily.    Marland Kitchen levothyroxine (SYNTHROID) 50 MCG tablet Take 1 tablet (50 mcg total) by mouth daily. 90 tablet 1    No results found for this or any previous visit (from the past 48 hour(s)). No results found.  Review of Systems  Constitutional: Negative.   HENT: Negative.   Eyes: Negative.   Respiratory: Negative.   Cardiovascular: Negative.   Gastrointestinal: Negative.   Genitourinary: Negative.   Musculoskeletal: Negative.   Skin: Negative.   Neurological: Negative.   Endo/Heme/Allergies: Negative.   Psychiatric/Behavioral: Negative.     Blood pressure (!) 113/58, pulse 89, temperature 98.3 F (36.8 C), temperature source Oral, height 5' 5.5" (1.664 m), weight 72.7 kg, SpO2 100 %. Physical Exam  Constitutional: She appears well-developed and well-nourished.  HENT:  Head: Normocephalic.  Nose: Nose normal.  Eyes: Pupils are equal, round, and reactive to light. Conjunctivae and EOM are normal.  Neck: Normal range of motion. Neck supple. No tracheal deviation present. No thyromegaly present.  Cardiovascular: Normal rate and regular rhythm.  Respiratory: Effort normal and breath sounds normal.  GI: Soft. Bowel sounds are normal. She exhibits no distension. There is no abdominal tenderness. There is no rebound.  Musculoskeletal: Normal range of motion.  Lymphadenopathy:    She has no cervical adenopathy.  Neurological: She is alert.  Skin: Skin is warm.  Psychiatric: She has a normal mood and affect.     Assessment/Plan Duypretrens contracture left 4th finger for total release ans flap closure  Ragnar Waas L, MD 04/06/2019, 9:48 AM

## 2019-04-06 NOTE — Anesthesia Postprocedure Evaluation (Signed)
Anesthesia Post Note  Patient: Nancy [name]  Procedure(s) Performed: DUPUYTREN 'S AND CONTRACTURE RELEASE LEFT FOURTH FINGER (Left Hand)     Patient location during evaluation: PACU Anesthesia Type: MAC Level of consciousness: awake and alert Pain management: pain level controlled Vital Signs Assessment: post-procedure vital signs reviewed and stable Respiratory status: spontaneous breathing, nonlabored ventilation, respiratory function stable and patient connected to nasal cannula oxygen Cardiovascular status: stable and blood pressure returned to baseline Postop Assessment: no apparent nausea or vomiting Anesthetic complications: no    Last Vitals:  Vitals:   04/06/19 1045 04/06/19 1130  BP: 120/73 121/78  Pulse: 61 65  Resp: (!) 9 14  Temp:  36.7 C  SpO2: 97% 99%    Last Pain:  Vitals:   04/06/19 1130  TempSrc:   PainSc: 3                  Carvin Almas

## 2019-04-06 NOTE — Op Note (Signed)
NAME: Nancy [name], Nancy [name] MEDICAL RECORD [id number] ACCOUNT [id number] DATE OF BIRTH:09-22-1935 FACILITY: [facility] LOCATION: [facility] PHYSICIAN:Abdulkareem Badolato Octavia Heir, MD  OPERATIVE REPORT  DATE OF PROCEDURE:  04/06/2019  INDICATIONS:  This 83 year old lady has severe Dupuytren's contracture involving her left palmar surface; specifically, the 4th finger, increased pain, discomfort, and deformity, decreased range of motion.  PROCEDURE:  Release of severe scarring of Dupuytren's, removal and flap closure.  ANESTHESIA:  Bier block supplemented by sedation.  PROCEDURE IN DETAIL:  After Bier block anesthesia had been placed, prep was done to the left hand and arm areas with Hibiclens soap and solution and walled off with sterile towels and drapes so as to make a sterile field.  The wounds were drawn again  with a marking pen, and then with a #15 blade, I was able to make an incision in all the areas from just proximal to the wrist, all the way down past the first IP joint.  We dissected it out with a blunt knife, and we were able to remove a lot of the  Dupuytren's contracture.  Several areas made the skin so thin that closure would not be justifiable, so that was removed surgically with all the skin.  Then I was able to free up the flaps with the Bovie on low out of approximately 1 cm on each side to  allow closure.  With the flap closure, deep sutures of 3-0 and 4-0 Monocryl.  Then, a running 3-0 nylon.  Steri-Strips and a soft dressing were applied to all the areas, and the tourniquet was released, showing excellent blood supply to all the digits.   A hand wrap was placed, 4 x 4's, ABDs, Hypafix tape, and a Kerlix wrap.  She withstood the procedures very well and was taken to recovery in excellent condition.  LN/NUANCE  D:04/06/2019 T:04/06/2019 JOB:[id number]

## 2019-04-06 NOTE — Transfer of Care (Signed)
Immediate Anesthesia Transfer of Care Note  Patient: Nancy [name]  Procedure(s) Performed: DUPUYTREN 'S AND CONTRACTURE RELEASE LEFT FOURTH FINGER (Left Hand)  Patient Location: PACU  Anesthesia Type:Bier block  Level of Consciousness: awake, alert  and oriented  Airway & Oxygen Therapy: Patient Spontanous Breathing and Patient connected to face mask oxygen  Post-op Assessment: Report given to RN and Post -op Vital signs reviewed and stable  Post vital signs: Reviewed and stable  Last Vitals:  Vitals Value Taken Time  BP 111/67 04/06/19 0946  Temp    Pulse 90 04/06/19 0948  Resp 20 04/06/19 0948  SpO2 100 % 04/06/19 0948  Vitals shown include unvalidated device data.  Last Pain:  Vitals:   04/06/19 0753  TempSrc: Oral  PainSc: 0-No pain         Complications: No apparent anesthesia complications

## 2019-04-07 LAB — SURGICAL PATHOLOGY

## 2019-04-08 ENCOUNTER — Encounter (HOSPITAL_BASED_OUTPATIENT_CLINIC_OR_DEPARTMENT_OTHER): Payer: Self-pay | Admitting: Specialist

## 2019-05-12 ENCOUNTER — Other Ambulatory Visit: Payer: Self-pay | Admitting: Family Medicine

## 2019-05-25 DIAGNOSIS — R5383 Other fatigue: Secondary | ICD-10-CM | POA: Diagnosis not present

## 2019-05-25 DIAGNOSIS — M810 Age-related osteoporosis without current pathological fracture: Secondary | ICD-10-CM | POA: Diagnosis not present

## 2019-05-25 DIAGNOSIS — E559 Vitamin D deficiency, unspecified: Secondary | ICD-10-CM | POA: Diagnosis not present

## 2019-08-26 DIAGNOSIS — H2513 Age-related nuclear cataract, bilateral: Secondary | ICD-10-CM | POA: Diagnosis not present

## 2019-08-26 DIAGNOSIS — H5203 Hypermetropia, bilateral: Secondary | ICD-10-CM | POA: Diagnosis not present

## 2019-09-07 ENCOUNTER — Ambulatory Visit: Payer: Medicare Other | Attending: Internal Medicine

## 2019-09-07 DIAGNOSIS — Z23 Encounter for immunization: Secondary | ICD-10-CM

## 2019-09-07 NOTE — Progress Notes (Signed)
   Covid-19 Vaccination Clinic  Name:  Nancy [name]    MRN: [id number] DOB: 01-Mar-1936  09/07/2019  Nancy [name] was observed post Covid-19 immunization for 15 minutes without incident. She was provided with Vaccine Information Sheet and instruction to access the V-Safe system.   Nancy [name] was instructed to call 911 with any severe reactions post vaccine: Marland Kitchen Difficulty breathing  . Swelling of face and throat  . A fast heartbeat  . A bad rash all over body  . Dizziness and weakness   Immunizations Administered    Name Date Dose VIS Date Route   Pfizer COVID-19 Vaccine 09/07/2019  8:07 AM 0.3 mL 05/29/2019 Intramuscular   Manufacturer: [place]   Lot: [id number]   [place]: [id number]

## 2019-09-10 ENCOUNTER — Other Ambulatory Visit: Payer: Self-pay

## 2019-09-11 ENCOUNTER — Encounter: Payer: Self-pay | Admitting: Family Medicine

## 2019-09-11 ENCOUNTER — Ambulatory Visit: Payer: Medicare Other

## 2019-09-11 ENCOUNTER — Telehealth: Payer: Self-pay

## 2019-09-11 ENCOUNTER — Ambulatory Visit (INDEPENDENT_AMBULATORY_CARE_PROVIDER_SITE_OTHER): Payer: Medicare Other | Admitting: Family Medicine

## 2019-09-11 VITALS — BP 102/64 | HR 88 | Temp 97.8°F | Ht 65.5 in | Wt 165.7 lb

## 2019-09-11 DIAGNOSIS — M810 Age-related osteoporosis without current pathological fracture: Secondary | ICD-10-CM | POA: Diagnosis not present

## 2019-09-11 DIAGNOSIS — E039 Hypothyroidism, unspecified: Secondary | ICD-10-CM

## 2019-09-11 DIAGNOSIS — D649 Anemia, unspecified: Secondary | ICD-10-CM

## 2019-09-11 LAB — COMPREHENSIVE METABOLIC PANEL
ALT: 7 U/L (ref 0–35)
AST: 16 U/L (ref 0–37)
Albumin: 4.5 g/dL (ref 3.5–5.2)
Alkaline Phosphatase: 32 U/L — ABNORMAL LOW (ref 39–117)
BUN: 23 mg/dL (ref 6–23)
CO2: 26 mEq/L (ref 19–32)
Calcium: 9.3 mg/dL (ref 8.4–10.5)
Chloride: 108 mEq/L (ref 96–112)
Creatinine, Ser: 0.99 mg/dL (ref 0.40–1.20)
GFR: 53.72 mL/min — ABNORMAL LOW (ref >60.00)
Glucose, Bld: 99 mg/dL (ref 70–99)
Potassium: 5.2 mEq/L — ABNORMAL HIGH (ref 3.5–5.1)
Sodium: 140 mEq/L (ref 135–145)
Total Bilirubin: 0.5 mg/dL (ref 0.2–1.2)
Total Protein: 6.9 g/dL (ref 6.0–8.3)

## 2019-09-11 LAB — CBC WITH DIFFERENTIAL/PLATELET
Basophils Absolute: 0 10*3/uL (ref 0.0–0.1)
Basophils Relative: 0.7 % (ref 0.0–3.0)
Eosinophils Absolute: 0.3 10*3/uL (ref 0.0–0.7)
Eosinophils Relative: 4.5 % (ref 0.0–5.0)
HCT: 36.5 % (ref 36.0–46.0)
Hemoglobin: 12.2 g/dL (ref 12.0–15.0)
Lymphocytes Relative: 30.3 % (ref 12.0–46.0)
Lymphs Abs: 2.1 10*3/uL (ref 0.7–4.0)
MCHC: 33.5 g/dL (ref 30.0–36.0)
MCV: 93 fl (ref 78.0–100.0)
Monocytes Absolute: 0.6 10*3/uL (ref 0.1–1.0)
Monocytes Relative: 8.4 % (ref 3.0–12.0)
Neutro Abs: 3.9 10*3/uL (ref 1.4–7.7)
Neutrophils Relative %: 56.1 % (ref 43.0–77.0)
Platelets: 258 10*3/uL (ref 150.0–400.0)
RBC: 3.92 Mil/uL (ref 3.87–5.11)
RDW: 13.6 % (ref 11.5–15.5)
WBC: 7 10*3/uL (ref 4.0–10.5)

## 2019-09-11 LAB — TSH: TSH: 2.17 u[IU]/mL (ref 0.35–4.50)

## 2019-09-11 LAB — VITAMIN D 25 HYDROXY (VIT D DEFICIENCY, FRACTURES): VITD: 60.52 ng/mL (ref 30.00–100.00)

## 2019-09-11 NOTE — Addendum Note (Signed)
Addended by: Suzette Battiest on: 09/11/2019 10:14 AM   Modules accepted: Orders

## 2019-09-11 NOTE — Telephone Encounter (Signed)
Left message for pt to call back  °

## 2019-09-11 NOTE — Telephone Encounter (Signed)
Pt scheduled for previsit 4/28@11am , colon scheduled in the [facility] 10/22/19@11 :30am. Pt aware of appts.

## 2019-09-11 NOTE — Telephone Encounter (Signed)
-----   Message from Irene Shipper, MD sent at 09/11/2019 10:50 AM EDT ----- Regarding: colon in [facility], See below. Place in phone note for me to refer later. Set up colon in [place]. Can be direct. Thanks  JP ----- Message ----- From: Caren Macadam, MD Sent: 09/11/2019   9:48 AM EDT To: Irene Shipper, MD  Patient interested in having repeat colonoscopy due to family history of colon cancer (her sister and mom). She is in very good health currently. Could you call to set up discussion about screening options for her? Thanks!

## 2019-09-11 NOTE — Progress Notes (Signed)
Nancy [name] DOB: 04-12-1936 Encounter date: 09/11/2019  This is a 84 y.o. female who presents to establish care. Chief Complaint  Patient presents with  . Establish Care    History of present illness:  Osteoporosis: prolia. Managed by Dr. Layne Benton. Has been on this for 4 years. Had improvement in bone density since starting this.   Last visit with [facility] was for elevated transaminases: these were normal after holding supplements, tylenol (last checked 02/2019)  Hypothyroid: on synthroid 66mcg daily  Takes the claritin regularly for allergies. No significant congestion this season.   Usually likes to travel with sister. Takes two big trips a year.  Last gyn visit was 2 years ago. Due for repeat visit with Dr. Philis Pique. Does not follow with dermatology.   Past Medical History:  Diagnosis Date  . Allergy   . Avascular necrosis (HCC)    RIGHT SHOULDER  . Chronic cough    saw several specialists per her report, NONE NOW  . Closed 4-part fracture of proximal end of right humerus, with malunion, subsequent encounter with AVN 03/18/2018  . Hypothyroidism   . Osteoporosis    Sees Dr. Layne Benton at [place], on prolia  . PONV (postoperative nausea and vomiting)    severe post op nausea and vomiting   Past Surgical History:  Procedure Laterality Date  . ABDOMINAL HYSTERECTOMY  1975   PARTIAL; had ovarian cysts. no cancer concerns.  . APPENDECTOMY    . LAPAROSCOPY     removal of ovaries  . OOPHORECTOMY Bilateral 2007  . removal growth thyroid  1998  . REVERSE SHOULDER ARTHROPLASTY Right 03/18/2018   Procedure: REVERSE RIGHT SHOULDER ARTHROPLASTY;  Surgeon: Marchia Bond, MD;  Location: [facility];  Service: Orthopedics;  Laterality: Right;  . TONSILLECTOMY    . TRIGGER FINGER RELEASE Left 04/06/2019   Procedure: DUPUYTREN 'S AND CONTRACTURE RELEASE LEFT FOURTH FINGER;  Surgeon: Cristine Polio, MD;  Location: [place];  Service: Plastics;  Laterality: Left;    Allergies  Allergen Reactions  . Norco [Hydrocodone-Acetaminophen] Hives  . Penicillins Hives    Has patient had a PCN reaction causing immediate rash, facial/tongue/throat swelling, SOB or lightheadedness with hypotension: yes Has patient had a PCN reaction causing severe rash involving mucus membranes or skin necrosis: no Has patient had a PCN reaction that required hospitalization: no Has patient had a PCN reaction occurring within the last 10 years: yes If all of the above answers are "NO", then may proceed with Cephalosporin use.   . Sulfamethoxazole Hives   Current Meds  Medication Sig  . cholecalciferol (VITAMIN D3) 25 MCG (1000 UT) tablet Take 2,000 Units by mouth daily.   Marland Kitchen loratadine (CLARITIN) 10 MG tablet Take 10 mg by mouth daily.  . Multiple Vitamins-Minerals (ZINC PO) Take by mouth.  . SYNTHROID 50 MCG tablet TAKE 1 TABLET BY MOUTH  DAILY   Social History   Tobacco Use  . Smoking status: Never Smoker  . Smokeless tobacco: Never Used  Substance Use Topics  . Alcohol use: Never   Family History  Problem Relation Age of Onset  . Dementia Mother   . Colon cancer Mother 31  . Parkinsonism Father   . Hypertension Father   . Colon cancer Sister 75  . Hypertension Sister   . Stroke Sister   . Osteoporosis Other   . Stomach cancer Neg Hx      Review of Systems  Constitutional: Negative for chills, fatigue and fever.  Respiratory: Negative  for cough, chest tightness, shortness of breath and wheezing.   Cardiovascular: Negative for chest pain, palpitations and leg swelling.    Objective:  BP 102/64 (BP Location: Left Arm, Patient Position: Sitting, Cuff Size: Large)   Pulse 88   Temp 97.8 F (36.6 C) (Temporal)   Ht 5' 5.5" (1.664 m)   Wt 165 lb 11.2 oz (75.2 kg)   BMI 27.15 kg/m   Weight: 165 lb 11.2 oz (75.2 kg)   BP Readings from Last 3 Encounters:  09/11/19 102/64  04/06/19 121/78  03/20/18 117/70   Wt Readings from Last 3 Encounters:   09/11/19 165 lb 11.2 oz (75.2 kg)  04/06/19 160 lb 4.4 oz (72.7 kg)  03/18/18 153 lb (69.4 kg)    Physical Exam Constitutional:      General: She is not in acute distress.    Appearance: She is well-developed.  Cardiovascular:     Rate and Rhythm: Normal rate and regular rhythm.     Heart sounds: Normal heart sounds. No murmur. No friction rub.  Pulmonary:     Effort: Pulmonary effort is normal. No respiratory distress.     Breath sounds: Normal breath sounds. No wheezing or rales.  Musculoskeletal:     Right lower leg: No edema.     Left lower leg: No edema.  Neurological:     Mental Status: She is alert and oriented to person, place, and time.  Psychiatric:        Behavior: Behavior normal.     Assessment/Plan:  1. Hypothyroidism, unspecified type Will recheck labwork today.   2. Osteoporosis, unspecified osteoporosis type, unspecified pathological fracture presence Follows with Dr. Layne Benton. On prolia. Will check Vitamin D today.  3. History of anemia - will recheck for baseline.  Return in about 6 months (around 03/13/2020) for Chronic condition visit.  Micheline Rough, MD

## 2019-09-15 ENCOUNTER — Telehealth: Payer: Self-pay | Admitting: Family Medicine

## 2019-09-15 NOTE — Telephone Encounter (Signed)
Pt returned call about lab results, would like a call back.

## 2019-09-15 NOTE — Telephone Encounter (Signed)
See results note. 

## 2019-09-30 ENCOUNTER — Ambulatory Visit: Payer: Medicare Other | Attending: Internal Medicine

## 2019-09-30 DIAGNOSIS — Z23 Encounter for immunization: Secondary | ICD-10-CM

## 2019-09-30 NOTE — Progress Notes (Signed)
   Covid-19 Vaccination Clinic  Name:  Nancy [name]    MRN: [id number] DOB: 02-Feb-1936  09/30/2019  [name] was observed post Covid-19 immunization for 30 minutes based on pre-vaccination screening without incident. She was provided with Vaccine Information Sheet and instruction to access the V-Safe system.   Ms. Guard was instructed to call 911 with any severe reactions post vaccine: Marland Kitchen Difficulty breathing  . Swelling of face and throat  . A fast heartbeat  . A bad rash all over body  . Dizziness and weakness   Immunizations Administered    Name Date Dose VIS Date Route   Pfizer COVID-19 Vaccine 09/30/2019  9:06 AM 0.3 mL 05/29/2019 Intramuscular   Manufacturer: [place]   Lot: [id number]   [place]: [id number]

## 2019-10-14 ENCOUNTER — Other Ambulatory Visit: Payer: Self-pay

## 2019-10-14 ENCOUNTER — Ambulatory Visit (AMBULATORY_SURGERY_CENTER): Payer: Self-pay

## 2019-10-14 VITALS — Temp 97.3°F | Ht 65.5 in | Wt 166.8 lb

## 2019-10-14 DIAGNOSIS — Z800 Family history of malignant neoplasm of digestive organs: Secondary | ICD-10-CM

## 2019-10-14 DIAGNOSIS — Z1211 Encounter for screening for malignant neoplasm of colon: Secondary | ICD-10-CM

## 2019-10-14 MED ORDER — NA SULFATE-K SULFATE-MG SULF 17.5-3.13-1.6 GM/177ML PO SOLN: 1.0000 | Freq: Once | ORAL | 0 refills | Status: AC

## 2019-10-14 NOTE — Progress Notes (Signed)
No allergies to soy or egg Pt is not on blood thinners or diet pills Denies issues with sedation/intubation Denies atrial flutter/fib Denies constipation   Emmi instructions given to pt  Pt is aware of Covid safety and care partner requirements.  

## 2019-10-16 ENCOUNTER — Encounter: Payer: Medicare Other | Admitting: Internal Medicine

## 2019-10-22 ENCOUNTER — Other Ambulatory Visit: Payer: Self-pay

## 2019-10-22 ENCOUNTER — Ambulatory Visit (AMBULATORY_SURGERY_CENTER): Payer: Medicare Other | Admitting: Internal Medicine

## 2019-10-22 ENCOUNTER — Encounter: Payer: Self-pay | Admitting: Internal Medicine

## 2019-10-22 VITALS — BP 116/60 | HR 78 | Temp 96.6°F | Resp 13 | Ht 65.5 in | Wt 166.8 lb

## 2019-10-22 DIAGNOSIS — Z86010 Personal history of colonic polyps: Secondary | ICD-10-CM | POA: Diagnosis not present

## 2019-10-22 DIAGNOSIS — Z1211 Encounter for screening for malignant neoplasm of colon: Secondary | ICD-10-CM | POA: Diagnosis not present

## 2019-10-22 DIAGNOSIS — Z800 Family history of malignant neoplasm of digestive organs: Secondary | ICD-10-CM

## 2019-10-22 DIAGNOSIS — D128 Benign neoplasm of rectum: Secondary | ICD-10-CM

## 2019-10-22 MED ORDER — SODIUM CHLORIDE 0.9 % IV SOLN: 500.0000 mL | Freq: Once | INTRAVENOUS | Status: AC

## 2019-10-22 NOTE — Progress Notes (Signed)
Report to PACU, RN, vss, BBS= Clear.  

## 2019-10-22 NOTE — Progress Notes (Signed)
Pt's states no medical or surgical changes since previsit or office visit. 

## 2019-10-22 NOTE — Op Note (Signed)
[place] Patient Name: Nancy [name] Procedure Date: 10/22/2019 11:17 AM MRN: [id number] Endoscopist: Docia Chuck. Henrene Pastor , MD Age: 84 Referring MD:  Date of Birth: 09-Aug-1935 Gender: Female Account #: [id number] Procedure:                Colonoscopy with cold snare polypectomy x 1 Indications:              High risk colon cancer surveillance: Personal                            history of non-advanced adenomas. His examinations                            2005, 2008, 2014. Also mother and sister with a                            history of colon cancer greater than age 33 Medicines:                Monitored Anesthesia Care Procedure:                Pre-Anesthesia Assessment:                           - Prior to the procedure, a History and Physical                            was performed, and patient medications and                            allergies were reviewed. The patient's tolerance of                            previous anesthesia was also reviewed. The risks                            and benefits of the procedure and the sedation                            options and risks were discussed with the patient.                            All questions were answered, and informed consent                            was obtained. Prior Anticoagulants: The patient has                            taken no previous anticoagulant or antiplatelet                            agents. ASA Grade Assessment: II - A patient with                            mild systemic disease. After reviewing the risks  and benefits, the patient was deemed in                            satisfactory condition to undergo the procedure.                           After obtaining informed consent, the colonoscope                            was passed under direct vision. Throughout the                            procedure, the patient's blood pressure, pulse, and                             oxygen saturations were monitored continuously. The                            Colonoscope was introduced through the anus and                            advanced to the the cecum, identified by                            appendiceal orifice and ileocecal valve. The                            ileocecal valve, appendiceal orifice, and rectum                            were photographed. The quality of the bowel                            preparation was excellent. The colonoscopy was                            performed without difficulty. The patient tolerated                            the procedure well. The bowel preparation used was                            SUPREP via split dose instruction. Scope In: 11:31:24 AM Scope Out: 11:47:27 AM Scope Withdrawal Time: 0 hours 12 minutes 36 seconds  Total Procedure Duration: 0 hours 16 minutes 3 seconds  Findings:                 A 3 mm polyp was found in the rectum. The polyp was                            removed with a cold snare. Resection and retrieval                            were complete.  There were several diverticula in the sigmoid colon                            internal hemorrhoids were found during                            retroflexion. The hemorrhoids were small.                           The exam was otherwise without abnormality on                            direct and retroflexion views. Complications:            No immediate complications. Estimated blood loss:                            None. Estimated Blood Loss:     Estimated blood loss: none. Impression:               - One 3 mm polyp in the rectum, removed with a cold                            snare. Resected and retrieved.                           - Internal hemorrhoids. Sigmoid diverticulosis.                           - The examination was otherwise normal on direct                            and retroflexion  views. Recommendation:           - Repeat colonoscopy is not recommended for                            surveillance.                           - Patient has a contact number available for                            emergencies. The signs and symptoms of potential                            delayed complications were discussed with the                            patient. Return to normal activities tomorrow.                            Written discharge instructions were provided to the                            patient.                           -  Resume previous diet.                           - Continue present medications.                           - Await pathology results. Docia Chuck. Henrene Pastor, MD 10/22/2019 11:54:05 AM This report has been signed electronically.

## 2019-10-22 NOTE — Patient Instructions (Signed)
HANDOUTS PROVIDED ON: POLYPS, DIVERTICULOSIS, & HEMORRHOIDS  The polyp removed today have been sent for pathology.  The results can take 1-3 weeks to receive.    You may resume your previous diet and medication schedule.  Thank you for allowing us to care for you today!!!   YOU HAD AN ENDOSCOPIC PROCEDURE TODAY AT THE [facility]:   Refer to the procedure report that was given to you for any specific questions about what was found during the examination.  If the procedure report does not answer your questions, please call your gastroenterologist to clarify.  If you requested that your care partner not be given the details of your procedure findings, then the procedure report has been included in a sealed envelope for you to review at your convenience later.  YOU SHOULD EXPECT: Some feelings of bloating in the abdomen. Passage of more gas than usual.  Walking can help get rid of the air that was put into your GI tract during the procedure and reduce the bloating. If you had a lower endoscopy (such as a colonoscopy or flexible sigmoidoscopy) you may notice spotting of blood in your stool or on the toilet paper. If you underwent a bowel prep for your procedure, you may not have a normal bowel movement for a few days.  Please Note:  You might notice some irritation and congestion in your nose or some drainage.  This is from the oxygen used during your procedure.  There is no need for concern and it should clear up in a day or so.  SYMPTOMS TO REPORT IMMEDIATELY:   Following lower endoscopy (colonoscopy or flexible sigmoidoscopy):  Excessive amounts of blood in the stool  Significant tenderness or worsening of abdominal pains  Swelling of the abdomen that is new, acute  Fever of 100F or higher  For urgent or emergent issues, a gastroenterologist can be reached at any hour by calling [phone]. Do not use MyChart messaging for urgent concerns.    DIET:  We do recommend a  small meal at first, but then you may proceed to your regular diet.  Drink plenty of fluids but you should avoid alcoholic beverages for 24 hours.  ACTIVITY:  You should plan to take it easy for the rest of today and you should NOT DRIVE or use heavy machinery until tomorrow (because of the sedation medicines used during the test).    FOLLOW UP: Our staff will call the number listed on your records 48-72 hours following your procedure to check on you and address any questions or concerns that you may have regarding the information given to you following your procedure. If we do not reach you, we will leave a message.  We will attempt to reach you two times.  During this call, we will ask if you have developed any symptoms of COVID 19. If you develop any symptoms (ie: fever, flu-like symptoms, shortness of breath, cough etc.) before then, please call [phone].  If you test positive for Covid 19 in the 2 weeks post procedure, please call and report this information to us.    If any biopsies were taken you will be contacted by phone or by letter within the next 1-3 weeks.  Please call us at [phone] if you have not heard about the biopsies in 3 weeks.    SIGNATURES/CONFIDENTIALITY: You and/or your care partner have signed paperwork which will be entered into your electronic medical record.  These signatures attest to the fact   that the information above on your After Visit Summary has been reviewed and is understood.  Full responsibility of the confidentiality of this discharge information lies with you and/or your care-partner.

## 2019-10-22 NOTE — Progress Notes (Signed)
Called to room to assist during endoscopic procedure.  Patient ID and intended procedure confirmed with present staff. Received instructions for my participation in the procedure from the performing physician.  

## 2019-10-22 NOTE — Progress Notes (Signed)
Temperature taken by J.B., VS taken by D.T. 

## 2019-10-26 ENCOUNTER — Telehealth: Payer: Self-pay

## 2019-10-26 NOTE — Telephone Encounter (Signed)
  Follow up Call-  Call back number 10/22/2019  Post procedure Call Back phone  # [phone]  Permission to leave phone message Yes  Some recent data might be hidden     Patient questions:  Do you have a fever, pain , or abdominal swelling? No. Pain Score  0 *  Have you tolerated food without any problems? Yes.    Have you been able to return to your normal activities? Yes.    Do you have any questions about your discharge instructions: Diet   No. Medications  No. Follow up visit  No.  Do you have questions or concerns about your Care? No.  Actions: * If pain score is 4 or above: 1. No action needed, pain <4.Have you developed a fever since your procedure? no  2.   Have you had an respiratory symptoms (SOB or cough) since your procedure? no  3.   Have you tested positive for COVID 19 since your procedure no  4.   Have you had any family members/close contacts diagnosed with the COVID 19 since your procedure?  no   If yes to any of these questions please route to Joylene John, RN and Erenest Rasher, RN

## 2019-10-27 ENCOUNTER — Encounter: Payer: Self-pay | Admitting: Internal Medicine

## 2019-11-13 ENCOUNTER — Other Ambulatory Visit: Payer: Self-pay | Admitting: Family Medicine

## 2019-12-02 DIAGNOSIS — M810 Age-related osteoporosis without current pathological fracture: Secondary | ICD-10-CM | POA: Diagnosis not present

## 2019-12-02 DIAGNOSIS — E559 Vitamin D deficiency, unspecified: Secondary | ICD-10-CM | POA: Diagnosis not present

## 2019-12-02 DIAGNOSIS — R5383 Other fatigue: Secondary | ICD-10-CM | POA: Diagnosis not present

## 2019-12-02 LAB — CBC AND DIFFERENTIAL
HCT: 35 — AB (ref 36–46)
Hemoglobin: 11.5 — AB (ref 12.0–16.0)
Platelets: 226 (ref 150–399)
WBC: 6.1

## 2019-12-02 LAB — BASIC METABOLIC PANEL
BUN: 28 — AB (ref 4–21)
CO2: 1 — AB (ref 13–22)
Chloride: 107 (ref 99–108)
Creatinine: 1.1 (ref <=1.1)
Glucose: 76
Potassium: 4.5 (ref 3.4–5.3)
Sodium: 139 (ref 137–147)

## 2019-12-02 LAB — CBC: RBC: 3.7 — AB (ref 3.87–5.11)

## 2019-12-02 LAB — COMPREHENSIVE METABOLIC PANEL
Albumin: 4.2 (ref 3.5–5.0)
Calcium: 9.4 (ref 8.7–10.7)
Globulin: 2.5

## 2019-12-02 LAB — HEPATIC FUNCTION PANEL
ALT: 6 — AB (ref 7–35)
AST: 13 (ref 13–35)
Alkaline Phosphatase: 28 (ref 25–125)

## 2019-12-02 LAB — VITAMIN D 25 HYDROXY (VIT D DEFICIENCY, FRACTURES): Vit D, 25-Hydroxy: 43

## 2019-12-22 DIAGNOSIS — M25511 Pain in right shoulder: Secondary | ICD-10-CM | POA: Diagnosis not present

## 2019-12-22 DIAGNOSIS — M810 Age-related osteoporosis without current pathological fracture: Secondary | ICD-10-CM | POA: Diagnosis not present

## 2019-12-29 ENCOUNTER — Encounter: Payer: Self-pay | Admitting: Family Medicine

## 2020-03-11 ENCOUNTER — Other Ambulatory Visit: Payer: Self-pay

## 2020-03-14 ENCOUNTER — Other Ambulatory Visit: Payer: Self-pay

## 2020-03-14 ENCOUNTER — Ambulatory Visit (INDEPENDENT_AMBULATORY_CARE_PROVIDER_SITE_OTHER): Payer: Medicare Other | Admitting: Family Medicine

## 2020-03-14 ENCOUNTER — Encounter: Payer: Self-pay | Admitting: Family Medicine

## 2020-03-14 VITALS — BP 120/78 | HR 70 | Temp 98.1°F | Ht 65.5 in | Wt 161.3 lb

## 2020-03-14 DIAGNOSIS — E785 Hyperlipidemia, unspecified: Secondary | ICD-10-CM | POA: Diagnosis not present

## 2020-03-14 DIAGNOSIS — E039 Hypothyroidism, unspecified: Secondary | ICD-10-CM | POA: Diagnosis not present

## 2020-03-14 DIAGNOSIS — M810 Age-related osteoporosis without current pathological fracture: Secondary | ICD-10-CM

## 2020-03-14 DIAGNOSIS — Z23 Encounter for immunization: Secondary | ICD-10-CM | POA: Diagnosis not present

## 2020-03-14 DIAGNOSIS — D649 Anemia, unspecified: Secondary | ICD-10-CM

## 2020-03-14 DIAGNOSIS — R350 Frequency of micturition: Secondary | ICD-10-CM

## 2020-03-14 DIAGNOSIS — E538 Deficiency of other specified B group vitamins: Secondary | ICD-10-CM | POA: Diagnosis not present

## 2020-03-14 LAB — TSH: TSH: 2.07 mIU/L (ref 0.40–4.50)

## 2020-03-14 LAB — POC URINALSYSI DIPSTICK (AUTOMATED)
Bilirubin, UA: NEGATIVE
Blood, UA: NEGATIVE
Glucose, UA: NEGATIVE
Ketones, UA: NEGATIVE
Leukocytes, UA: NEGATIVE
Nitrite, UA: NEGATIVE
Protein, UA: POSITIVE — AB
Spec Grav, UA: 1.02 (ref 1.010–1.025)
Urobilinogen, UA: 0.2 E.U./dL
pH, UA: 6 (ref 5.0–8.0)

## 2020-03-14 NOTE — Patient Instructions (Signed)
[email or web address]  

## 2020-03-14 NOTE — Progress Notes (Signed)
Nancy [name] DOB: 1935-09-04 Encounter date: 03/14/2020  This is a 84 y.o. female who presents with Chief Complaint  Patient presents with  . Hypothyroidism    History of present illness: Last visit with me was 08/2019 to establish care.  Osteoporosis: prolia. Managed by Dr. Layne Benton. Has been on this for 4-5 years. Had improvement in bone density since starting this. has bone density scheduled for later this year.   Hypothyroid: on synthroid 57mcg daily  Takes the claritin regularly for allergies. Have been doing ok.   Usually likes to travel with sister. Takes two big trips a year. Hasn't been able to travel due to [place].   Last gyn visit was 2 years ago. Due for repeat visit with Dr. Philis Pique. At night waking up frequently to go to the bathroom. Gets up at least 3 times/night. Falls back asleep easily. No leaking, no urgency.    Does not follow with dermatology, but sees plastics on occasion and he does skin checks. No skin issues.    Allergies  Allergen Reactions  . Norco [Hydrocodone-Acetaminophen] Hives  . Penicillins Hives    Has patient had a PCN reaction causing immediate rash, facial/tongue/throat swelling, SOB or lightheadedness with hypotension: yes Has patient had a PCN reaction causing severe rash involving mucus membranes or skin necrosis: no Has patient had a PCN reaction that required hospitalization: no Has patient had a PCN reaction occurring within the last 10 years: yes If all of the above answers are "NO", then may proceed with Cephalosporin use.   . Sulfamethoxazole Hives   Current Meds  Medication Sig  . cholecalciferol (VITAMIN D3) 25 MCG (1000 UT) tablet Take 2,000 Units by mouth daily.   Marland Kitchen loratadine (CLARITIN) 10 MG tablet Take 10 mg by mouth daily.  . Multiple Vitamins-Minerals (ZINC PO) Take by mouth.  . PROLIA 60 MG/ML SOSY injection Inject into the skin once.  Marland Kitchen SYNTHROID 50 MCG tablet TAKE 1 TABLET BY MOUTH  DAILY   Current  Facility-Administered Medications for the 03/14/20 encounter (Office Visit) with Caren Macadam, MD  Medication  . 0.9 %  sodium chloride infusion    Review of Systems  Constitutional: Negative for chills, fatigue and fever.  Respiratory: Negative for cough, chest tightness, shortness of breath and wheezing.   Cardiovascular: Negative for chest pain, palpitations and leg swelling.    Objective:  BP 120/78 (BP Location: Left Arm, Patient Position: Sitting, Cuff Size: Normal)   Pulse 70   Temp 98.1 F (36.7 C) (Oral)   Ht 5' 5.5" (1.664 m)   Wt 161 lb 4.8 oz (73.2 kg)   BMI 26.43 kg/m   Weight: 161 lb 4.8 oz (73.2 kg)   BP Readings from Last 3 Encounters:  03/14/20 120/78  10/22/19 116/60  09/11/19 102/64   Wt Readings from Last 3 Encounters:  03/14/20 161 lb 4.8 oz (73.2 kg)  10/22/19 166 lb 12.8 oz (75.7 kg)  10/14/19 166 lb 12.8 oz (75.7 kg)    Physical Exam Constitutional:      General: She is not in acute distress.    Appearance: She is well-developed.  Cardiovascular:     Rate and Rhythm: Normal rate and regular rhythm.     Heart sounds: Normal heart sounds. No murmur heard.  No friction rub.  Pulmonary:     Effort: Pulmonary effort is normal. No respiratory distress.     Breath sounds: Normal breath sounds. No wheezing or rales.  Musculoskeletal:     Right  lower leg: No edema.     Left lower leg: No edema.  Neurological:     Mental Status: She is alert and oriented to person, place, and time.  Psychiatric:        Behavior: Behavior normal.     Assessment/Plan  1. Hypothyroidism, unspecified type Continue with synthroid dose 54mcg. - TSH; Future  2. Osteoporosis, unspecified osteoporosis type, unspecified pathological fracture presence On the prolia. Doing well with this.   3. Hyperlipidemia, unspecified hyperlipidemia type Has been diet controlled.  4. Anemia, unspecified type Has had issues on and off with anemia all of life. Doesn't  tolerate oral iron well. We will check some additional bloodwork and follow up recommendations pending this. - CBC with Differential/Platelet; Future - Iron, TIBC and Ferritin Panel; Future  5. Urinary frequency UA in office stable. No evidence of infection. If worsening sx let us know. - Urinalysis; Future  6. B12 deficiency - Vitamin B12; Future   Return in about 6 months (around 09/11/2020).     Micheline Rough, MD

## 2020-03-15 LAB — CBC WITH DIFFERENTIAL/PLATELET
Absolute Monocytes: 488 cells/uL (ref 200–950)
Basophils Absolute: 61 cells/uL (ref 0–200)
Basophils Relative: 1 %
Eosinophils Absolute: 250 cells/uL (ref 15–500)
Eosinophils Relative: 4.1 %
HCT: 37.3 % (ref 35.0–45.0)
Hemoglobin: 12.3 g/dL (ref 11.7–15.5)
Lymphs Abs: 1800 cells/uL (ref 850–3900)
MCH: 30.6 pg (ref 27.0–33.0)
MCHC: 33 g/dL (ref 32.0–36.0)
MCV: 92.8 fL (ref 80.0–100.0)
MPV: 10 fL (ref 7.5–12.5)
Monocytes Relative: 8 %
Neutro Abs: 3501 cells/uL (ref 1500–7800)
Neutrophils Relative %: 57.4 %
Platelets: 236 10*3/uL (ref 140–400)
RBC: 4.02 10*6/uL (ref 3.80–5.10)
RDW: 12.9 % (ref 11.0–15.0)
Total Lymphocyte: 29.5 %
WBC: 6.1 10*3/uL (ref 3.8–10.8)

## 2020-03-15 LAB — IRON,TIBC AND FERRITIN PANEL
%SAT: 24 % (calc) (ref 16–45)
Ferritin: 41 ng/mL (ref 16–288)
Iron: 73 ug/dL (ref 45–160)
TIBC: 307 mcg/dL (calc) (ref 250–450)

## 2020-03-15 LAB — URINALYSIS
Bilirubin Urine: NEGATIVE
Glucose, UA: NEGATIVE
Hgb urine dipstick: NEGATIVE
Ketones, ur: NEGATIVE
Leukocytes,Ua: NEGATIVE
Nitrite: NEGATIVE
Specific Gravity, Urine: 1.024 (ref 1.001–1.03)
pH: 6.5 (ref 5.0–8.0)

## 2020-03-15 LAB — VITAMIN B12: Vitamin B-12: 438 pg/mL (ref 200–1100)

## 2020-03-17 DIAGNOSIS — Z1272 Encounter for screening for malignant neoplasm of vagina: Secondary | ICD-10-CM | POA: Diagnosis not present

## 2020-03-17 DIAGNOSIS — Z1231 Encounter for screening mammogram for malignant neoplasm of breast: Secondary | ICD-10-CM | POA: Diagnosis not present

## 2020-04-15 ENCOUNTER — Other Ambulatory Visit: Payer: Self-pay | Admitting: Family Medicine

## 2020-05-02 ENCOUNTER — Telehealth: Payer: Self-pay | Admitting: Family Medicine

## 2020-05-02 NOTE — Telephone Encounter (Signed)
Left message for patient to call back and schedule Medicare Annual Wellness Visit (AWV) either virtually or in office.  Last AWV 02/14/17; please schedule at anytime with [facility] Nurse Health Advisor 2.  This should be a 45 minute visit.

## 2020-05-17 ENCOUNTER — Ambulatory Visit (INDEPENDENT_AMBULATORY_CARE_PROVIDER_SITE_OTHER): Payer: Medicare Other

## 2020-05-17 ENCOUNTER — Other Ambulatory Visit: Payer: Self-pay

## 2020-05-17 VITALS — BP 120/76 | HR 84 | Temp 97.7°F | Resp 20 | Wt 162.5 lb

## 2020-05-17 DIAGNOSIS — Z0000 Encounter for general adult medical examination without abnormal findings: Secondary | ICD-10-CM

## 2020-05-17 NOTE — Patient Instructions (Addendum)
Nancy [name] , Thank you for taking time to come for your Medicare Wellness Visit. I appreciate your ongoing commitment to your health goals. Please review the following plan we discussed and let me know if I can assist you in the future.   Screening recommendations/referrals: Colonoscopy: Done 10/22/19 Mammogram: Done 08/02/10 Bone Density: Done 05/26/18 Recommended yearly ophthalmology/optometry visit for glaucoma screening and checkup Recommended yearly dental visit for hygiene and checkup  Vaccinations: Influenza vaccine:Done 03/14/20 Pneumococcal vaccine: Up to date Tdap vaccine: Up to date Shingles vaccine: Completed 09/16/17 & 07/09/18 Covid-19:Completed 3/22 & 09/30/19  Advanced directives: Please bring a copy of your health care power of attorney and living will to the office at your convenience.  Conditions/risks identified: None at this time  Next appointment: Follow up in one year for your annual wellness visit    Preventive Care 65 Years and Older, Female Preventive care refers to lifestyle choices and visits with your health care provider that can promote health and wellness. What does preventive care include?  A yearly physical exam. This is also called an annual well check.  Dental exams once or twice a year.  Routine eye exams. Ask your health care provider how often you should have your eyes checked.  Personal lifestyle choices, including:  Daily care of your teeth and gums.  Regular physical activity.  Eating a healthy diet.  Avoiding tobacco and drug use.  Limiting alcohol use.  Practicing safe sex.  Taking low-dose aspirin every day.  Taking vitamin and mineral supplements as recommended by your health care provider. What happens during an annual well check? The services and screenings done by your health care provider during your annual well check will depend on your age, overall health, lifestyle risk factors, and family history of disease. Counseling    Your health care provider may ask you questions about your:  Alcohol use.  Tobacco use.  Drug use.  Emotional well-being.  Home and relationship well-being.  Sexual activity.  Eating habits.  History of falls.  Memory and ability to understand (cognition).  Work and work Statistician.  Reproductive health. Screening  You may have the following tests or measurements:  Height, weight, and BMI.  Blood pressure.  Lipid and cholesterol levels. These may be checked every 5 years, or more frequently if you are over 20 years old.  Skin check.  Lung cancer screening. You may have this screening every year starting at age 44 if you have a 30-pack-year history of smoking and currently smoke or have quit within the past 15 years.  Fecal occult blood test (FOBT) of the stool. You may have this test every year starting at age 37.  Flexible sigmoidoscopy or colonoscopy. You may have a sigmoidoscopy every 5 years or a colonoscopy every 10 years starting at age 36.  Hepatitis C blood test.  Hepatitis B blood test.  Sexually transmitted disease (STD) testing.  Diabetes screening. This is done by checking your blood sugar (glucose) after you have not eaten for a while (fasting). You may have this done every 1-3 years.  Bone density scan. This is done to screen for osteoporosis. You may have this done starting at age 42.  Mammogram. This may be done every 1-2 years. Talk to your health care provider about how often you should have regular mammograms. Talk with your health care provider about your test results, treatment options, and if necessary, the need for more tests. Vaccines  Your health care provider may recommend certain vaccines,  such as:  Influenza vaccine. This is recommended every year.  Tetanus, diphtheria, and acellular pertussis (Tdap, Td) vaccine. You may need a Td booster every 10 years.  Zoster vaccine. You may need this after age 25.  Pneumococcal 13-valent  conjugate (PCV13) vaccine. One dose is recommended after age 55.  Pneumococcal polysaccharide (PPSV23) vaccine. One dose is recommended after age 45. Talk to your health care provider about which screenings and vaccines you need and how often you need them. This information is not intended to replace advice given to you by your health care provider. Make sure you discuss any questions you have with your health care provider. Document Released: 07/01/2015 Document Revised: 02/22/2016 Document Reviewed: 04/05/2015 Elsevier Interactive Patient Education  2017 [place] Prevention in the Home Falls can cause injuries. They can happen to people of all ages. There are many things you can do to make your home safe and to help prevent falls. What can I do on the outside of my home?  Regularly fix the edges of walkways and driveways and fix any cracks.  Remove anything that might make you trip as you walk through a door, such as a raised step or threshold.  Trim any bushes or trees on the path to your home.  Use bright outdoor lighting.  Clear any walking paths of anything that might make someone trip, such as rocks or tools.  Regularly check to see if handrails are loose or broken. Make sure that both sides of any steps have handrails.  Any raised decks and porches should have guardrails on the edges.  Have any leaves, snow, or ice cleared regularly.  Use sand or salt on walking paths during winter.  Clean up any spills in your garage right away. This includes oil or grease spills. What can I do in the bathroom?  Use night lights.  Install grab bars by the toilet and in the tub and shower. Do not use towel bars as grab bars.  Use non-skid mats or decals in the tub or shower.  If you need to sit down in the shower, use a plastic, non-slip stool.  Keep the floor dry. Clean up any water that spills on the floor as soon as it happens.  Remove soap buildup in the tub or  shower regularly.  Attach bath mats securely with double-sided non-slip rug tape.  Do not have throw rugs and other things on the floor that can make you trip. What can I do in the bedroom?  Use night lights.  Make sure that you have a light by your bed that is easy to reach.  Do not use any sheets or blankets that are too big for your bed. They should not hang down onto the floor.  Have a firm chair that has side arms. You can use this for support while you get dressed.  Do not have throw rugs and other things on the floor that can make you trip. What can I do in the kitchen?  Clean up any spills right away.  Avoid walking on wet floors.  Keep items that you use a lot in easy-to-reach places.  If you need to reach something above you, use a strong step stool that has a grab bar.  Keep electrical cords out of the way.  Do not use floor polish or wax that makes floors slippery. If you must use wax, use non-skid floor wax.  Do not have throw rugs and other things on  the floor that can make you trip. What can I do with my stairs?  Do not leave any items on the stairs.  Make sure that there are handrails on both sides of the stairs and use them. Fix handrails that are broken or loose. Make sure that handrails are as long as the stairways.  Check any carpeting to make sure that it is firmly attached to the stairs. Fix any carpet that is loose or worn.  Avoid having throw rugs at the top or bottom of the stairs. If you do have throw rugs, attach them to the floor with carpet tape.  Make sure that you have a light switch at the top of the stairs and the bottom of the stairs. If you do not have them, ask someone to add them for you. What else can I do to help prevent falls?  Wear shoes that:  Do not have high heels.  Have rubber bottoms.  Are comfortable and fit you well.  Are closed at the toe. Do not wear sandals.  If you use a stepladder:  Make sure that it is fully  opened. Do not climb a closed stepladder.  Make sure that both sides of the stepladder are locked into place.  Ask someone to hold it for you, if possible.  Clearly mark and make sure that you can see:  Any grab bars or handrails.  First and last steps.  Where the edge of each step is.  Use tools that help you move around (mobility aids) if they are needed. These include:  Canes.  Walkers.  Scooters.  Crutches.  Turn on the lights when you go into a dark area. Replace any light bulbs as soon as they burn out.  Set up your furniture so you have a clear path. Avoid moving your furniture around.  If any of your floors are uneven, fix them.  If there are any pets around you, be aware of where they are.  Review your medicines with your doctor. Some medicines can make you feel dizzy. This can increase your chance of falling. Ask your doctor what other things that you can do to help prevent falls. This information is not intended to replace advice given to you by your health care provider. Make sure you discuss any questions you have with your health care provider. Document Released: 03/31/2009 Document Revised: 11/10/2015 Document Reviewed: 07/09/2014 Elsevier Interactive Patient Education  2017 [employer].

## 2020-05-17 NOTE — Progress Notes (Signed)
Subjective:   Nancy [name] is a 84 y.o. female who presents for Medicare Annual (Subsequent) preventive examination.  Review of Systems     Cardiac Risk Factors include: advanced age (>53men, >102 women);dyslipidemia     Objective:    Today's Vitals   05/17/20 1432  BP: 120/76  Pulse: 84  Resp: 20  Temp: 97.7 F (36.5 C)  SpO2: 98%  Weight: 162 lb 8 oz (73.7 kg)   Body mass index is 26.63 kg/m.  Advanced Directives 05/17/2020 03/30/2019 03/18/2018 03/13/2018 02/14/2017 05/20/2014  Does Patient Have a Medical Advance Directive? Yes Yes Yes Yes Yes No  Type of Advance Directive Living will;Out of facility DNR (pink MOST or yellow form) [place];Living will [place];Living will [place];Living will - -  Does patient want to make changes to medical advance directive? - No - Patient declined No - Patient declined No - Patient declined - -  Copy of Speculator in Chart? - - No - copy requested No - copy requested - -  Would patient like information on creating a medical advance directive? - - - - - No - patient declined information    Current Medications (verified) Outpatient Encounter Medications as of 05/17/2020  Medication Sig  . cholecalciferol (VITAMIN D3) 25 MCG (1000 UT) tablet Take 2,000 Units by mouth daily.   Marland Kitchen loratadine (CLARITIN) 10 MG tablet Take 10 mg by mouth daily.  . Multiple Vitamins-Minerals (ZINC PO) Take by mouth.  . PROLIA 60 MG/ML SOSY injection Inject into the skin once.  Marland Kitchen SYNTHROID 50 MCG tablet TAKE 1 TABLET BY MOUTH  DAILY   Facility-Administered Encounter Medications as of 05/17/2020  Medication  . 0.9 %  sodium chloride infusion    Allergies (verified) Norco [hydrocodone-acetaminophen], Penicillins, and Sulfamethoxazole   History: Past Medical History:  Diagnosis Date  . Allergy   . Avascular necrosis (HCC)    RIGHT SHOULDER  . Blood transfusion without  reported diagnosis    With shoulder surgery, 2019  . Chronic cough    saw several specialists per her report, NONE NOW  . Closed 4-part fracture of proximal end of right humerus, with malunion, subsequent encounter with AVN 03/18/2018  . Hypothyroidism   . Osteoporosis    Sees Dr. Layne Benton at [facility], on prolia  . PONV (postoperative nausea and vomiting)    severe post op nausea and vomiting   Past Surgical History:  Procedure Laterality Date  . ABDOMINAL HYSTERECTOMY  1975   PARTIAL; had ovarian cysts. no cancer concerns.  . APPENDECTOMY    . LAPAROSCOPY     removal of ovaries  . OOPHORECTOMY Bilateral 2007  . removal growth thyroid  1998  . REVERSE SHOULDER ARTHROPLASTY Right 03/18/2018   Procedure: REVERSE RIGHT SHOULDER ARTHROPLASTY;  Surgeon: Marchia Bond, MD;  Location: [facility];  Service: Orthopedics;  Laterality: Right;  . TONSILLECTOMY    . TRIGGER FINGER RELEASE Left 04/06/2019   Procedure: DUPUYTREN 'S AND CONTRACTURE RELEASE LEFT FOURTH FINGER;  Surgeon: Cristine Polio, MD;  Location: [facility];  Service: Plastics;  Laterality: Left;   Family History  Problem Relation Age of Onset  . Dementia Mother   . Colon cancer Mother 20  . Parkinsonism Father   . Hypertension Father   . Colon cancer Sister 53  . Hypertension Sister   . Stroke Sister   . Osteoporosis Other   . Stomach cancer Neg Hx   .  Colon polyps Neg Hx   . Esophageal cancer Neg Hx   . Rectal cancer Neg Hx    Social History   Socioeconomic History  . Marital status: Widowed    Spouse name: Not on file  . Number of children: Not on file  . Years of education: Not on file  . Highest education level: Not on file  Occupational History  . Occupation: retired  Tobacco Use  . Smoking status: Never Smoker  . Smokeless tobacco: Never Used  Vaping Use  . Vaping Use: Never used  Substance and Sexual Activity  . Alcohol use: Never  . Drug use: Never  . Sexual activity: Yes    Other Topics Concern  . 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 Difficulty of Paying Living Expenses: Not hard at all  Food Insecurity: No Food Insecurity  . Worried About Charity fundraiser in the Last Year: Never true  . Ran Out of Food in the Last Year: Never true  Transportation Needs: No Transportation Needs  . Lack of Transportation (Medical): No  . Lack of Transportation (Non-Medical): No  Physical Activity: Inactive  . Days of Exercise per Week: 0 days  . Minutes of Exercise per Session: 0 min  Stress: No Stress Concern Present  . Feeling of Stress : Not at all  Social Connections: Moderately Isolated  . Frequency of Communication with Friends and Family: More than three times a week  . Frequency of Social Gatherings with Friends and Family: More than three times a week  . Attends Religious Services: More than 4 times per year  . Active Member of Clubs or Organizations: No  . Attends Archivist Meetings: Never  . Marital Status: Widowed    Tobacco Counseling Counseling given: Not Answered   Clinical Intake:  Pre-visit preparation completed: Yes  Pain : No/denies pain     BMI - recorded: 26.63 Nutritional Status: BMI 25 -29 Overweight Nutritional Risks: None Diabetes: No  How often do you need to have someone help you when you read instructions, pamphlets, or other written materials from your doctor or pharmacy?: 1 - Never  Diabetic?No  Interpreter Needed?: No  Information entered by :: Charlott Rakes, LPN   Activities of Daily Living In your present state of health, do you have any difficulty performing the following activities: 05/17/2020  Hearing? N  Vision? N  Difficulty concentrating or  making decisions? N  Walking or climbing stairs? N  Dressing or bathing? N  Doing errands, shopping? N  Preparing Food and eating ? N  Using the Toilet? N  In the past six months, have you accidently leaked urine? N  Do you have problems with loss of bowel control? N  Managing your Medications? N  Managing your Finances? N  Housekeeping or managing your Housekeeping? N  Some recent data might be hidden    Patient Care Team: Caren Macadam, MD as PCP - General (Family Medicine) Bobbye Charleston, MD as Consulting Physician (Obstetrics and Gynecology) Ninetta Lights, MD (Inactive) as Consulting Physician (Orthopedic Surgery) Verner Chol, MD as Consulting Physician (Sports Medicine) Verner Chol, MD as Consulting Physician (  Sports Medicine) Marchia Bond, MD as Consulting Physician (Orthopedic Surgery)  Indicate any recent Medical Services you may have received from other than [facility] providers in the past year (date may be approximate).     Assessment:   This is a routine wellness examination for Shaylan.  Hearing/Vision screen  Hearing Screening   125Hz  250Hz  500Hz  1000Hz  2000Hz  3000Hz  4000Hz  6000Hz  8000Hz   Right ear:           Left ear:           Comments: Pt denies any hearing issues  Vision Screening Comments: Pt follows up with Dr Luberta Mutter for annual eye exams   Dietary issues and exercise activities discussed: Current Exercise Habits: The patient does not participate in regular exercise at present  Goals    . patient     Will continue to travel several times a year     . Patient Stated     None at this time      Depression Screen PHQ 2/9 Scores 05/17/2020 03/14/2020 01/30/2018 02/14/2017 10/05/2015 04/29/2014 02/23/2013  PHQ - 2 Score 0 0 0 0 0 0 1    Fall Risk Fall Risk  05/17/2020 01/19/2019 01/30/2018 02/14/2017 10/05/2015  Falls in the past year? 0 1 No No No  Comment - Emmi Telephone Survey: data to providers prior to load - - -   Number falls in past yr: 0 1 - - -  Comment - Emmi Telephone Survey Actual Response = 11 - - -  Injury with Fall? 0 1 - - -  Risk for fall due to : Impaired vision - - - -  Follow up Falls prevention discussed - - - -    Any stairs in or around the home? Yes  If so, are there any without handrails? No  Home free of loose throw rugs in walkways, pet beds, electrical cords, etc? Yes  Adequate lighting in your home to reduce risk of falls? Yes   ASSISTIVE DEVICES UTILIZED TO PREVENT FALLS:  Life alert? No  Use of a cane, walker or w/c? No  Grab bars in the bathroom? No  Shower chair or bench in shower? Yes  Elevated toilet seat or a handicapped toilet? No   TIMED UP AND GO:  Was the test performed? Yes .  Length of time to ambulate 10 feet: 10  sec.   Gait steady and fast without use of assistive device  Cognitive Function:     6CIT Screen 05/17/2020  What Year? 0 points  What month? 0 points  Count back from 20 0 points  Months in reverse 0 points  Repeat phrase 0 points    Immunizations Immunization History  Administered Date(s) Administered  . Fluad Quad(high Dose 65+) 03/14/2020  . Influenza Whole 04/17/2007, 04/06/2010  . Influenza, High Dose Seasonal PF 05/27/2015, 03/15/2016, 03/03/2018  . Influenza,inj,Quad PF,6+ Mos 02/23/2013  . Influenza-Unspecified 03/17/2014, 03/18/2017  . PFIZER SARS-COV-2 Vaccination 09/07/2019, 09/30/2019  . Pneumococcal Conjugate-13 05/27/2015  . Pneumococcal Polysaccharide-23 04/14/2008, 11/01/2017  . Td 06/18/2005, 03/18/2009  . Tdap 01/17/2020  . Zoster 04/27/2008  . Zoster Recombinat (Shingrix) 09/16/2017, 07/09/2018    TDAP status: Up to date Flu Vaccine status: Up to date Pneumococcal vaccine status: Up to date Covid-19 vaccine status: Completed vaccines  Qualifies for Shingles Vaccine? Yes   Zostavax completed Yes   Shingrix Completed?: Yes  Screening Tests Health Maintenance  Topic Date Due  . TETANUS/TDAP   01/16/2030  . INFLUENZA VACCINE  Completed  .  DEXA SCAN  Completed  . COVID-19 Vaccine  Completed  . PNA vac Low Risk Adult  Completed    Health Maintenance  There are no preventive care reminders to display for this patient.  Colorectal cancer screening: No longer required.  Mammogram status: Completed 08/02/10. Repeat every year Bone Density status: Completed 05/26/18. Results reflect: Bone density results: OSTEOPOROSIS. Repeat every 2 years.   Additional Screening:  Vision Screening: Recommended annual ophthalmology exams for early detection of glaucoma and other disorders of the eye. Is the patient up to date with their annual eye exam?  Yes  Who is the provider or what is the name of the office in which the patient attends annual eye exams? Dr Ellie Lunch   Dental Screening: Recommended annual dental exams for proper oral hygiene  Community Resource Referral / Chronic Care Management: CRR required this visit?  No   CCM required this visit?  No      Plan:     I have personally reviewed and noted the following in the patient's chart:   . Medical and social history . Use of alcohol, tobacco or illicit drugs  . Current medications and supplements . Functional ability and status . Nutritional status . Physical activity . Advanced directives . List of other physicians . Hospitalizations, surgeries, and ER visits in previous 12 months . Vitals . Screenings to include cognitive, depression, and falls . Referrals and appointments  In addition, I have reviewed and discussed with patient certain preventive protocols, quality metrics, and best practice recommendations. A written personalized care plan for preventive services as well as general preventive health recommendations were provided to patient.     Willette Brace, LPN   [id number]   Nurse Notes: None

## 2020-05-20 ENCOUNTER — Ambulatory Visit: Payer: Medicare Other | Attending: Internal Medicine

## 2020-05-20 DIAGNOSIS — Z23 Encounter for immunization: Secondary | ICD-10-CM

## 2020-05-20 NOTE — Progress Notes (Signed)
   Covid-19 Vaccination Clinic  Name:  Nancy [name]    MRN: [id number] DOB: [birth year]  05/20/2020  [name] was observed post Covid-19 immunization for15 mins without incident. She was provided with Vaccine Information Sheet and instruction to access the V-Safe system.   [name] was instructed to call 911 with any severe reactions post vaccine: Marland Kitchen Difficulty breathing  . Swelling of face and throat  . A fast heartbeat  . A bad rash all over body  . Dizziness and weakness   Immunizations Administered    Name Date Dose VIS Date Route   Pfizer COVID-19 Vaccine 05/20/2020  1:26 PM 0.3 mL 04/06/2020 Intramuscular   Manufacturer: [place]   Lot: [id number]   [place]: [address]-0

## 2020-06-22 DIAGNOSIS — E559 Vitamin D deficiency, unspecified: Secondary | ICD-10-CM | POA: Diagnosis not present

## 2020-06-22 DIAGNOSIS — R5383 Other fatigue: Secondary | ICD-10-CM | POA: Diagnosis not present

## 2020-06-22 DIAGNOSIS — M810 Age-related osteoporosis without current pathological fracture: Secondary | ICD-10-CM | POA: Diagnosis not present

## 2020-06-28 DIAGNOSIS — M25511 Pain in right shoulder: Secondary | ICD-10-CM | POA: Diagnosis not present

## 2020-06-28 DIAGNOSIS — M810 Age-related osteoporosis without current pathological fracture: Secondary | ICD-10-CM | POA: Diagnosis not present

## 2020-07-12 LAB — HM DEXA SCAN

## 2020-07-20 ENCOUNTER — Telehealth: Payer: Self-pay

## 2020-07-20 NOTE — Telephone Encounter (Signed)
Informed patient of Dexa scan results and to continue prolia.

## 2020-08-26 DIAGNOSIS — H25813 Combined forms of age-related cataract, bilateral: Secondary | ICD-10-CM | POA: Diagnosis not present

## 2020-08-26 DIAGNOSIS — H52203 Unspecified astigmatism, bilateral: Secondary | ICD-10-CM | POA: Diagnosis not present

## 2020-09-09 ENCOUNTER — Other Ambulatory Visit: Payer: Self-pay

## 2020-09-12 ENCOUNTER — Encounter: Payer: Self-pay | Admitting: Family Medicine

## 2020-09-12 ENCOUNTER — Ambulatory Visit (INDEPENDENT_AMBULATORY_CARE_PROVIDER_SITE_OTHER): Payer: Medicare Other | Admitting: Family Medicine

## 2020-09-12 ENCOUNTER — Other Ambulatory Visit: Payer: Self-pay

## 2020-09-12 VITALS — BP 110/70 | HR 71 | Temp 97.7°F | Ht 65.5 in | Wt 159.7 lb

## 2020-09-12 DIAGNOSIS — E875 Hyperkalemia: Secondary | ICD-10-CM

## 2020-09-12 DIAGNOSIS — E039 Hypothyroidism, unspecified: Secondary | ICD-10-CM

## 2020-09-12 DIAGNOSIS — M25642 Stiffness of left hand, not elsewhere classified: Secondary | ICD-10-CM | POA: Diagnosis not present

## 2020-09-12 DIAGNOSIS — E785 Hyperlipidemia, unspecified: Secondary | ICD-10-CM

## 2020-09-12 DIAGNOSIS — Z131 Encounter for screening for diabetes mellitus: Secondary | ICD-10-CM | POA: Diagnosis not present

## 2020-09-12 LAB — COMPREHENSIVE METABOLIC PANEL
ALT: 8 U/L (ref 0–35)
AST: 13 U/L (ref 0–37)
Albumin: 4.5 g/dL (ref 3.5–5.2)
Alkaline Phosphatase: 29 U/L — ABNORMAL LOW (ref 39–117)
BUN: 21 mg/dL (ref 6–23)
CO2: 26 mEq/L (ref 19–32)
Calcium: 9.8 mg/dL (ref 8.4–10.5)
Chloride: 105 mEq/L (ref 96–112)
Creatinine, Ser: 0.98 mg/dL (ref 0.40–1.20)
GFR: 53.61 mL/min — ABNORMAL LOW (ref >60.00)
Glucose, Bld: 96 mg/dL (ref 70–99)
Potassium: 5.5 mEq/L — ABNORMAL HIGH (ref 3.5–5.1)
Sodium: 140 mEq/L (ref 135–145)
Total Bilirubin: 0.6 mg/dL (ref 0.2–1.2)
Total Protein: 6.9 g/dL (ref 6.0–8.3)

## 2020-09-12 LAB — LIPID PANEL
Cholesterol: 227 mg/dL — ABNORMAL HIGH (ref 0–200)
HDL: 62.6 mg/dL (ref >39.00)
LDL Cholesterol: 147 mg/dL — ABNORMAL HIGH (ref 0–99)
NonHDL: 164.31
Total CHOL/HDL Ratio: 4
Triglycerides: 88 mg/dL (ref 0.0–149.0)
VLDL: 17.6 mg/dL (ref 0.0–40.0)

## 2020-09-12 LAB — TSH: TSH: 1.27 u[IU]/mL (ref 0.35–4.50)

## 2020-09-12 NOTE — Progress Notes (Signed)
[name] DOB: 08-Oct-1935 Encounter date: 09/12/2020  This is a 85 y.o. female who presents with Chief Complaint  Patient presents with  . Follow-up    History of present illness: She has had two bathrooms remodeled in last few months.  She is having cataract surgery on Thursday right eye.   Osteoporosis: prolia. Managed by Dr. Layne Benton. Has been on this for 4-5 years. Had improvement in bone density since starting this. Had repeat bone density this year; still improvement.   Hypothyroid: on synthroid 42mcg daily  Takes the claritin regularly for allergies. Have been doing ok with these.   Hopes to travel again later this year.   Last gyn visit was 2 years ago. Due for repeat visit with Dr. Philis Pique. At night waking up frequently to go to the bathroom. Gets up at least 3 times/night. Falls back asleep easily. No leaking, no urgency.    Allergies  Allergen Reactions  . Norco [Hydrocodone-Acetaminophen] Hives  . Penicillins Hives    Has patient had a PCN reaction causing immediate rash, facial/tongue/throat swelling, SOB or lightheadedness with hypotension: yes Has patient had a PCN reaction causing severe rash involving mucus membranes or skin necrosis: no Has patient had a PCN reaction that required hospitalization: no Has patient had a PCN reaction occurring within the last 10 years: yes If all of the above answers are "NO", then may proceed with Cephalosporin use.   . Sulfamethoxazole Hives   Current Meds  Medication Sig  . CALCIUM PO Take 600 mg by mouth daily.  . cholecalciferol (VITAMIN D3) 25 MCG (1000 UT) tablet Take 2,000 Units by mouth daily.   Marland Kitchen loratadine (CLARITIN) 10 MG tablet Take 10 mg by mouth daily.  . Multiple Vitamins-Minerals (ZINC PO) Take 50 mg by mouth.  . Omega-3 Fatty Acids (FISH OIL PO) Take 1,200 mg by mouth.  . PROLIA 60 MG/ML SOSY injection Inject into the skin once.  Marland Kitchen SYNTHROID 50 MCG tablet TAKE 1 TABLET BY MOUTH  DAILY    Current Facility-Administered Medications for the 09/12/20 encounter (Office Visit) with Caren Macadam, MD  Medication  . 0.9 %  sodium chloride infusion    Review of Systems  Constitutional: Negative for chills, fatigue and fever.  Respiratory: Negative for cough, chest tightness, shortness of breath and wheezing.   Cardiovascular: Negative for chest pain, palpitations and leg swelling.  Musculoskeletal:       Trigger finger release 4th finger left hand 03/2019; follow ups with surgeon were disrupted with pandemic and surgeon has since passed away. She cannot flex 4th digit. Would like to see another hand specialist.    Objective:  BP 110/70 (BP Location: Left Arm, Patient Position: Sitting, Cuff Size: Large)   Pulse 71   Temp 97.7 F (36.5 C) (Oral)   Ht 5' 5.5" (1.664 m)   Wt 159 lb 11.2 oz (72.4 kg)   SpO2 98%   BMI 26.17 kg/m   Weight: 159 lb 11.2 oz (72.4 kg)   BP Readings from Last 3 Encounters:  09/12/20 110/70  05/17/20 120/76  03/14/20 120/78   Wt Readings from Last 3 Encounters:  09/12/20 159 lb 11.2 oz (72.4 kg)  05/17/20 162 lb 8 oz (73.7 kg)  03/14/20 161 lb 4.8 oz (73.2 kg)    Physical Exam Constitutional:      General: She is not in acute distress.    Appearance: She is well-developed.  Neck:     Thyroid: No thyroid mass, thyromegaly or thyroid  tenderness.  Cardiovascular:     Rate and Rhythm: Normal rate and regular rhythm.     Heart sounds: Normal heart sounds. No murmur heard. No friction rub.  Pulmonary:     Effort: Pulmonary effort is normal. No respiratory distress.     Breath sounds: Normal breath sounds. No wheezing or rales.  Musculoskeletal:     Right lower leg: No edema.     Left lower leg: No edema.     Comments: Prominent tendon fourth digit left hand through palm.  No tenderness palpation.  Unable to flex more than 5 degrees  Lymphadenopathy:     Cervical: No cervical adenopathy.  Neurological:     Mental Status: She is  alert and oriented to person, place, and time.  Psychiatric:        Behavior: Behavior normal.     Assessment/Plan  1. Hypothyroidism, unspecified type Continue with current dose of Synthroid 50 mcg daily. - TSH; Future - TSH  2. Hyperlipidemia, unspecified hyperlipidemia type Cholesterols been well controlled for with diet and regular exercise. - Lipid panel; Future - Lipid panel  4. Stiffness of finger joint, left Trigger finger release back in October 2020.  Follow-up with interrupted secondary to Covid and surgeon has since passed away.  She needs a new hand specialist.  Limited range of motion. - Ambulatory referral to Orthopedics  5. Screening for diabetes mellitus - Comprehensive metabolic panel; Future - Comprehensive metabolic panel    Return in about 6 months (around 03/15/2021) for physical exam.    Micheline Rough, MD

## 2020-09-15 DIAGNOSIS — H2511 Age-related nuclear cataract, right eye: Secondary | ICD-10-CM | POA: Diagnosis not present

## 2020-09-15 NOTE — Addendum Note (Signed)
Addended by: Agnes Lawrence on: 09/15/2020 11:29 AM   Modules accepted: Orders

## 2020-09-20 ENCOUNTER — Other Ambulatory Visit (INDEPENDENT_AMBULATORY_CARE_PROVIDER_SITE_OTHER): Payer: Medicare Other

## 2020-09-20 ENCOUNTER — Other Ambulatory Visit: Payer: Self-pay | Admitting: Family Medicine

## 2020-09-20 ENCOUNTER — Other Ambulatory Visit: Payer: Self-pay

## 2020-09-20 ENCOUNTER — Other Ambulatory Visit: Payer: Medicare Other

## 2020-09-20 DIAGNOSIS — E875 Hyperkalemia: Secondary | ICD-10-CM | POA: Diagnosis not present

## 2020-09-20 LAB — BASIC METABOLIC PANEL
BUN: 23 mg/dL (ref 6–23)
CO2: 29 mEq/L (ref 19–32)
Calcium: 9.8 mg/dL (ref 8.4–10.5)
Chloride: 106 mEq/L (ref 96–112)
Creatinine, Ser: 1.06 mg/dL (ref 0.40–1.20)
GFR: 48.79 mL/min — ABNORMAL LOW (ref >60.00)
Glucose, Bld: 84 mg/dL (ref 70–99)
Potassium: 5.1 mEq/L (ref 3.5–5.1)
Sodium: 141 mEq/L (ref 135–145)

## 2020-11-03 DIAGNOSIS — Z23 Encounter for immunization: Secondary | ICD-10-CM | POA: Diagnosis not present

## 2020-12-23 DIAGNOSIS — M810 Age-related osteoporosis without current pathological fracture: Secondary | ICD-10-CM | POA: Diagnosis not present

## 2020-12-23 DIAGNOSIS — R5383 Other fatigue: Secondary | ICD-10-CM | POA: Diagnosis not present

## 2020-12-23 DIAGNOSIS — E559 Vitamin D deficiency, unspecified: Secondary | ICD-10-CM | POA: Diagnosis not present

## 2021-01-03 DIAGNOSIS — M810 Age-related osteoporosis without current pathological fracture: Secondary | ICD-10-CM | POA: Diagnosis not present

## 2021-01-03 DIAGNOSIS — M25511 Pain in right shoulder: Secondary | ICD-10-CM | POA: Diagnosis not present

## 2021-03-02 ENCOUNTER — Other Ambulatory Visit: Payer: Self-pay | Admitting: Family Medicine

## 2021-03-13 DIAGNOSIS — D631 Anemia in chronic kidney disease: Secondary | ICD-10-CM | POA: Diagnosis not present

## 2021-03-13 DIAGNOSIS — N1832 Chronic kidney disease, stage 3b: Secondary | ICD-10-CM | POA: Diagnosis not present

## 2021-03-13 DIAGNOSIS — N183 Chronic kidney disease, stage 3 unspecified: Secondary | ICD-10-CM | POA: Diagnosis not present

## 2021-03-13 DIAGNOSIS — N2581 Secondary hyperparathyroidism of renal origin: Secondary | ICD-10-CM | POA: Diagnosis not present

## 2021-03-15 ENCOUNTER — Encounter: Payer: Self-pay | Admitting: Family Medicine

## 2021-03-15 ENCOUNTER — Other Ambulatory Visit: Payer: Self-pay

## 2021-03-15 ENCOUNTER — Ambulatory Visit (INDEPENDENT_AMBULATORY_CARE_PROVIDER_SITE_OTHER): Payer: Medicare Other | Admitting: Family Medicine

## 2021-03-15 VITALS — BP 92/68 | HR 77 | Temp 98.3°F | Ht 65.5 in | Wt 159.9 lb

## 2021-03-15 DIAGNOSIS — G4700 Insomnia, unspecified: Secondary | ICD-10-CM | POA: Diagnosis not present

## 2021-03-15 DIAGNOSIS — Z23 Encounter for immunization: Secondary | ICD-10-CM

## 2021-03-15 DIAGNOSIS — L821 Other seborrheic keratosis: Secondary | ICD-10-CM | POA: Diagnosis not present

## 2021-03-15 DIAGNOSIS — E039 Hypothyroidism, unspecified: Secondary | ICD-10-CM | POA: Diagnosis not present

## 2021-03-15 DIAGNOSIS — E785 Hyperlipidemia, unspecified: Secondary | ICD-10-CM | POA: Diagnosis not present

## 2021-03-15 DIAGNOSIS — M810 Age-related osteoporosis without current pathological fracture: Secondary | ICD-10-CM | POA: Diagnosis not present

## 2021-03-15 DIAGNOSIS — D2362 Other benign neoplasm of skin of left upper limb, including shoulder: Secondary | ICD-10-CM | POA: Diagnosis not present

## 2021-03-15 LAB — CBC WITH DIFFERENTIAL/PLATELET
Basophils Absolute: 0.1 10*3/uL (ref 0.0–0.1)
Basophils Relative: 1.1 % (ref 0.0–3.0)
Eosinophils Absolute: 0.2 10*3/uL (ref 0.0–0.7)
Eosinophils Relative: 3.8 % (ref 0.0–5.0)
HCT: 37.7 % (ref 36.0–46.0)
Hemoglobin: 12.6 g/dL (ref 12.0–15.0)
Lymphocytes Relative: 30.3 % (ref 12.0–46.0)
Lymphs Abs: 2 10*3/uL (ref 0.7–4.0)
MCHC: 33.4 g/dL (ref 30.0–36.0)
MCV: 91.7 fl (ref 78.0–100.0)
Monocytes Absolute: 0.6 10*3/uL (ref 0.1–1.0)
Monocytes Relative: 9.1 % (ref 3.0–12.0)
Neutro Abs: 3.6 10*3/uL (ref 1.4–7.7)
Neutrophils Relative %: 55.7 % (ref 43.0–77.0)
Platelets: 237 10*3/uL (ref 150.0–400.0)
RBC: 4.11 Mil/uL (ref 3.87–5.11)
RDW: 13.6 % (ref 11.5–15.5)
WBC: 6.5 10*3/uL (ref 4.0–10.5)

## 2021-03-15 LAB — COMPREHENSIVE METABOLIC PANEL
ALT: 8 U/L (ref 0–35)
AST: 15 U/L (ref 0–37)
Albumin: 4.5 g/dL (ref 3.5–5.2)
Alkaline Phosphatase: 29 U/L — ABNORMAL LOW (ref 39–117)
BUN: 23 mg/dL (ref 6–23)
CO2: 27 mEq/L (ref 19–32)
Calcium: 9.7 mg/dL (ref 8.4–10.5)
Chloride: 106 mEq/L (ref 96–112)
Creatinine, Ser: 1.02 mg/dL (ref 0.40–1.20)
GFR: 50.92 mL/min — ABNORMAL LOW (ref >60.00)
Glucose, Bld: 94 mg/dL (ref 70–99)
Potassium: 5.1 mEq/L (ref 3.5–5.1)
Sodium: 140 mEq/L (ref 135–145)
Total Bilirubin: 0.6 mg/dL (ref 0.2–1.2)
Total Protein: 7.5 g/dL (ref 6.0–8.3)

## 2021-03-15 LAB — LIPID PANEL
Cholesterol: 210 mg/dL — ABNORMAL HIGH (ref 0–200)
HDL: 66.5 mg/dL (ref >39.00)
LDL Cholesterol: 129 mg/dL — ABNORMAL HIGH (ref 0–99)
NonHDL: 143.1
Total CHOL/HDL Ratio: 3
Triglycerides: 73 mg/dL (ref 0.0–149.0)
VLDL: 14.6 mg/dL (ref 0.0–40.0)

## 2021-03-15 LAB — VITAMIN D 25 HYDROXY (VIT D DEFICIENCY, FRACTURES): VITD: 58.75 ng/mL (ref 30.00–100.00)

## 2021-03-15 LAB — TSH: TSH: 1.62 u[IU]/mL (ref 0.35–5.50)

## 2021-03-15 MED ORDER — LUNESTA 1 MG PO TABS: 1.0000 mg | ORAL_TABLET | Freq: Every evening | ORAL | 0 refills | Status: DC | PRN

## 2021-03-15 NOTE — Patient Instructions (Signed)
[facility] in [place], [place] Address: [address], [place], [place] [address] Phone: [phone]

## 2021-03-15 NOTE — Progress Notes (Signed)
Nancy [name] DOB: 1935/09/26 Encounter date: 03/15/2021  This is a 85 y.o. female who presents for chronic condition follow up/exam  History of present illness/Additional concerns: Last visit with me was 08/2020. She was getting cataract surgery after that visit. Went well. Getting left eye done in jan. We referred to hand specialist due to trigger finger. Still tight, can't make a fist. Not painful. She didn't hear from referral.   Osteoporosis: prolia. Managed by Dr. Layne Benton. Has been on this for 4-5 years. Had improvement in bone density since starting this. she was referred to nephro for creat occasionally elevated. She saw nephro on Monday and will follow her yearly - saw Dr. Candiss Norse at [facility].     Has a couple spots on left hand she would like frozen - might be a little bigger; have been there a long time.   Hypothyroid: on synthroid 41mcg daily   Takes the claritin. Have been doing ok with these. just using claritin if needed.    Still hasn't been able to travel - didn't want to bother with airlines and delays.    Does follow with Dr. Philis Pique. Time for her to have a visit.   Not sleeping well. She has tried sleep medication but tend to make her more hyper. Trazodone didn't help at all. Took 100mg . [name] worked well for her but got expensive.   Past Medical History:  Diagnosis Date   Allergy    Avascular necrosis ([place])    RIGHT SHOULDER   Blood transfusion without reported diagnosis    With shoulder surgery, 2019   Chronic cough    saw several specialists per her report, NONE NOW   Closed 4-part fracture of proximal end of right humerus, with malunion, subsequent encounter with AVN 03/18/2018   Hypothyroidism    Osteoporosis    Sees Dr. Layne Benton at [place], on prolia   PONV (postoperative nausea and vomiting)    severe post op nausea and vomiting   Postoperative anemia due to acute blood loss 03/19/2018   Past Surgical History:  Procedure Laterality  Date   ABDOMINAL HYSTERECTOMY  1975   PARTIAL; had ovarian cysts. no cancer concerns.   APPENDECTOMY     LAPAROSCOPY     removal of ovaries   OOPHORECTOMY Bilateral 2007   removal growth thyroid  1998   REVERSE SHOULDER ARTHROPLASTY Right 03/18/2018   Procedure: REVERSE RIGHT SHOULDER ARTHROPLASTY;  Surgeon: Marchia Bond, MD;  Location: [facility];  Service: Orthopedics;  Laterality: Right;   TONSILLECTOMY     TRIGGER FINGER RELEASE Left 04/06/2019   Procedure: DUPUYTREN 'S AND CONTRACTURE RELEASE LEFT FOURTH FINGER;  Surgeon: Cristine Polio, MD;  Location: [facility];  Service: Plastics;  Laterality: Left;   Allergies  Allergen Reactions   Norco [Hydrocodone-Acetaminophen] Hives   Penicillins Hives    Has patient had a PCN reaction causing immediate rash, facial/tongue/throat swelling, SOB or lightheadedness with hypotension: yes Has patient had a PCN reaction causing severe rash involving mucus membranes or skin necrosis: no Has patient had a PCN reaction that required hospitalization: no Has patient had a PCN reaction occurring within the last 10 years: yes If all of the above answers are "NO", then may proceed with Cephalosporin use.    Sulfamethoxazole Hives   Current Meds  Medication Sig   CALCIUM PO Take 600 mg by mouth daily.   cholecalciferol (VITAMIN D3) 25 MCG (1000 UT) tablet Take 2,000 Units by mouth daily.  loratadine (CLARITIN) 10 MG tablet Take 10 mg by mouth as needed.   LUNESTA 1 MG TABS tablet Take 1 tablet (1 mg total) by mouth at bedtime as needed for sleep. Take immediately before bedtime   Multiple Vitamins-Minerals (ZINC PO) Take 50 mg by mouth.   Omega-3 Fatty Acids (FISH OIL PO) Take 1,200 mg by mouth.   PROLIA 60 MG/ML SOSY injection Inject into the skin once.   SYNTHROID 50 MCG tablet TAKE 1 TABLET BY MOUTH  DAILY   Current Facility-Administered Medications for the 03/15/21 encounter (Office Visit) with Caren Macadam, MD   Medication   0.9 %  sodium chloride infusion   Social History   Tobacco Use   Smoking status: Never   Smokeless tobacco: Never  Substance Use Topics   Alcohol use: Never   Family History  Problem Relation Age of Onset   Dementia Mother    Colon cancer Mother 96   Parkinsonism Father    Hypertension Father    Colon cancer Sister 81   Hypertension Sister    Stroke Sister    Osteoporosis Other    Stomach cancer Neg Hx    Colon polyps Neg Hx    Esophageal cancer Neg Hx    Rectal cancer Neg Hx      Review of Systems  Constitutional:  Negative for activity change, appetite change, chills, fatigue, fever and unexpected weight change.  HENT:  Negative for congestion, ear pain, hearing loss, sinus pressure, sinus pain, sore throat and trouble swallowing.   Eyes:  Negative for pain and visual disturbance.  Respiratory:  Negative for cough, chest tightness, shortness of breath and wheezing.   Cardiovascular:  Negative for chest pain, palpitations and leg swelling.  Gastrointestinal:  Negative for abdominal pain, blood in stool, constipation, diarrhea, nausea and vomiting.  Genitourinary:  Negative for difficulty urinating and menstrual problem.  Musculoskeletal:  Negative for arthralgias and back pain.       See hpi   Skin:  Negative for rash.  Neurological:  Negative for dizziness, weakness, numbness and headaches.  Hematological:  Negative for adenopathy. Does not bruise/bleed easily.  Psychiatric/Behavioral:  Negative for sleep disturbance and suicidal ideas. The patient is not nervous/anxious.      CBC:  Lab Results  Component Value Date   WBC 6.5 03/15/2021   HGB 12.6 03/15/2021   HCT 37.7 03/15/2021   MCH 30.6 03/14/2020   MCHC 33.4 03/15/2021   RDW 13.6 03/15/2021   PLT 237.0 03/15/2021   MPV 10.0 03/14/2020   CMP: Lab Results  Component Value Date   NA 140 03/15/2021   NA 139 12/02/2019   K 5.1 03/15/2021   CL 106 03/15/2021   CO2 27 03/15/2021    ANIONGAP 8 03/19/2018   GLUCOSE 94 03/15/2021   GLUCOSE 79 04/15/2006   BUN 23 03/15/2021   BUN 28 (A) 12/02/2019   CREATININE 1.02 03/15/2021   GFRAA 58 (L) 03/19/2018   CALCIUM 9.7 03/15/2021   PROT 7.5 03/15/2021   BILITOT 0.6 03/15/2021   ALKPHOS 29 (L) 03/15/2021   ALT 8 03/15/2021   AST 15 03/15/2021   LIPID: Lab Results  Component Value Date   CHOL 210 (H) 03/15/2021   TRIG 73.0 03/15/2021   TRIG 73 04/15/2006   HDL 66.50 03/15/2021   LDLCALC 129 (H) 03/15/2021    Objective:  BP 92/68 (BP Location: Left Arm, Patient Position: Sitting, Cuff Size: Large)   Pulse 77   Temp 98.3 F (36.8  C) (Oral)   Ht 5' 5.5" (1.664 m)   Wt 159 lb 14.4 oz (72.5 kg)   SpO2 99%   BMI 26.20 kg/m   Weight: 159 lb 14.4 oz (72.5 kg)   BP Readings from Last 3 Encounters:  03/15/21 92/68  09/12/20 110/70  05/17/20 120/76   Wt Readings from Last 3 Encounters:  03/15/21 159 lb 14.4 oz (72.5 kg)  09/12/20 159 lb 11.2 oz (72.4 kg)  05/17/20 162 lb 8 oz (73.7 kg)    Physical Exam Constitutional:      General: She is not in acute distress.    Appearance: She is well-developed.  HENT:     Head: Normocephalic and atraumatic.     Right Ear: External ear normal.     Left Ear: External ear normal.     Mouth/Throat:     Pharynx: No oropharyngeal exudate.  Eyes:     Conjunctiva/sclera: Conjunctivae normal.     Pupils: Pupils are equal, round, and reactive to light.  Neck:     Thyroid: No thyromegaly.  Cardiovascular:     Rate and Rhythm: Normal rate and regular rhythm.     Heart sounds: Normal heart sounds. No murmur heard.   No friction rub. No gallop.  Pulmonary:     Effort: Pulmonary effort is normal.     Breath sounds: Normal breath sounds.  Abdominal:     General: Bowel sounds are normal. There is no distension.     Palpations: Abdomen is soft. There is no mass.     Tenderness: There is no abdominal tenderness. There is no guarding.     Hernia: No hernia is present.   Musculoskeletal:        General: No tenderness or deformity. Normal range of motion.     Cervical back: Normal range of motion and neck supple.  Lymphadenopathy:     Cervical: No cervical adenopathy.  Skin:    General: Skin is warm and dry.     Findings: No rash.  Neurological:     Mental Status: She is alert and oriented to person, place, and time.     Deep Tendon Reflexes: Reflexes normal.     Reflex Scores:      Tricep reflexes are 2+ on the right side and 2+ on the left side.      Bicep reflexes are 2+ on the right side and 2+ on the left side.      Brachioradialis reflexes are 2+ on the right side and 2+ on the left side.      Patellar reflexes are 2+ on the right side and 2+ on the left side. Psychiatric:        Speech: Speech normal.        Behavior: Behavior normal.        Thought Content: Thought content normal.    Cryotherapy Procedure Note  Pre-operative Diagnosis: seborrheic keratosis  Post-operative Diagnosis: same  Locations: left dorsal hand x 2; each 0.25 in diameter  Procedure Details  Patient informed of the risks, including bleeding and infection, and benefits of the  procedure and Verbal informed consent obtained. Three cycles of liquid nitrogen applied to the area with 1-59mm perimeter with pause between cycles. Patient tolerated procedure well.  Complications: none.  Plan: 1. Discussed that there may be blister formation. Keep wound clean, dry. OK to apply antibiotic ointment if needed.  2. Warning signs of infection were reviewed.   3. Return in 2 weeks for re-treatment if lesion  persists.    Assessment/Plan: Health Maintenance Due  Topic Date Due   COVID-19 Vaccine (4 - Booster for Pfizer series) 09/18/2020   Health Maintenance reviewed.  1. Hypothyroidism, unspecified type Recheck blood work today.  Thyroid has been stable on current dose of medication. - CBC with Differential/Platelet; Future - TSH; Future - TSH - CBC with  Differential/Platelet  2. Osteoporosis, unspecified osteoporosis type, unspecified pathological fracture presence She takes 2000 units of vitamin D daily.  She is active and on Prolia to manage bone density. - VITAMIN D 25 Hydroxy (Vit-D Deficiency, Fractures); Future - VITAMIN D 25 Hydroxy (Vit-D Deficiency, Fractures)  3. Hyperlipidemia, unspecified hyperlipidemia type Cholesterols been diet controlled.  She does take fish oil supplement as well. - Comprehensive metabolic panel; Future - Lipid panel; Future - Lipid panel - Comprehensive metabolic panel  4. Insomnia, unspecified type Sleep is always been an issue for her.  She has not done well on generic brand Lunesta in the past.  She has tried trazodone, melatonin without relief.  We will see if she is able to get the [name] (uncertain about cost through insurance) and determine next step from there. - LUNESTA 1 MG TABS tablet; Take 1 tablet (1 mg total) by mouth at bedtime as needed for sleep. Take immediately before bedtime  Dispense: 30 tablet; Refill: 0  5. Need for immunization against influenza - Flu Vaccine QUAD High Dose(Fluad)  6.  Seborrheic keratoses Frozen in office today.  Return if these do not fully resolve or if they recur.   Return in about 6 months (around 09/12/2021) for Chronic condition visit.  Micheline Rough, MD

## 2021-03-16 ENCOUNTER — Other Ambulatory Visit: Payer: Self-pay | Admitting: Nephrology

## 2021-03-16 DIAGNOSIS — N183 Chronic kidney disease, stage 3 unspecified: Secondary | ICD-10-CM

## 2021-03-21 ENCOUNTER — Ambulatory Visit
Admission: RE | Admit: 2021-03-21 | Discharge: 2021-03-21 | Disposition: A | Discharge status: Home / Self Care | Payer: Medicare Other | Source: Ambulatory Visit | Attending: Nephrology | Admitting: Nephrology

## 2021-03-21 DIAGNOSIS — N281 Cyst of kidney, acquired: Secondary | ICD-10-CM | POA: Diagnosis not present

## 2021-03-21 DIAGNOSIS — N183 Chronic kidney disease, stage 3 unspecified: Secondary | ICD-10-CM

## 2021-03-24 DIAGNOSIS — Z1231 Encounter for screening mammogram for malignant neoplasm of breast: Secondary | ICD-10-CM | POA: Diagnosis not present

## 2021-03-29 DIAGNOSIS — M79642 Pain in left hand: Secondary | ICD-10-CM | POA: Diagnosis not present

## 2021-03-29 DIAGNOSIS — M720 Palmar fascial fibromatosis [Dupuytren]: Secondary | ICD-10-CM | POA: Diagnosis not present

## 2021-04-03 DIAGNOSIS — M25642 Stiffness of left hand, not elsewhere classified: Secondary | ICD-10-CM | POA: Diagnosis not present

## 2021-04-10 DIAGNOSIS — M25642 Stiffness of left hand, not elsewhere classified: Secondary | ICD-10-CM | POA: Diagnosis not present

## 2021-04-11 ENCOUNTER — Other Ambulatory Visit: Payer: Self-pay | Admitting: Nephrology

## 2021-04-11 DIAGNOSIS — N183 Chronic kidney disease, stage 3 unspecified: Secondary | ICD-10-CM

## 2021-04-17 DIAGNOSIS — Z23 Encounter for immunization: Secondary | ICD-10-CM | POA: Diagnosis not present

## 2021-04-17 DIAGNOSIS — M25642 Stiffness of left hand, not elsewhere classified: Secondary | ICD-10-CM | POA: Diagnosis not present

## 2021-04-24 DIAGNOSIS — M25642 Stiffness of left hand, not elsewhere classified: Secondary | ICD-10-CM | POA: Diagnosis not present

## 2021-05-05 DIAGNOSIS — M25642 Stiffness of left hand, not elsewhere classified: Secondary | ICD-10-CM | POA: Diagnosis not present

## 2021-05-24 DIAGNOSIS — M720 Palmar fascial fibromatosis [Dupuytren]: Secondary | ICD-10-CM | POA: Diagnosis not present

## 2021-05-30 ENCOUNTER — Telehealth: Payer: Self-pay | Admitting: Family Medicine

## 2021-05-30 ENCOUNTER — Ambulatory Visit: Payer: Medicare Other

## 2021-05-30 NOTE — Telephone Encounter (Signed)
Left message for patient to call back and schedule Medicare Annual Wellness Visit (AWV) either virtually or in office. Left  my Nancy [name] number [phone]   Last AWV 05/17/20  please schedule at anytime with [facility] Nurse Health Advisor 1 or 2   This should be a 45 minute visit.

## 2021-06-06 ENCOUNTER — Ambulatory Visit (INDEPENDENT_AMBULATORY_CARE_PROVIDER_SITE_OTHER): Payer: Medicare Other

## 2021-06-06 VITALS — Ht 65.5 in | Wt 160.0 lb

## 2021-06-06 DIAGNOSIS — Z0000 Encounter for general adult medical examination without abnormal findings: Secondary | ICD-10-CM | POA: Diagnosis not present

## 2021-06-06 NOTE — Progress Notes (Signed)
I connected with Nancy Nancy today by telephone and verified that I am speaking with the correct person using two identifiers. Location patient: home Location provider: work Persons participating in the virtual visit: Nancy Nancy, [place] LPN.   I discussed the limitations, risks, security and privacy concerns of performing an evaluation and management service by telephone and the availability of in person appointments. I also discussed with the patient that there may be a patient responsible charge related to this service. The patient expressed understanding and verbally consented to this telephonic visit.    Interactive audio and video telecommunications were attempted between this provider and patient, however failed, due to patient having technical difficulties OR patient did not have access to video capability.  We continued and completed visit with audio only.     Vital signs may be patient reported or missing.  Subjective:   Nancy Nancy is a 85 y.o. female who presents for Medicare Annual (Subsequent) preventive examination.  Review of Systems     Cardiac Risk Factors include: advanced age (>72men, >2 women);dyslipidemia     Objective:    Today's Vitals   06/06/21 0826  Weight: 160 lb (72.6 kg)  Height: 5' 5.5" (1.664 m)   Body mass index is 26.22 kg/m.  Advanced Directives 06/06/2021 05/17/2020 03/30/2019 03/18/2018 03/13/2018 02/14/2017 05/20/2014  Does Patient Have a Medical Advance Directive? Yes Yes Yes Yes Yes Yes No  Type of Paramedic of [place];Living will Living will;Out of facility DNR (pink MOST or yellow form) Nancy Nancy;Living will Nancy [name];Living will Nancy Nancy;Living will - -  Does patient want to make changes to medical advance directive? - - No - Patient declined No - Patient declined No - Patient declined - -  Copy of [place] in Chart? No  - copy requested - - No - copy requested No - copy requested - -  Would patient like information on creating a medical advance directive? - - - - - - No - patient declined information    Current Medications (verified) Outpatient Encounter Medications as of 06/06/2021  Medication Sig   CALCIUM PO Take 600 mg by mouth daily.   cholecalciferol (VITAMIN D3) 25 MCG (1000 UT) tablet Take 2,000 Units by mouth daily.    loratadine (CLARITIN) 10 MG tablet Take 10 mg by mouth as needed.   LUNESTA 1 MG TABS tablet Take 1 tablet (1 mg total) by mouth at bedtime as needed for sleep. Take immediately before bedtime   Multiple Vitamins-Minerals (ZINC PO) Take 50 mg by mouth.   Omega-3 Fatty Acids (FISH OIL PO) Take 1,200 mg by mouth.   PROLIA 60 MG/ML SOSY injection Inject into the skin once.   SYNTHROID 50 MCG tablet TAKE 1 TABLET BY MOUTH  DAILY   Facility-Administered Encounter Medications as of 06/06/2021  Medication   0.9 %  sodium chloride infusion    Allergies (verified) Norco [hydrocodone-acetaminophen], Penicillins, and Sulfamethoxazole   History: Past Medical History:  Diagnosis Date   Allergy    Avascular necrosis ([place])    RIGHT SHOULDER   Blood transfusion without reported diagnosis    With shoulder surgery, 2019   Chronic cough    saw several specialists per her report, NONE NOW   Closed 4-part fracture of proximal end of right humerus, with malunion, subsequent encounter with AVN 03/18/2018   Hypothyroidism    Osteoporosis    Sees Dr. Layne Benton at [place],  on prolia   PONV (postoperative nausea and vomiting)    severe post op nausea and vomiting   Postoperative anemia due to acute blood loss 03/19/2018   Past Surgical History:  Procedure Laterality Date   ABDOMINAL HYSTERECTOMY  1975   PARTIAL; had ovarian cysts. no cancer concerns.   APPENDECTOMY     CATARACT EXTRACTION Right    08/2020   LAPAROSCOPY     removal of ovaries   OOPHORECTOMY Bilateral 2007    removal growth thyroid  1998   REVERSE SHOULDER ARTHROPLASTY Right 03/18/2018   Procedure: REVERSE RIGHT SHOULDER ARTHROPLASTY;  Surgeon: Marchia Bond, MD;  [facility];  Service: Orthopedics;  Laterality: Right;   TONSILLECTOMY     TRIGGER FINGER RELEASE Left 04/06/2019   Procedure: DUPUYTREN 'S AND CONTRACTURE RELEASE LEFT FOURTH FINGER;  Surgeon: Cristine Polio, MD;  Location: [place];  Service: Plastics;  Laterality: Left;   Family History  Problem Relation Age of Onset   Dementia Mother    Colon cancer Mother 25   Parkinsonism Father    Hypertension Father    Colon cancer Sister 11   Hypertension Sister    Stroke Sister    Osteoporosis Other    Stomach cancer Neg Hx    Colon polyps Neg Hx    Esophageal cancer Neg Hx    Rectal cancer Neg Hx    Social History   Socioeconomic History   Marital status: Widowed    Spouse name: Not on file   Number of children: Not on file   Years of education: Not on file   Highest education level: Not on file  Occupational History   Occupation: retired  Tobacco Use   Smoking status: Never   Smokeless tobacco: Never  Vaping Use   Vaping Use: Never used  Substance and Sexual Activity   Alcohol use: Never   Drug use: Never   Sexual activity: Not Currently  Other Topics Concern   Not on file  Social History Narrative   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 Situation: lives alone, likes to travel      Spiritual Beliefs: Methodist      Lifestyle: regular walking, tries to eat healthy      Social Determinants of Radio broadcast assistant Strain: Low Risk    Difficulty of Paying Living Expenses: Not hard at all  Food Insecurity: No Food Insecurity   Worried About Charity fundraiser in the Last Year: Never true   Arboriculturist in the Last Year: Never true  Transportation Needs: No Transportation Needs   Lack of Transportation (Medical): No   Lack of Transportation (Non-Medical): No  Physical  Activity: Inactive   Days of Exercise per Week: 0 days   Minutes of Exercise per Session: 0 min  Stress: No Stress Concern Present   Feeling of Stress : Not at all  Social Connections: Not on file    Tobacco Counseling Counseling given: Not Answered   Clinical Intake:  Pre-visit preparation completed: Yes  Pain : No/denies pain     Nutritional Status: BMI 25 -29 Overweight Nutritional Risks: None Diabetes: No  How often do you need to have someone help you when you read instructions, pamphlets, or other written materials from your doctor or pharmacy?: 1 - Never What is the last grade level you completed in school?: some college  Diabetic? no  Interpreter Needed?: No  Information entered by :: NAllen LPN  Activities of Daily Living In your present state of health, do you have any difficulty performing the following activities: 06/06/2021  Hearing? N  Vision? N  Difficulty concentrating or making decisions? N  Walking or climbing stairs? N  Dressing or bathing? N  Doing errands, shopping? N  Preparing Food and eating ? N  Using the Toilet? N  In the past six months, have you accidently leaked urine? Y  Comment if held too long  Do you have problems with loss of bowel control? N  Managing your Medications? N  Managing your Finances? N  Housekeeping or managing your Housekeeping? N  Some recent data might be hidden    Patient Care Team: Caren Macadam, MD as PCP - General (Family Medicine) Bobbye Charleston, MD as Consulting Physician (Obstetrics and Gynecology) Ninetta Lights, MD (Inactive) as Consulting Physician (Orthopedic Surgery) Verner Chol, MD as Consulting Physician (Sports Medicine) Verner Chol, MD as Consulting Physician (Sports Medicine) Marchia Bond, MD as Consulting Physician (Orthopedic Surgery)  Indicate any recent Medical Services you may have received from other than [facility] providers in the past year (date may be  approximate).     Assessment:   This is a routine wellness examination for Mckenzye.  Hearing/Vision screen Vision Screening - Comments:: Regular eye exams, [facility]  Dietary issues and exercise activities discussed: Current Exercise Habits: Home exercise routine (works in yard)   Goals Addressed             This Visit's Progress    Patient Stated       06/06/2021, maintain       Depression Screen PHQ 2/9 Scores 06/06/2021 05/17/2020 03/14/2020 01/30/2018 02/14/2017 10/05/2015 04/29/2014  PHQ - 2 Score 0 0 0 0 0 0 0    Fall Risk Fall Risk  06/06/2021 03/15/2021 05/17/2020 01/19/2019 01/30/2018  Falls in the past year? 0 0 0 1 No  Comment - - - Emmi Telephone Survey: data to providers prior to load -  Number falls in past yr: - 0 0 1 -  Comment - - - Emmi Telephone Survey Actual Response = 11 -  Injury with Fall? - - 0 1 -  Risk for fall due to : Medication side effect - Impaired vision - -  Follow up Falls evaluation completed;Education provided;Falls prevention discussed - Falls prevention discussed - -    FALL RISK PREVENTION PERTAINING TO THE HOME:  Any stairs in or around the home? Yes  If so, are there any without handrails? No  Home free of loose throw rugs in walkways, pet beds, electrical cords, etc? Yes  Adequate lighting in your home to reduce risk of falls? Yes   ASSISTIVE DEVICES UTILIZED TO PREVENT FALLS:  Life alert? No  Use of a cane, walker or w/c? No  Grab bars in the bathroom? Yes  Shower chair or bench in shower? Yes  Elevated toilet seat or a handicapped toilet? Yes   TIMED UP AND GO:  Was the test performed? No .       Cognitive Function:     6CIT Screen 06/06/2021 05/17/2020  What Year? 0 points 0 points  What month? 0 points 0 points  What time? 0 points -  Count back from 20 0 points 0 points  Months in reverse 0 points 0 points  Repeat phrase 0 points 0 points  Total Score 0 -    Immunizations Immunization History   Administered Date(s) Administered   Fluad Quad(high Dose  65+) 03/14/2020, 03/15/2021   Influenza Whole 04/17/2007, 04/06/2010   Influenza, High Dose Seasonal PF 05/27/2015, 03/15/2016, 03/03/2018   Influenza,inj,Quad PF,6+ Mos 02/23/2013   Influenza-Unspecified 03/17/2014, 03/18/2017   PFIZER(Purple Top)SARS-COV-2 Vaccination 09/07/2019, 09/30/2019, 05/20/2020   Pfizer Covid-19 Vaccine Bivalent Booster 39yrs & up 04/17/2021   Pneumococcal Conjugate-13 05/27/2015   Pneumococcal Polysaccharide-23 04/14/2008, 11/01/2017   Td 06/18/2005, 03/18/2009   Tdap 01/17/2020   Zoster Recombinat (Shingrix) 09/16/2017, 07/09/2018   Zoster, Live 04/27/2008    TDAP status: Up to date  Flu Vaccine status: Up to date  Pneumococcal vaccine status: Up to date  Covid-19 vaccine status: Completed vaccines  Qualifies for Shingles Vaccine? Yes   Zostavax completed Yes   Shingrix Completed?: Yes  Screening Tests Health Maintenance  Topic Date Due   TETANUS/TDAP  01/16/2030   Pneumonia Vaccine 84+ Years old  Completed   INFLUENZA VACCINE  Completed   DEXA SCAN  Completed   COVID-19 Vaccine  Completed   Zoster Vaccines- Shingrix  Completed   HPV VACCINES  Aged Out    Health Maintenance  There are no preventive care reminders to display for this patient.   Colorectal cancer screening: No longer required.   Mammogram status: Completed 2022. Repeat every year  Bone Density status: Completed 07/12/2020.   Lung Cancer Screening: (Low Dose CT Chest recommended if Age 58-80 years, 30 pack-year currently smoking OR have quit w/in 15years.) does not qualify.   Lung Cancer Screening Referral: no  Additional Screening:  Hepatitis C Screening: does not qualify;   Vision Screening: Recommended annual ophthalmology exams for early detection of glaucoma and other disorders of the eye. Is the patient up to date with their annual eye exam?  Yes  Who is the provider or what is the name of the office  in which the patient attends annual eye exams? [facility] If pt is not established with a provider, would they like to be referred to a provider to establish care? No .   Dental Screening: Recommended annual dental exams for proper oral hygiene  Community Resource Referral / Chronic Care Management: CRR required this visit?  No   CCM required this visit?  No      Plan:     I have personally reviewed and noted the following in the patients chart:   Medical and social history Use of alcohol, tobacco or illicit drugs  Current medications and supplements including opioid prescriptions.  Functional ability and status Nutritional status Physical activity Advanced directives List of other physicians Hospitalizations, surgeries, and ER visits in previous 12 months Vitals Screenings to include cognitive, depression, and falls Referrals and appointments  In addition, I have reviewed and discussed with patient certain preventive protocols, quality metrics, and best practice recommendations. A written personalized care plan for preventive services as well as general preventive health recommendations were provided to patient.     Kellie Simmering, LPN   [id number]   Nurse Notes: none

## 2021-06-06 NOTE — Patient Instructions (Signed)
Nancy [name] , Thank you for taking time to come for your Medicare Wellness Visit. I appreciate your ongoing commitment to your health goals. Please review the following plan we discussed and let me know if I can assist you in the future.   Screening recommendations/referrals: Colonoscopy: not required Mammogram: completed 2022 Bone Density: completed 07/12/2020 Recommended yearly ophthalmology/optometry visit for glaucoma screening and checkup Recommended yearly dental visit for hygiene and checkup  Vaccinations: Influenza vaccine: completed 03/15/2021 Pneumococcal vaccine: completed 11/01/2017 Tdap vaccine: completed 01/17/2020, due 01/16/2030 Shingles vaccine: completed   Covid-19: 04/17/2021, 05/20/2020, 09/30/2019, 09/07/2019  Advanced directives: Please bring a copy of your POA (Power of Attorney) and/or Living Will to your next appointment.   Conditions/risks identified: none  Next appointment: Follow up in one year for your annual wellness visit    Preventive Care 65 Years and Older, Female Preventive care refers to lifestyle choices and visits with your health care provider that can promote health and wellness. What does preventive care include? A yearly physical exam. This is also called an annual well check. Dental exams once or twice a year. Routine eye exams. Ask your health care provider how often you should have your eyes checked. Personal lifestyle choices, including: Daily care of your teeth and gums. Regular physical activity. Eating a healthy diet. Avoiding tobacco and drug use. Limiting alcohol use. Practicing safe sex. Taking low-dose aspirin every day. Taking vitamin and mineral supplements as recommended by your health care provider. What happens during an annual well check? The services and screenings done by your health care provider during your annual well check will depend on your age, overall health, lifestyle risk factors, and family history of  disease. Counseling  Your health care provider may ask you questions about your: Alcohol use. Tobacco use. Drug use. Emotional well-being. Home and relationship well-being. Sexual activity. Eating habits. History of falls. Memory and ability to understand (cognition). Work and work Statistician. Reproductive health. Screening  You may have the following tests or measurements: Height, weight, and BMI. Blood pressure. Lipid and cholesterol levels. These may be checked every 5 years, or more frequently if you are over 68 years old. Skin check. Lung cancer screening. You may have this screening every year starting at age 67 if you have a 30-pack-year history of smoking and currently smoke or have quit within the past 15 years. Fecal occult blood test (FOBT) of the stool. You may have this test every year starting at age 62. Flexible sigmoidoscopy or colonoscopy. You may have a sigmoidoscopy every 5 years or a colonoscopy every 10 years starting at age 34. Hepatitis C blood test. Hepatitis B blood test. Sexually transmitted disease (STD) testing. Diabetes screening. This is done by checking your blood sugar (glucose) after you have not eaten for a while (fasting). You may have this done every 1-3 years. Bone density scan. This is done to screen for osteoporosis. You may have this done starting at age 5. Mammogram. This may be done every 1-2 years. Talk to your health care provider about how often you should have regular mammograms. Talk with your health care provider about your test results, treatment options, and if necessary, the need for more tests. Vaccines  Your health care provider may recommend certain vaccines, such as: Influenza vaccine. This is recommended every year. Tetanus, diphtheria, and acellular pertussis (Tdap, Td) vaccine. You may need a Td booster every 10 years. Zoster vaccine. You may need this after age 70. Pneumococcal 13-valent conjugate (PCV13) vaccine. One  dose is recommended after age 73. Pneumococcal polysaccharide (PPSV23) vaccine. One dose is recommended after age 8. Talk to your health care provider about which screenings and vaccines you need and how often you need them. This information is not intended to replace advice given to you by your health care provider. Make sure you discuss any questions you have with your health care provider. Document Released: 07/01/2015 Document Revised: 02/22/2016 Document Reviewed: 04/05/2015 Elsevier Interactive Patient Education  2017 [place] Prevention in the Home Falls can cause injuries. They can happen to people of all ages. There are many things you can do to make your home safe and to help prevent falls. What can I do on the outside of my home? Regularly fix the edges of walkways and driveways and fix any cracks. Remove anything that might make you trip as you walk through a door, such as a raised step or threshold. Trim any bushes or trees on the path to your home. Use bright outdoor lighting. Clear any walking paths of anything that might make someone trip, such as rocks or tools. Regularly check to see if handrails are loose or broken. Make sure that both sides of any steps have handrails. Any raised decks and porches should have guardrails on the edges. Have any leaves, snow, or ice cleared regularly. Use sand or salt on walking paths during winter. Clean up any spills in your garage right away. This includes oil or grease spills. What can I do in the bathroom? Use night lights. Install grab bars by the toilet and in the tub and shower. Do not use towel bars as grab bars. Use non-skid mats or decals in the tub or shower. If you need to sit down in the shower, use a plastic, non-slip stool. Keep the floor dry. Clean up any water that spills on the floor as soon as it happens. Remove soap buildup in the tub or shower regularly. Attach bath mats securely with double-sided  non-slip rug tape. Do not have throw rugs and other things on the floor that can make you trip. What can I do in the bedroom? Use night lights. Make sure that you have a light by your bed that is easy to reach. Do not use any sheets or blankets that are too big for your bed. They should not hang down onto the floor. Have a firm chair that has side arms. You can use this for support while you get dressed. Do not have throw rugs and other things on the floor that can make you trip. What can I do in the kitchen? Clean up any spills right away. Avoid walking on wet floors. Keep items that you use a lot in easy-to-reach places. If you need to reach something above you, use a strong step stool that has a grab bar. Keep electrical cords out of the way. Do not use floor polish or wax that makes floors slippery. If you must use wax, use non-skid floor wax. Do not have throw rugs and other things on the floor that can make you trip. What can I do with my stairs? Do not leave any items on the stairs. Make sure that there are handrails on both sides of the stairs and use them. Fix handrails that are broken or loose. Make sure that handrails are as long as the stairways. Check any carpeting to make sure that it is firmly attached to the stairs. Fix any carpet that is loose or worn. Avoid  having throw rugs at the top or bottom of the stairs. If you do have throw rugs, attach them to the floor with carpet tape. Make sure that you have a light switch at the top of the stairs and the bottom of the stairs. If you do not have them, ask someone to add them for you. What else can I do to help prevent falls? Wear shoes that: Do not have high heels. Have rubber bottoms. Are comfortable and fit you well. Are closed at the toe. Do not wear sandals. If you use a stepladder: Make sure that it is fully opened. Do not climb a closed stepladder. Make sure that both sides of the stepladder are locked into place. Ask  someone to hold it for you, if possible. Clearly mark and make sure that you can see: Any grab bars or handrails. First and last steps. Where the edge of each step is. Use tools that help you move around (mobility aids) if they are needed. These include: Canes. Walkers. Scooters. Crutches. Turn on the lights when you go into a dark area. Replace any light bulbs as soon as they burn out. Set up your furniture so you have a clear path. Avoid moving your furniture around. If any of your floors are uneven, fix them. If there are any pets around you, be aware of where they are. Review your medicines with your doctor. Some medicines can make you feel dizzy. This can increase your chance of falling. Ask your doctor what other things that you can do to help prevent falls. This information is not intended to replace advice given to you by your health care provider. Make sure you discuss any questions you have with your health care provider. Document Released: 03/31/2009 Document Revised: 11/10/2015 Document Reviewed: 07/09/2014 Elsevier Interactive Patient Education  2017 [employer].

## 2021-06-22 DIAGNOSIS — E559 Vitamin D deficiency, unspecified: Secondary | ICD-10-CM | POA: Diagnosis not present

## 2021-06-22 DIAGNOSIS — M810 Age-related osteoporosis without current pathological fracture: Secondary | ICD-10-CM | POA: Diagnosis not present

## 2021-06-22 DIAGNOSIS — R5383 Other fatigue: Secondary | ICD-10-CM | POA: Diagnosis not present

## 2021-07-05 DIAGNOSIS — H52202 Unspecified astigmatism, left eye: Secondary | ICD-10-CM | POA: Diagnosis not present

## 2021-07-05 DIAGNOSIS — H2513 Age-related nuclear cataract, bilateral: Secondary | ICD-10-CM | POA: Diagnosis not present

## 2021-07-11 DIAGNOSIS — M810 Age-related osteoporosis without current pathological fracture: Secondary | ICD-10-CM | POA: Diagnosis not present

## 2021-07-11 DIAGNOSIS — E559 Vitamin D deficiency, unspecified: Secondary | ICD-10-CM | POA: Diagnosis not present

## 2021-07-20 DIAGNOSIS — H2512 Age-related nuclear cataract, left eye: Secondary | ICD-10-CM | POA: Diagnosis not present

## 2021-07-24 ENCOUNTER — Other Ambulatory Visit: Payer: Self-pay | Admitting: Nephrology

## 2021-07-24 DIAGNOSIS — N183 Chronic kidney disease, stage 3 unspecified: Secondary | ICD-10-CM

## 2021-07-31 ENCOUNTER — Ambulatory Visit
Admission: RE | Admit: 2021-07-31 | Discharge: 2021-07-31 | Disposition: A | Discharge status: Home / Self Care | Payer: Medicare Other | Source: Ambulatory Visit | Attending: Nephrology | Admitting: Nephrology

## 2021-07-31 DIAGNOSIS — N183 Chronic kidney disease, stage 3 unspecified: Secondary | ICD-10-CM

## 2021-07-31 DIAGNOSIS — N281 Cyst of kidney, acquired: Secondary | ICD-10-CM | POA: Diagnosis not present

## 2021-08-21 ENCOUNTER — Other Ambulatory Visit: Payer: Self-pay | Admitting: Nephrology

## 2021-08-21 DIAGNOSIS — N281 Cyst of kidney, acquired: Secondary | ICD-10-CM

## 2021-08-22 ENCOUNTER — Other Ambulatory Visit: Payer: Self-pay | Admitting: Family Medicine

## 2021-09-03 ENCOUNTER — Other Ambulatory Visit: Payer: Self-pay

## 2021-09-03 ENCOUNTER — Ambulatory Visit
Admission: RE | Admit: 2021-09-03 | Discharge: 2021-09-03 | Disposition: A | Discharge status: Home / Self Care | Payer: Medicare Other | Source: Ambulatory Visit | Attending: Nephrology | Admitting: Nephrology

## 2021-09-03 DIAGNOSIS — N281 Cyst of kidney, acquired: Secondary | ICD-10-CM

## 2021-09-03 DIAGNOSIS — K7689 Other specified diseases of liver: Secondary | ICD-10-CM | POA: Diagnosis not present

## 2021-09-03 DIAGNOSIS — K8020 Calculus of gallbladder without cholecystitis without obstruction: Secondary | ICD-10-CM | POA: Diagnosis not present

## 2021-09-03 DIAGNOSIS — K449 Diaphragmatic hernia without obstruction or gangrene: Secondary | ICD-10-CM | POA: Diagnosis not present

## 2021-09-03 MED ORDER — GADOBENATE DIMEGLUMINE 529 MG/ML IV SOLN
15.0000 mL | Freq: Once | INTRAVENOUS | Status: AC | PRN
  Administered 2021-09-03: 15 mL via INTRAVENOUS

## 2021-09-13 ENCOUNTER — Encounter: Payer: Self-pay | Admitting: Family Medicine

## 2021-09-13 ENCOUNTER — Ambulatory Visit (INDEPENDENT_AMBULATORY_CARE_PROVIDER_SITE_OTHER): Payer: Medicare Other | Admitting: Family Medicine

## 2021-09-13 VITALS — BP 100/70 | HR 85 | Temp 98.1°F | Ht 65.5 in | Wt 163.1 lb

## 2021-09-13 DIAGNOSIS — E875 Hyperkalemia: Secondary | ICD-10-CM | POA: Diagnosis not present

## 2021-09-13 DIAGNOSIS — E785 Hyperlipidemia, unspecified: Secondary | ICD-10-CM

## 2021-09-13 DIAGNOSIS — K7689 Other specified diseases of liver: Secondary | ICD-10-CM

## 2021-09-13 DIAGNOSIS — M810 Age-related osteoporosis without current pathological fracture: Secondary | ICD-10-CM

## 2021-09-13 DIAGNOSIS — L821 Other seborrheic keratosis: Secondary | ICD-10-CM

## 2021-09-13 DIAGNOSIS — K862 Cyst of pancreas: Secondary | ICD-10-CM

## 2021-09-13 DIAGNOSIS — E039 Hypothyroidism, unspecified: Secondary | ICD-10-CM | POA: Diagnosis not present

## 2021-09-13 DIAGNOSIS — D2362 Other benign neoplasm of skin of left upper limb, including shoulder: Secondary | ICD-10-CM | POA: Diagnosis not present

## 2021-09-13 LAB — CBC WITH DIFFERENTIAL/PLATELET
Basophils Absolute: 0.1 10*3/uL (ref 0.0–0.1)
Basophils Relative: 1 % (ref 0.0–3.0)
Eosinophils Absolute: 0.3 10*3/uL (ref 0.0–0.7)
Eosinophils Relative: 4.3 % (ref 0.0–5.0)
HCT: 38 % (ref 36.0–46.0)
Hemoglobin: 12.6 g/dL (ref 12.0–15.0)
Lymphocytes Relative: 34.5 % (ref 12.0–46.0)
Lymphs Abs: 2.5 10*3/uL (ref 0.7–4.0)
MCHC: 33.1 g/dL (ref 30.0–36.0)
MCV: 92.4 fl (ref 78.0–100.0)
Monocytes Absolute: 0.6 10*3/uL (ref 0.1–1.0)
Monocytes Relative: 8.4 % (ref 3.0–12.0)
Neutro Abs: 3.7 10*3/uL (ref 1.4–7.7)
Neutrophils Relative %: 51.8 % (ref 43.0–77.0)
Platelets: 247 10*3/uL (ref 150.0–400.0)
RBC: 4.11 Mil/uL (ref 3.87–5.11)
RDW: 13.7 % (ref 11.5–15.5)
WBC: 7.2 10*3/uL (ref 4.0–10.5)

## 2021-09-13 LAB — LIPID PANEL
Cholesterol: 218 mg/dL — ABNORMAL HIGH (ref 0–200)
HDL: 62.3 mg/dL (ref >39.00)
LDL Cholesterol: 138 mg/dL — ABNORMAL HIGH (ref 0–99)
NonHDL: 156.03
Total CHOL/HDL Ratio: 4
Triglycerides: 92 mg/dL (ref 0.0–149.0)
VLDL: 18.4 mg/dL (ref 0.0–40.0)

## 2021-09-13 LAB — COMPREHENSIVE METABOLIC PANEL
ALT: 9 U/L (ref 0–35)
AST: 17 U/L (ref 0–37)
Albumin: 4.5 g/dL (ref 3.5–5.2)
Alkaline Phosphatase: 51 U/L (ref 39–117)
BUN: 28 mg/dL — ABNORMAL HIGH (ref 6–23)
CO2: 24 mEq/L (ref 19–32)
Calcium: 9.8 mg/dL (ref 8.4–10.5)
Chloride: 106 mEq/L (ref 96–112)
Creatinine, Ser: 1.16 mg/dL (ref 0.40–1.20)
GFR: 43.48 mL/min — ABNORMAL LOW (ref >60.00)
Glucose, Bld: 93 mg/dL (ref 70–99)
Potassium: 5.3 mEq/L — ABNORMAL HIGH (ref 3.5–5.1)
Sodium: 141 mEq/L (ref 135–145)
Total Bilirubin: 0.5 mg/dL (ref 0.2–1.2)
Total Protein: 7.4 g/dL (ref 6.0–8.3)

## 2021-09-13 LAB — TSH: TSH: 2.04 u[IU]/mL (ref 0.35–5.50)

## 2021-09-13 MED ORDER — ESZOPICLONE 3 MG PO TABS: 3.0000 mg | ORAL_TABLET | Freq: Every evening | ORAL | 3 refills | Status: AC | PRN

## 2021-09-13 NOTE — Progress Notes (Signed)
?Nancy [name] ?DOB: 1935/08/13 ?Encounter date: 09/13/2021 ? ?This is a 86 y.o. female who presents with ?Chief Complaint  ?Patient presents with  ? Follow-up  ? ? ?History of present illness: ?Last visit with me was 03/15/2021.  We re-referred her to hand specialist at last visit due to some difficulty with recovery from prior trigger finger surgery. ? ?Osteoporosis: Prolia.  Managed by Dr. Layne Benton. Her creatinine was elevated when last checked and was sent to kidney specialist for eval. Had 2 [place] - cyst on right kidney. There was incidental liver cysts and small pancreatic cysts and all will be re-imaged in 1 year with MRI. She states that this is being managed by Dr. Candiss Norse.  ? ?Has 3 more seborrheic places. One went away, other didn't. Third new spot that has come up. Would like those sprayed. All on hands/wrist. ? ?Hx of bulging disc. Hadn't had any pain for years until yesterday - aching in right buttock and could tell it was same pain.  ? ?Sleep is still not good. Tried '1mg'$  lunesta, then '2mg'$  and no sleep at all. '3mg'$  she did sleep. She will be sleepy, she cuts off lights and does reading, but will lay in bed hours at a time without being able to fall asleep. Doesn't want to take every night, but would like rx for the '3mg'$ . Trouble with both falling and staying asleep. Wakes every 2 hours to go to the bathroom too.  ? ?Did follow with hand surgeon and was sent to therapy and did several sessions - he was worried that doing surgery would affect nerve. Better after therapy, but not quite where she would want it, but she is ok with status at this point.  ? ?Hypothyroid: Synthroid 50 mcg daily. She has large supply of 63mg left at home if needed. ?Follows regularly with Dr. HPhilis Piquefor GYN care ? ? ?Allergies  ?Allergen Reactions  ? Norco [Hydrocodone-Acetaminophen] Hives  ? Penicillins Hives  ?  Has patient had a PCN reaction causing immediate rash, facial/tongue/throat swelling, SOB or lightheadedness with  hypotension: yes ?Has patient had a PCN reaction causing severe rash involving mucus membranes or skin necrosis: no ?Has patient had a PCN reaction that required hospitalization: no ?Has patient had a PCN reaction occurring within the last 10 years: yes ?If all of the above answers are "NO", then may proceed with Cephalosporin use. ?  ? Sulfamethoxazole Hives  ? ?Current Meds  ?Medication Sig  ? CALCIUM PO Take 600 mg by mouth daily.  ? cholecalciferol (VITAMIN D3) 25 MCG (1000 UT) tablet Take 2,000 Units by mouth daily.   ? eszopiclone 3 MG TABS Take 1 tablet (3 mg total) by mouth at bedtime as needed. Take immediately before bedtime  ? loratadine (CLARITIN) 10 MG tablet Take 10 mg by mouth as needed.  ? Multiple Vitamins-Minerals (ZINC PO) Take 50 mg by mouth.  ? Omega-3 Fatty Acids (FISH OIL PO) Take 1,200 mg by mouth.  ? PROLIA 60 MG/ML SOSY injection Inject into the skin once.  ? SYNTHROID 50 MCG tablet TAKE 1 TABLET BY MOUTH  DAILY  ? [DISCONTINUED] LUNESTA 1 MG TABS tablet Take 1 tablet (1 mg total) by mouth at bedtime as needed for sleep. Take immediately before bedtime  ? ?Current Facility-Administered Medications for the 09/13/21 encounter (Office Visit) with KCaren Macadam MD  ?Medication  ? 0.9 %  sodium chloride infusion  ? ? ?Review of Systems  ?Constitutional:  Negative for chills, fatigue and  fever.  ?Respiratory:  Negative for cough, chest tightness, shortness of breath and wheezing.   ?Cardiovascular:  Negative for chest pain, palpitations and leg swelling.  ?Skin:   ?     See hpi ?  ? ?Objective: ? ?BP 100/70 (BP Location: Left Arm, Patient Position: Sitting, Cuff Size: Normal)   Pulse 85   Temp 98.1 ?F (36.7 ?C) (Oral)   Ht 5' 5.5" (1.664 m)   Wt 163 lb 1.6 oz (74 kg)   SpO2 99%   BMI 26.73 kg/m?   Weight: 163 lb 1.6 oz (74 kg)  ? ?BP Readings from Last 3 Encounters:  ?09/13/21 100/70  ?03/15/21 92/68  ?09/12/20 110/70  ? ?Wt Readings from Last 3 Encounters:  ?09/13/21 163 lb 1.6 oz  (74 kg)  ?06/06/21 160 lb (72.6 kg)  ?03/15/21 159 lb 14.4 oz (72.5 kg)  ? ?Physical Exam ?Vitals reviewed.  ?Constitutional:   ?   Appearance: Normal appearance.  ?HENT:  ?   Head: Normocephalic and atraumatic.  ?Cardiovascular:  ?   Rate and Rhythm: Normal rate and regular rhythm.  ?   Pulses: Normal pulses.  ?Pulmonary:  ?   Effort: Pulmonary effort is normal.  ?   Breath sounds: Normal breath sounds.  ?Skin: ?   Comments: See skin procedure note  ?Neurological:  ?   Mental Status: She is alert.  ? ? ? ? ?Cryotherapy Procedure Note ?  ?Pre-operative Diagnosis: seborrheic keratosis ?  ?Post-operative Diagnosis: same ?  ?Locations: left dorsal ring finger x1 each 0.25 in diameter, right dorsal wrist 0.5cm lesion ?  ?Procedure Details  ?Patient informed of the risks, including bleeding and infection, and benefits of the  ?procedure and Verbal informed consent obtained. Three cycles of liquid nitrogen applied to the area with 1-22m perimeter with pause between cycles. Patient tolerated procedure well. ?  ?Complications: ?none. ?  ?Plan: ?1. Discussed that there may be blister formation. Keep wound clean, dry. OK to apply antibiotic ointment if needed.  ?2. Warning signs of infection were reviewed.   ?3. Return in 2 weeks for re-treatment if lesion persists. ? ?Assessment/Plan ? ? ?1. Pancreatic cyst ?She will need follow-up MRI in a year.  This is being managed by Dr. SCandiss Norse ? ?2. Liver cyst ?See above. ? ?3. Hypothyroidism, unspecified type ?Has been well controlled.  Rechecking blood work today. ?- CBC with Differential/Platelet; Future ?- TSH; Future ? ?4. Osteoporosis, unspecified osteoporosis type, unspecified pathological fracture presence ?Following with Dr. BLayne Benton  On Prolia. ? ?5. Hyperlipidemia, unspecified hyperlipidemia type ?Has been diet controlled. ?- Comprehensive metabolic panel; Future ?- Lipid panel; Future ? ?6. Seborrheic keratosis ?Treated in office today. ?- PR DESTRUCTION BENIGN LESIONS UP  TO 14 ? ?7. Benign neoplasm of skin of left hand ?- PR DESTRUCTION BENIGN LESIONS UP TO 14 ? ?Return in about 6 months (around 03/16/2022) for physical exam. ? ? ? ? ?JMicheline Rough MD ?

## 2021-09-14 NOTE — Addendum Note (Signed)
Addended by: Agnes Lawrence on: 09/14/2021 04:31 PM ? ? Modules accepted: Orders ? ?

## 2021-09-29 ENCOUNTER — Other Ambulatory Visit (INDEPENDENT_AMBULATORY_CARE_PROVIDER_SITE_OTHER): Payer: Medicare Other

## 2021-09-29 DIAGNOSIS — E875 Hyperkalemia: Secondary | ICD-10-CM

## 2021-09-29 LAB — BASIC METABOLIC PANEL
BUN: 29 mg/dL — ABNORMAL HIGH (ref 6–23)
CO2: 27 mEq/L (ref 19–32)
Calcium: 9.3 mg/dL (ref 8.4–10.5)
Chloride: 103 mEq/L (ref 96–112)
Creatinine, Ser: 1.15 mg/dL (ref 0.40–1.20)
GFR: 43.92 mL/min — ABNORMAL LOW (ref >60.00)
Glucose, Bld: 87 mg/dL (ref 70–99)
Potassium: 5.3 mEq/L — ABNORMAL HIGH (ref 3.5–5.1)
Sodium: 136 mEq/L (ref 135–145)

## 2021-10-11 ENCOUNTER — Other Ambulatory Visit: Payer: Self-pay | Admitting: Family Medicine

## 2021-10-11 DIAGNOSIS — E875 Hyperkalemia: Secondary | ICD-10-CM

## 2021-11-03 ENCOUNTER — Other Ambulatory Visit (INDEPENDENT_AMBULATORY_CARE_PROVIDER_SITE_OTHER): Payer: Medicare Other

## 2021-11-03 DIAGNOSIS — E875 Hyperkalemia: Secondary | ICD-10-CM

## 2021-11-03 LAB — BASIC METABOLIC PANEL
BUN: 21 mg/dL (ref 6–23)
CO2: 27 mEq/L (ref 19–32)
Calcium: 9.5 mg/dL (ref 8.4–10.5)
Chloride: 103 mEq/L (ref 96–112)
Creatinine, Ser: 1.09 mg/dL (ref 0.40–1.20)
GFR: 46.81 mL/min — ABNORMAL LOW (ref >60.00)
Glucose, Bld: 90 mg/dL (ref 70–99)
Potassium: 4.9 mEq/L (ref 3.5–5.1)
Sodium: 137 mEq/L (ref 135–145)

## 2022-01-09 DIAGNOSIS — M79661 Pain in right lower leg: Secondary | ICD-10-CM | POA: Diagnosis not present

## 2022-01-09 DIAGNOSIS — M810 Age-related osteoporosis without current pathological fracture: Secondary | ICD-10-CM | POA: Diagnosis not present

## 2022-02-20 ENCOUNTER — Other Ambulatory Visit: Payer: Self-pay | Admitting: Nephrology

## 2022-02-20 DIAGNOSIS — N183 Chronic kidney disease, stage 3 unspecified: Secondary | ICD-10-CM

## 2022-02-20 DIAGNOSIS — N281 Cyst of kidney, acquired: Secondary | ICD-10-CM

## 2022-02-22 ENCOUNTER — Ambulatory Visit
Admission: RE | Admit: 2022-02-22 | Discharge: 2022-02-22 | Disposition: A | Discharge status: Home / Self Care | Payer: Medicare Other | Source: Ambulatory Visit | Attending: Nephrology | Admitting: Nephrology

## 2022-02-22 DIAGNOSIS — N281 Cyst of kidney, acquired: Secondary | ICD-10-CM | POA: Diagnosis not present

## 2022-02-22 DIAGNOSIS — N200 Calculus of kidney: Secondary | ICD-10-CM | POA: Diagnosis not present

## 2022-02-22 DIAGNOSIS — N189 Chronic kidney disease, unspecified: Secondary | ICD-10-CM | POA: Diagnosis not present

## 2022-02-22 DIAGNOSIS — N183 Chronic kidney disease, stage 3 unspecified: Secondary | ICD-10-CM

## 2022-02-27 ENCOUNTER — Other Ambulatory Visit: Payer: Self-pay | Admitting: *Deleted

## 2022-02-28 MED ORDER — LEVOTHYROXINE SODIUM 50 MCG PO TABS
50.0000 ug | ORAL_TABLET | Freq: Every day | ORAL | 1 refills | Status: DC
Start: 2022-02-28 — End: 2022-09-03

## 2022-03-07 DIAGNOSIS — N183 Chronic kidney disease, stage 3 unspecified: Secondary | ICD-10-CM | POA: Diagnosis not present

## 2022-03-13 DIAGNOSIS — N2581 Secondary hyperparathyroidism of renal origin: Secondary | ICD-10-CM | POA: Diagnosis not present

## 2022-03-13 DIAGNOSIS — D631 Anemia in chronic kidney disease: Secondary | ICD-10-CM | POA: Diagnosis not present

## 2022-03-13 DIAGNOSIS — N183 Chronic kidney disease, stage 3 unspecified: Secondary | ICD-10-CM | POA: Diagnosis not present

## 2022-03-13 DIAGNOSIS — N281 Cyst of kidney, acquired: Secondary | ICD-10-CM | POA: Diagnosis not present

## 2022-03-13 DIAGNOSIS — E875 Hyperkalemia: Secondary | ICD-10-CM | POA: Diagnosis not present

## 2022-03-13 DIAGNOSIS — K862 Cyst of pancreas: Secondary | ICD-10-CM | POA: Diagnosis not present

## 2022-03-27 DIAGNOSIS — Z23 Encounter for immunization: Secondary | ICD-10-CM | POA: Diagnosis not present

## 2022-04-10 DIAGNOSIS — Z01419 Encounter for gynecological examination (general) (routine) without abnormal findings: Secondary | ICD-10-CM | POA: Diagnosis not present

## 2022-04-10 DIAGNOSIS — Z1231 Encounter for screening mammogram for malignant neoplasm of breast: Secondary | ICD-10-CM | POA: Diagnosis not present

## 2022-04-10 LAB — HM MAMMOGRAPHY

## 2022-04-12 DIAGNOSIS — Z23 Encounter for immunization: Secondary | ICD-10-CM | POA: Diagnosis not present

## 2022-05-31 DIAGNOSIS — N281 Cyst of kidney, acquired: Secondary | ICD-10-CM | POA: Diagnosis not present

## 2022-06-25 ENCOUNTER — Ambulatory Visit: Payer: Medicare Other

## 2022-06-27 ENCOUNTER — Ambulatory Visit (INDEPENDENT_AMBULATORY_CARE_PROVIDER_SITE_OTHER): Payer: Medicare Other

## 2022-06-27 VITALS — Ht 65.5 in | Wt 160.0 lb

## 2022-06-27 DIAGNOSIS — Z0000 Encounter for general adult medical examination without abnormal findings: Secondary | ICD-10-CM

## 2022-06-27 NOTE — Progress Notes (Signed)
Subjective:   Nancy [name] is a 87 y.o. female who presents for Medicare Annual (Subsequent) preventive examination.  Review of Systems    Virtual Visit via Telephone Note  I connected with  Nancy [name] on 06/27/22 at 12:30 PM EST by telephone and verified that I am speaking with the correct person using two identifiers.  Location: Patient: Home Provider: Office Persons participating in the virtual visit: patient/Nurse Health Advisor   I discussed the limitations, risks, security and privacy concerns of performing an evaluation and management service by telephone and the availability of in person appointments. The patient expressed understanding and agreed to proceed.  Interactive audio and video telecommunications were attempted between this nurse and patient, however failed, due to patient having technical difficulties OR patient did not have access to video capability.  We continued and completed visit with audio only.  Some vital signs may be absent or patient reported.   Criselda Peaches, LPN  Cardiac Risk Factors include: advanced age (>65mn, >>60women)     Objective:    Today's Vitals   06/27/22 1337  Weight: 160 lb (72.6 kg)  Height: 5' 5.5" (1.664 m)   Body mass index is 26.22 kg/m.     06/27/2022    1:43 PM 06/06/2021    8:30 AM 05/17/2020    2:38 PM 03/30/2019   11:23 AM 03/18/2018    6:42 PM 03/13/2018   10:41 AM 02/14/2017    8:21 AM  Advanced Directives  Does Patient Have a Medical Advance Directive? Yes Yes Yes Yes Yes Yes Yes  Type of AParamed[place]of [place] will [place] will Living will;Out of facility DNR (pink MOST or yellow form) [place] will [place]Living will [place] will   Does patient want to make changes to medical advance directive?    No - Patient declined No - Patient declined No - Patient declined    Copy of [place] Chart? No - copy requested No - copy requested   No - copy requested No - copy requested     Current Medications (verified) Outpatient Encounter Medications as of 06/27/2022  Medication Sig   CALCIUM PO Take 600 mg by mouth daily.   cholecalciferol (VITAMIN D3) 25 MCG (1000 UT) tablet Take 2,000 Units by mouth daily.    eszopiclone 3 MG TABS Take 1 tablet (3 mg total) by mouth at bedtime as needed. Take immediately before bedtime   levothyroxine (SYNTHROID) 50 MCG tablet Take 1 tablet (50 mcg total) by mouth daily.   loratadine (CLARITIN) 10 MG tablet Take 10 mg by mouth as needed.   Multiple Vitamins-Minerals (ZINC PO) Take 50 mg by mouth.   Omega-3 Fatty Acids (FISH OIL PO) Take 1,200 mg by mouth.   PROLIA 60 MG/ML SOSY injection Inject into the skin once.   Facility-Administered Encounter Medications as of 06/27/2022  Medication   0.9 %  sodium chloride infusion    Allergies (verified) Norco [hydrocodone-acetaminophen], Penicillins, and Sulfamethoxazole   History: Past Medical History:  Diagnosis Date   Allergy    Avascular necrosis ([place]    RIGHT SHOULDER   Blood transfusion without reported diagnosis    With shoulder surgery, 2019   Chronic cough    saw several specialists per her report, NONE NOW   Closed 4-part fracture of proximal end of right humerus, with malunion, subsequent encounter with AVN 03/18/2018   Hypothyroidism  Osteoporosis    Sees Dr. Layne Benton at [place], on prolia   PONV (postoperative nausea and vomiting)    severe post op nausea and vomiting   Postoperative anemia due to acute blood loss 03/19/2018   Past Surgical History:  Procedure Laterality Date   ABDOMINAL HYSTERECTOMY  1975   PARTIAL; had ovarian cysts. no cancer concerns.   APPENDECTOMY     CATARACT EXTRACTION Right    08/2020   LAPAROSCOPY     removal of ovaries   OOPHORECTOMY Bilateral 2007   removal growth thyroid  1998   REVERSE  SHOULDER ARTHROPLASTY Right 03/18/2018   Procedure: REVERSE RIGHT SHOULDER ARTHROPLASTY;  Surgeon: Marchia Bond, MD;  Location: [facility];  Service: Orthopedics;  Laterality: Right;   TONSILLECTOMY     TRIGGER FINGER RELEASE Left 04/06/2019   Procedure: DUPUYTREN 'S AND CONTRACTURE RELEASE LEFT FOURTH FINGER;  Surgeon: Cristine Polio, MD;  Location: [place];  Service: Plastics;  Laterality: Left;   Family History  Problem Relation Age of Onset   Dementia Mother    Colon cancer Mother 4   Parkinsonism Father    Hypertension Father    Colon cancer Sister 65   Hypertension Sister    Stroke Sister    Osteoporosis Other    Stomach cancer Neg Hx    Colon polyps Neg Hx    Esophageal cancer Neg Hx    Rectal cancer Neg Hx    Social History   Socioeconomic History   Marital status: Widowed    Spouse name: Not on file   Number of children: Not on file   Years of education: Not on file   Highest education level: Not on file  Occupational History   Occupation: retired  Tobacco Use   Smoking status: Never   Smokeless tobacco: Never  Vaping Use   Vaping Use: Never used  Substance and Sexual Activity   Alcohol use: Never   Drug use: Never   Sexual activity: Not Currently  Other Topics Concern   Not on file  Social History Narrative   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 Situation: lives alone, likes to travel      Spiritual Beliefs: Methodist      Lifestyle: regular walking, tries to eat healthy      Social Determinants of Health   Financial Resource Strain: Low Risk  (06/27/2022)   Overall Financial Resource Strain (CARDIA)    Difficulty of Paying Living Expenses: Not hard at all  Food Insecurity: No Food Insecurity (06/27/2022)   Hunger Vital Sign    Worried About Running Out of Food in the Last Year: Never true    [place] in the Last Year: Never true  Transportation Needs: No Transportation Needs (06/27/2022)   PRAPARE - Civil engineer, contracting (Medical): No    Lack of Transportation (Non-Medical): No  Physical Activity: Inactive (06/27/2022)   Exercise Vital Sign    Days of Exercise per Week: 0 days    Minutes of Exercise per Session: 0 min  Stress: No Stress Concern Present (06/27/2022)   [place]    Feeling of Stress : Not at all  Social Connections: Moderately Integrated (06/27/2022)   Social Connection and Isolation Panel [NHANES]    Frequency of Communication with Friends and Family: More than three times a week    Frequency of Social Gatherings with Friends and Family:  More than three times a week    Attends Religious Services: More than 4 times per year    Active Member of Clubs or Organizations: Yes    Attends Archivist Meetings: More than 4 times per year    Marital Status: Widowed    Tobacco Counseling Counseling given: Not Answered   Clinical Intake:  Pre-visit preparation completed: No  Pain : No/denies pain     BMI - recorded: 26.22 Nutritional Status: BMI 25 -29 Overweight Nutritional Risks: None Diabetes: No  How often do you need to have someone help you when you read instructions, pamphlets, or other written materials from your doctor or pharmacy?: 1 - Never  Diabetic?  No  Interpreter Needed?: No  Information entered by :: Rolene Arbour LPN   Activities of Daily Living    06/27/2022    1:42 PM  In your present state of health, do you have any difficulty performing the following activities:  Hearing? 0  Vision? 0  Difficulty concentrating or making decisions? 0  Walking or climbing stairs? 0  Dressing or bathing? 0  Doing errands, shopping? 0  Preparing Food and eating ? N  Using the Toilet? N  In the past six months, have you accidently leaked urine? N  Do you have problems with loss of bowel control? N  Managing your Medications? N  Managing your Finances? N  Housekeeping or managing  your Housekeeping? N    Patient Care Team: Caren Macadam, MD (Inactive) as PCP - General (Family Medicine) Bobbye Charleston, MD as Consulting Physician (Obstetrics and Gynecology) Ninetta Lights, MD (Inactive) as Consulting Physician (Orthopedic Surgery) Verner Chol, MD as Consulting Physician (Sports Medicine) Verner Chol, MD as Consulting Physician (Sports Medicine) Marchia Bond, MD as Consulting Physician (Orthopedic Surgery)  Indicate any recent Medical Services you may have received from other than [facility] providers in the past year (date may be approximate).     Assessment:   This is a routine wellness examination for Nancy [name].  Hearing/Vision screen Hearing Screening - Comments:: Denies hearing difficulties   Vision Screening - Comments:: Wears reading glasses - up to date with routine eye exams with Dr Celene Squibb   Dietary issues and exercise activities discussed: Current Exercise Habits: The patient does not participate in regular exercise at present, Exercise limited by: None identified   Goals Addressed               This Visit's Progress     Patient Stated (pt-stated)        None at this time       Depression Screen    06/27/2022    1:42 PM 06/06/2021    8:33 AM 05/17/2020    2:37 PM 03/14/2020    9:51 AM 01/30/2018    9:23 AM 02/14/2017    8:25 AM 10/05/2015   10:59 AM  PHQ 2/9 Scores  PHQ - 2 Score 0 0 0 0 0 0 0    Fall Risk    06/27/2022    1:43 PM 06/06/2021    8:33 AM 03/15/2021    9:15 AM 05/17/2020    2:40 PM 01/19/2019    1:28 PM  [place] in the past year? 0 0 0 0 1  Comment     Emmi Telephone Survey: data to providers prior to load  Number falls in past yr: 0  0 0 1  Comment     Emmi Telephone Survey Actual Response =  11  Injury with Fall? 0   0 1  Risk for fall due to : No Fall Risks Medication side effect  Impaired vision   Follow up Falls prevention discussed Falls evaluation completed;Education  provided;Falls prevention discussed  Falls prevention discussed     FALL RISK PREVENTION PERTAINING TO THE HOME:  Any stairs in or around the home? Yes  If so, are there any without handrails? No  Home free of loose throw rugs in walkways, pet beds, electrical cords, etc? Yes  Adequate lighting in your home to reduce risk of falls? Yes   ASSISTIVE DEVICES UTILIZED TO PREVENT FALLS:  Life alert? No  Use of a cane, walker or w/c? No  Grab bars in the bathroom? Yes  Shower chair or bench in shower? Yes  Elevated toilet seat or a handicapped toilet? Yes   TIMED UP AND GO:  Was the test performed? No . Audio Visit   Cognitive Function:        06/27/2022    1:43 PM 06/06/2021    8:34 AM 05/17/2020    2:42 PM  6CIT Screen  What Year? 0 points 0 points 0 points  What month? 0 points 0 points 0 points  What time? 0 points 0 points   Count back from 20 0 points 0 points 0 points  Months in reverse 0 points 0 points 0 points  Repeat phrase 0 points 0 points 0 points  Total Score 0 points 0 points     Immunizations Immunization History  Administered Date(s) Administered   Fluad Quad(high Dose 65+) 03/14/2020, 03/15/2021   Influenza Whole 04/17/2007, 04/06/2010   Influenza, High Dose Seasonal PF 05/27/2015, 03/15/2016, 03/03/2018   Influenza,inj,Quad PF,6+ Mos 02/23/2013   Influenza-Unspecified 03/17/2014, 03/18/2017   PFIZER(Purple Top)SARS-COV-2 Vaccination 09/07/2019, 09/30/2019, 05/20/2020   Pfizer Covid-19 Vaccine Bivalent Booster 49yr & up 04/17/2021   Pneumococcal Conjugate-13 05/27/2015   Pneumococcal Polysaccharide-23 04/14/2008, 11/01/2017   Respiratory Syncytial Virus Vaccine,Recomb Aduvanted(Arexvy) 05/30/2022   Td 06/18/2005, 03/18/2009   Tdap 01/17/2020   Zoster Recombinat (Shingrix) 09/16/2017, 07/09/2018   Zoster, Live 04/27/2008    TDAP status: Up to date  Flu Vaccine status: Up to date  Pneumococcal vaccine status: Up to date  Covid-19 vaccine  status: Completed vaccines  Qualifies for Shingles Vaccine? Yes   Zostavax completed Yes   Shingrix Completed?: Yes  Screening Tests Health Maintenance  Topic Date Due   COVID-19 Vaccine (5 - 2023-24 season) 07/13/2022 (Originally 02/16/2022)   INFLUENZA VACCINE  09/16/2022 (Originally 01/16/2022)   Medicare Annual Wellness (AWV)  06/28/2023   DTaP/Tdap/Td (4 - Td or Tdap) 01/16/2030   Pneumonia Vaccine 85 Years old  Completed   DEXA SCAN  Completed   Zoster Vaccines- Shingrix  Completed   HPV VACCINES  Aged Out    Health Maintenance  There are no preventive care reminders to display for this patient.   Colorectal cancer screening: No longer required.   Mammogram status: No longer required due to Age.  Bone Density status: Completed 07/12/20. Results reflect: Bone density results: OSTEOPOROSIS. Repeat every   years.  Lung Cancer Screening: (Low Dose CT Chest recommended if Age 85-80years, 30 pack-year currently smoking OR have quit w/in 15years.) does not qualify.     Additional Screening:  Hepatitis C Screening: does not qualify; Completed    Vision Screening: Recommended annual ophthalmology exams for early detection of glaucoma and other disorders of the eye. Is the patient up to date with their annual eye  exam?  Yes  Who is the provider or what is the name of the office in which the patient attends annual eye exams?Dr  Celene Squibb If pt is not established with a provider, would they like to be referred to a provider to establish care? No .   Dental Screening: Recommended annual dental exams for proper oral hygiene  Community Resource Referral / Chronic Care Management: CRR required this visit?  No   CCM required this visit?  No      Plan:     I have personally reviewed and noted the following in the patient's chart:   Medical and social history Use of alcohol, tobacco or illicit drugs  Current medications and supplements including opioid prescriptions. Patient  is not currently taking opioid prescriptions. Functional ability and status Nutritional status Physical activity Advanced directives List of other physicians Hospitalizations, surgeries, and ER visits in previous 12 months Vitals Screenings to include cognitive, depression, and falls Referrals and appointments  In addition, I have reviewed and discussed with patient certain preventive protocols, quality metrics, and best practice recommendations. A written personalized care plan for preventive services as well as general preventive health recommendations were provided to patient.     Criselda Peaches, LPN   7/57/9728   Nurse Notes: None

## 2022-06-27 NOTE — Patient Instructions (Addendum)
Nancy [name] , Thank you for taking time to come for your Medicare Wellness Visit. I appreciate your ongoing commitment to your health goals. Please review the following plan we discussed and let me know if I can assist you in the future.   These are the goals we discussed:  Goals       patient      Will continue to travel several times a year       Patient Stated (pt-stated)      None at this time      Patient Stated      06/06/2021, maintain        This is a list of the screening recommended for you and due dates:  Health Maintenance  Topic Date Due   COVID-19 Vaccine (5 - 2023-24 season) 07/13/2022*   Flu Shot  09/16/2022*   Medicare Annual Wellness Visit  06/28/2023   DTaP/Tdap/Td vaccine (4 - Td or Tdap) 01/16/2030   Pneumonia Vaccine  Completed   DEXA scan (bone density measurement)  Completed   Zoster (Shingles) Vaccine  Completed   HPV Vaccine  Aged Out  *Topic was postponed. The date shown is not the original due date.    Advanced directives: Please bring a copy of your health care power of attorney and living will to the office to be added to your chart at your convenience.   Conditions/risks identified: none  Next appointment: Follow up in one year for your annual wellness visit     Preventive Care 65 Years and Older, Female Preventive care refers to lifestyle choices and visits with your health care provider that can promote health and wellness. What does preventive care include? A yearly physical exam. This is also called an annual well check. Dental exams once or twice a year. Routine eye exams. Ask your health care provider how often you should have your eyes checked. Personal lifestyle choices, including: Daily care of your teeth and gums. Regular physical activity. Eating a healthy diet. Avoiding tobacco and drug use. Limiting alcohol use. Practicing safe sex. Taking low-dose aspirin every day. Taking vitamin and mineral supplements as recommended  by your health care provider. What happens during an annual well check? The services and screenings done by your health care provider during your annual well check will depend on your age, overall health, lifestyle risk factors, and family history of disease. Counseling  Your health care provider may ask you questions about your: Alcohol use. Tobacco use. Drug use. Emotional well-being. Home and relationship well-being. Sexual activity. Eating habits. History of falls. Memory and ability to understand (cognition). Work and work Statistician. Reproductive health. Screening  You may have the following tests or measurements: Height, weight, and BMI. Blood pressure. Lipid and cholesterol levels. These may be checked every 5 years, or more frequently if you are over 65 years old. Skin check. Lung cancer screening. You may have this screening every year starting at age 98 if you have a 30-pack-year history of smoking and currently smoke or have quit within the past 15 years. Fecal occult blood test (FOBT) of the stool. You may have this test every year starting at age 106. Flexible sigmoidoscopy or colonoscopy. You may have a sigmoidoscopy every 5 years or a colonoscopy every 10 years starting at age 16. Hepatitis C blood test. Hepatitis B blood test. Sexually transmitted disease (STD) testing. Diabetes screening. This is done by checking your blood sugar (glucose) after you have not eaten for a while (fasting). You  may have this done every 1-3 years. Bone density scan. This is done to screen for osteoporosis. You may have this done starting at age 56. Mammogram. This may be done every 1-2 years. Talk to your health care provider about how often you should have regular mammograms. Talk with your health care provider about your test results, treatment options, and if necessary, the need for more tests. Vaccines  Your health care provider may recommend certain vaccines, such as: Influenza  vaccine. This is recommended every year. Tetanus, diphtheria, and acellular pertussis (Tdap, Td) vaccine. You may need a Td booster every 10 years. Zoster vaccine. You may need this after age 35. Pneumococcal 13-valent conjugate (PCV13) vaccine. One dose is recommended after age 57. Pneumococcal polysaccharide (PPSV23) vaccine. One dose is recommended after age 18. Talk to your health care provider about which screenings and vaccines you need and how often you need them. This information is not intended to replace advice given to you by your health care provider. Make sure you discuss any questions you have with your health care provider. Document Released: 07/01/2015 Document Revised: 02/22/2016 Document Reviewed: 04/05/2015 Elsevier Interactive Patient Education  2017 [employer] Prevention in the Home Falls can cause injuries. They can happen to people of all ages. There are many things you can do to make your home safe and to help prevent falls. What can I do on the outside of my home? Regularly fix the edges of walkways and driveways and fix any cracks. Remove anything that might make you trip as you walk through a door, such as a raised step or threshold. Trim any bushes or trees on the path to your home. Use bright outdoor lighting. Clear any walking paths of anything that might make someone trip, such as rocks or tools. Regularly check to see if handrails are loose or broken. Make sure that both sides of any steps have handrails. Any raised decks and porches should have guardrails on the edges. Have any leaves, snow, or ice cleared regularly. Use sand or salt on walking paths during winter. Clean up any spills in your garage right away. This includes oil or grease spills. What can I do in the bathroom? Use night lights. Install grab bars by the toilet and in the tub and shower. Do not use towel bars as grab bars. Use non-skid mats or decals in the tub or shower. If you  need to sit down in the shower, use a plastic, non-slip stool. Keep the floor dry. Clean up any water that spills on the floor as soon as it happens. Remove soap buildup in the tub or shower regularly. Attach bath mats securely with double-sided non-slip rug tape. Do not have throw rugs and other things on the floor that can make you trip. What can I do in the bedroom? Use night lights. Make sure that you have a light by your bed that is easy to reach. Do not use any sheets or blankets that are too big for your bed. They should not hang down onto the floor. Have a firm chair that has side arms. You can use this for support while you get dressed. Do not have throw rugs and other things on the floor that can make you trip. What can I do in the kitchen? Clean up any spills right away. Avoid walking on wet floors. Keep items that you use a lot in easy-to-reach places. If you need to reach something above you, use a strong  step stool that has a grab bar. Keep electrical cords out of the way. Do not use floor polish or wax that makes floors slippery. If you must use wax, use non-skid floor wax. Do not have throw rugs and other things on the floor that can make you trip. What can I do with my stairs? Do not leave any items on the stairs. Make sure that there are handrails on both sides of the stairs and use them. Fix handrails that are broken or loose. Make sure that handrails are as long as the stairways. Check any carpeting to make sure that it is firmly attached to the stairs. Fix any carpet that is loose or worn. Avoid having throw rugs at the top or bottom of the stairs. If you do have throw rugs, attach them to the floor with carpet tape. Make sure that you have a light switch at the top of the stairs and the bottom of the stairs. If you do not have them, ask someone to add them for you. What else can I do to help prevent falls? Wear shoes that: Do not have high heels. Have rubber  bottoms. Are comfortable and fit you well. Are closed at the toe. Do not wear sandals. If you use a stepladder: Make sure that it is fully opened. Do not climb a closed stepladder. Make sure that both sides of the stepladder are locked into place. Ask someone to hold it for you, if possible. Clearly mark and make sure that you can see: Any grab bars or handrails. First and last steps. Where the edge of each step is. Use tools that help you move around (mobility aids) if they are needed. These include: Canes. Walkers. Scooters. Crutches. Turn on the lights when you go into a dark area. Replace any light bulbs as soon as they burn out. Set up your furniture so you have a clear path. Avoid moving your furniture around. If any of your floors are uneven, fix them. If there are any pets around you, be aware of where they are. Review your medicines with your doctor. Some medicines can make you feel dizzy. This can increase your chance of falling. Ask your doctor what other things that you can do to help prevent falls. This information is not intended to replace advice given to you by your health care provider. Make sure you discuss any questions you have with your health care provider. Document Released: 03/31/2009 Document Revised: 11/10/2015 Document Reviewed: 07/09/2014 Elsevier Interactive Patient Education  2017 [employer].

## 2022-07-05 DIAGNOSIS — E559 Vitamin D deficiency, unspecified: Secondary | ICD-10-CM | POA: Diagnosis not present

## 2022-07-05 DIAGNOSIS — M810 Age-related osteoporosis without current pathological fracture: Secondary | ICD-10-CM | POA: Diagnosis not present

## 2022-07-05 DIAGNOSIS — R5383 Other fatigue: Secondary | ICD-10-CM | POA: Diagnosis not present

## 2022-07-17 DIAGNOSIS — M810 Age-related osteoporosis without current pathological fracture: Secondary | ICD-10-CM | POA: Diagnosis not present

## 2022-07-17 DIAGNOSIS — M1711 Unilateral primary osteoarthritis, right knee: Secondary | ICD-10-CM | POA: Diagnosis not present

## 2022-07-20 DIAGNOSIS — M85852 Other specified disorders of bone density and structure, left thigh: Secondary | ICD-10-CM | POA: Diagnosis not present

## 2022-07-20 DIAGNOSIS — Z780 Asymptomatic menopausal state: Secondary | ICD-10-CM | POA: Diagnosis not present

## 2022-07-20 DIAGNOSIS — M85851 Other specified disorders of bone density and structure, right thigh: Secondary | ICD-10-CM | POA: Diagnosis not present

## 2022-07-30 DIAGNOSIS — X32XXXA Exposure to sunlight, initial encounter: Secondary | ICD-10-CM | POA: Diagnosis not present

## 2022-07-30 DIAGNOSIS — L820 Inflamed seborrheic keratosis: Secondary | ICD-10-CM | POA: Diagnosis not present

## 2022-07-30 DIAGNOSIS — L570 Actinic keratosis: Secondary | ICD-10-CM | POA: Diagnosis not present

## 2022-07-30 DIAGNOSIS — B078 Other viral warts: Secondary | ICD-10-CM | POA: Diagnosis not present

## 2022-07-31 DIAGNOSIS — M1711 Unilateral primary osteoarthritis, right knee: Secondary | ICD-10-CM | POA: Diagnosis not present

## 2022-08-15 ENCOUNTER — Other Ambulatory Visit: Payer: Self-pay | Admitting: Nephrology

## 2022-08-15 DIAGNOSIS — N183 Chronic kidney disease, stage 3 unspecified: Secondary | ICD-10-CM

## 2022-08-20 ENCOUNTER — Ambulatory Visit
Admission: RE | Admit: 2022-08-20 | Discharge: 2022-08-20 | Disposition: A | Discharge status: Home / Self Care | Payer: Medicare Other | Source: Ambulatory Visit | Attending: Nephrology | Admitting: Nephrology

## 2022-08-20 DIAGNOSIS — N183 Chronic kidney disease, stage 3 unspecified: Secondary | ICD-10-CM | POA: Diagnosis not present

## 2022-08-20 MED ORDER — GADOPICLENOL 0.5 MMOL/ML IV SOLN
7.0000 mL | Freq: Once | INTRAVENOUS | Status: AC | PRN
  Administered 2022-08-20: 7 mL via INTRAVENOUS

## 2022-09-03 ENCOUNTER — Other Ambulatory Visit: Payer: Self-pay | Admitting: Family Medicine

## 2022-09-11 DIAGNOSIS — Z961 Presence of intraocular lens: Secondary | ICD-10-CM | POA: Diagnosis not present

## 2022-09-19 ENCOUNTER — Encounter: Payer: Self-pay | Admitting: Internal Medicine

## 2022-09-19 ENCOUNTER — Ambulatory Visit (INDEPENDENT_AMBULATORY_CARE_PROVIDER_SITE_OTHER): Payer: Medicare Other | Admitting: Internal Medicine

## 2022-09-19 VITALS — BP 110/74 | HR 76 | Temp 98.3°F | Ht 65.5 in | Wt 158.8 lb

## 2022-09-19 DIAGNOSIS — E782 Mixed hyperlipidemia: Secondary | ICD-10-CM | POA: Diagnosis not present

## 2022-09-19 DIAGNOSIS — M810 Age-related osteoporosis without current pathological fracture: Secondary | ICD-10-CM

## 2022-09-19 DIAGNOSIS — E039 Hypothyroidism, unspecified: Secondary | ICD-10-CM

## 2022-09-19 NOTE — Progress Notes (Signed)
Established Patient Office Visit     CC/Reason for Visit: Establish care, discuss chronic conditions  HPI: Nancy [name] is a 87 y.o. female who is coming in today for the above mentioned reasons. Past Medical History is significant for: Osteoporosis on Prolia, hypothyroidism on levothyroxine and hyperlipidemia not on medication.  She is very active, lives independently, takes care of her own house.  She has a trip to [place] planned for the fall.  She does not smoke, she does not drink.  Her past surgical history significant for right shoulder replacement, family history significant for 2 sisters with hypertension and 1 with a CVA.  All immunizations are up-to-date with the exception of RSV.   Past Medical/Surgical History: Past Medical History:  Diagnosis Date   Allergy    Avascular necrosis    RIGHT SHOULDER   Blood transfusion without reported diagnosis    With shoulder surgery, 2019   Chronic cough    saw several specialists per her report, NONE NOW   Closed 4-part fracture of proximal end of right humerus, with malunion, subsequent encounter with AVN 03/18/2018   Hypothyroidism    Osteoporosis    Sees Dr. Layne Benton at [facility], on prolia   PONV (postoperative nausea and vomiting)    severe post op nausea and vomiting   Postoperative anemia due to acute blood loss 03/19/2018    Past Surgical History:  Procedure Laterality Date   ABDOMINAL HYSTERECTOMY  1975   PARTIAL; had ovarian cysts. no cancer concerns.   APPENDECTOMY     CATARACT EXTRACTION Right    08/2020   LAPAROSCOPY     removal of ovaries   OOPHORECTOMY Bilateral 2007   removal growth thyroid  1998   REVERSE SHOULDER ARTHROPLASTY Right 03/18/2018   Procedure: REVERSE RIGHT SHOULDER ARTHROPLASTY;  Surgeon: Marchia Bond, MD;  Location: [facility];  Service: Orthopedics;  Laterality: Right;   TONSILLECTOMY     TRIGGER FINGER RELEASE Left 04/06/2019   Procedure: DUPUYTREN 'S AND CONTRACTURE RELEASE  LEFT FOURTH FINGER;  Surgeon: Cristine Polio, MD;  Location: [facility];  Service: Plastics;  Laterality: Left;    Social History:  reports that she has never smoked. She has never used smokeless tobacco. She reports that she does not drink alcohol and does not use drugs.  Allergies: Allergies  Allergen Reactions   Norco [Hydrocodone-Acetaminophen] Hives   Penicillins Hives    Has patient had a PCN reaction causing immediate rash, facial/tongue/throat swelling, SOB or lightheadedness with hypotension: yes Has patient had a PCN reaction causing severe rash involving mucus membranes or skin necrosis: no Has patient had a PCN reaction that required hospitalization: no Has patient had a PCN reaction occurring within the last 10 years: yes If all of the above answers are "NO", then may proceed with Cephalosporin use.    Sulfamethoxazole Hives    Family History:  Family History  Problem Relation Age of Onset   Dementia Mother    Colon cancer Mother 28   Parkinsonism Father    Hypertension Father    Colon cancer Sister 32   Hypertension Sister    Stroke Sister    Osteoporosis Other    Stomach cancer Neg Hx    Colon polyps Neg Hx    Esophageal cancer Neg Hx    Rectal cancer Neg Hx      Current Outpatient Medications:    CALCIUM PO, Take 600 mg by mouth daily., Disp: , Rfl:  cholecalciferol (VITAMIN D3) 25 MCG (1000 UT) tablet, Take 2,000 Units by mouth daily. , Disp: , Rfl:    eszopiclone 3 MG TABS, Take 1 tablet (3 mg total) by mouth at bedtime as needed. Take immediately before bedtime, Disp: 90 tablet, Rfl: 3   levothyroxine (SYNTHROID) 50 MCG tablet, TAKE 1 TABLET BY MOUTH DAILY, Disp: 90 tablet, Rfl: 0   loratadine (CLARITIN) 10 MG tablet, Take 10 mg by mouth as needed., Disp: , Rfl:    Multiple Vitamins-Minerals (ZINC PO), Take 50 mg by mouth., Disp: , Rfl:    Omega-3 Fatty Acids (FISH OIL PO), Take 1,200 mg by mouth., Disp: , Rfl:    PROLIA 60 MG/ML  SOSY injection, Inject into the skin once., Disp: , Rfl:    zinc gluconate 50 MG tablet, Take 50 mg by mouth daily., Disp: , Rfl:   Current Facility-Administered Medications:    0.9 %  sodium chloride infusion, 500 mL, Intravenous, Once, Irene Shipper, MD  Review of Systems:  Negative unless indicated in HPI.   Physical Exam: Vitals:   09/19/22 1306  BP: 110/74  Pulse: 76  Temp: 98.3 F (36.8 C)  TempSrc: Oral  SpO2: 99%  Weight: 158 lb 12.8 oz (72 kg)  Height: 5' 5.5" (1.664 m)    Body mass index is 26.02 kg/m.   Physical Exam Vitals reviewed.  Constitutional:      Appearance: Normal appearance.  HENT:     Head: Normocephalic and atraumatic.  Eyes:     Conjunctiva/sclera: Conjunctivae normal.     Pupils: Pupils are equal, round, and reactive to light.  Cardiovascular:     Rate and Rhythm: Normal rate and regular rhythm.  Pulmonary:     Effort: Pulmonary effort is normal.     Breath sounds: Normal breath sounds.  Skin:    General: Skin is warm and dry.  Neurological:     General: No focal deficit present.     Mental Status: She is alert and oriented to person, place, and time.  Psychiatric:        Mood and Affect: Mood normal.        Behavior: Behavior normal.        Thought Content: Thought content normal.        Judgment: Judgment normal.      Impression and Plan:  Acquired hypothyroidism  Mixed hyperlipidemia  Osteoporosis, unspecified osteoporosis type, unspecified pathological fracture presence  -Continue Prolia every 6 months. -She is not managed with statins for hyperlipidemia. -At last check her TSH was within range at 2.040 on levothyroxine 50 mcg daily. -She will schedule labs in 6 months.  Time spent:30 minutes reviewing chart, interviewing and examining patient and formulating plan of care.     Lelon Frohlich, MD [facility]

## 2022-09-24 ENCOUNTER — Encounter: Payer: Self-pay | Admitting: Internal Medicine

## 2022-10-23 DIAGNOSIS — M25561 Pain in right knee: Secondary | ICD-10-CM | POA: Diagnosis not present

## 2022-10-23 DIAGNOSIS — S83281A Other tear of lateral meniscus, current injury, right knee, initial encounter: Secondary | ICD-10-CM | POA: Diagnosis not present

## 2022-11-01 DIAGNOSIS — M7121 Synovial cyst of popliteal space [Baker], right knee: Secondary | ICD-10-CM | POA: Diagnosis not present

## 2022-12-26 ENCOUNTER — Other Ambulatory Visit: Payer: Self-pay | Admitting: Family Medicine

## 2023-01-01 DIAGNOSIS — R5383 Other fatigue: Secondary | ICD-10-CM | POA: Diagnosis not present

## 2023-01-01 DIAGNOSIS — M810 Age-related osteoporosis without current pathological fracture: Secondary | ICD-10-CM | POA: Diagnosis not present

## 2023-01-01 DIAGNOSIS — E559 Vitamin D deficiency, unspecified: Secondary | ICD-10-CM | POA: Diagnosis not present

## 2023-01-08 DIAGNOSIS — M1711 Unilateral primary osteoarthritis, right knee: Secondary | ICD-10-CM | POA: Diagnosis not present

## 2023-01-14 ENCOUNTER — Other Ambulatory Visit: Payer: Self-pay

## 2023-01-16 DIAGNOSIS — M1711 Unilateral primary osteoarthritis, right knee: Secondary | ICD-10-CM | POA: Diagnosis not present

## 2023-01-21 ENCOUNTER — Ambulatory Visit: Payer: Medicare Other

## 2023-01-21 ENCOUNTER — Encounter: Payer: Self-pay | Admitting: Sports Medicine

## 2023-01-21 VITALS — BP 117/74 | HR 86 | Temp 98.3°F | Resp 19 | Ht 65.0 in | Wt 159.0 lb

## 2023-01-21 DIAGNOSIS — M810 Age-related osteoporosis without current pathological fracture: Secondary | ICD-10-CM | POA: Diagnosis not present

## 2023-01-21 MED ORDER — DENOSUMAB 60 MG/ML ~~LOC~~ SOSY
60.0000 mg | PREFILLED_SYRINGE | Freq: Once | SUBCUTANEOUS | Status: AC
  Administered 2023-01-21: 60 mg via SUBCUTANEOUS
  Filled 2023-01-21: qty 1

## 2023-01-21 NOTE — Patient Instructions (Signed)
Denosumab Injection (Osteoporosis) What is this medication? DENOSUMAB (den oh SUE mab) prevents and treats osteoporosis. It works by making your bones stronger and less likely to break (fracture). It is a monoclonal antibody. This medicine may be used for other purposes; ask your health care provider or pharmacist if you have questions. COMMON BRAND NAME(S): Prolia What should I tell my care team before I take this medication? They need to know if you have any of these conditions: Dental or gum disease Had thyroid or parathyroid (glands located in neck) surgery Having dental surgery or a tooth pulled Kidney disease Low levels of calcium in the blood On dialysis Poor nutrition Thyroid disease Trouble absorbing nutrients from your food An unusual or allergic reaction to denosumab, other medications, foods, dyes, or preservatives Pregnant or trying to get pregnant Breastfeeding How should I use this medication? This medication is injected under the skin. It is given by your care team in a hospital or clinic setting. A special MedGuide will be given to you before each treatment. Be sure to read this information carefully each time. Talk to your care team about the use of this medication in children. Special care may be needed. Overdosage: If you think you have taken too much of this medicine contact a poison control center or emergency room at once. NOTE: This medicine is only for you. Do not share this medicine with others. What if I miss a dose? Keep appointments for follow-up doses. It is important not to miss your dose. Call your care team if you are unable to keep an appointment. What may interact with this medication? Do not take this medication with any of the following: Other medications that contain denosumab This medication may also interact with the following: Medications that lower your chance of fighting infection Steroid medications, such as prednisone or cortisone This  list may not describe all possible interactions. Give your health care provider a list of all the medicines, herbs, non-prescription drugs, or dietary supplements you use. Also tell them if you smoke, drink alcohol, or use illegal drugs. Some items may interact with your medicine. What should I watch for while using this medication? Your condition will be monitored carefully while you are receiving this medication. You may need blood work done while taking this medication. This medication may increase your risk of getting an infection. Call your care team for advice if you get a fever, chills, sore throat, or other symptoms of a cold or flu. Do not treat yourself. Try to avoid being around people who are sick. Tell your dentist and dental surgeon that you are taking this medication. You should not have major dental surgery while on this medication. See your dentist to have a dental exam and fix any dental problems before starting this medication. Take good care of your teeth while on this medication. Make sure you see your dentist for regular follow-up appointments. This medication may cause low levels of calcium in your body. The risk of severe side effects is increased in people with kidney disease. Your care team may prescribe calcium and vitamin D to help prevent low calcium levels while you take this medication. It is important to take calcium and vitamin D as directed by your care team. Talk to your care team if you may be pregnant. Serious birth defects may occur if you take this medication during pregnancy and for 5 months after the last dose. You will need a negative pregnancy test before starting this medication. Contraception   is recommended while taking this medication and for 5 months after the last dose. Your care team can help you find the option that works for you. Talk to your care team before breastfeeding. Changes to your treatment plan may be needed. What side effects may I notice from  receiving this medication? Side effects that you should report to your care team as soon as possible: Allergic reactions--skin rash, itching, hives, swelling of the face, lips, tongue, or throat Infection--fever, chills, cough, sore throat, wounds that don't heal, pain or trouble when passing urine, general feeling of discomfort or being unwell Low calcium level--muscle pain or cramps, confusion, tingling, or numbness in the hands or feet Osteonecrosis of the jaw--pain, swelling, or redness in the mouth, numbness of the jaw, poor healing after dental work, unusual discharge from the mouth, visible bones in the mouth Severe bone, joint, or muscle pain Skin infection--skin redness, swelling, warmth, or pain Side effects that usually do not require medical attention (report these to your care team if they continue or are bothersome): Back pain Headache Joint pain Muscle pain Pain in the hands, arms, legs, or feet Runny or stuffy nose Sore throat This list may not describe all possible side effects. Call your doctor for medical advice about side effects. You may report side effects to FDA at 1-800-FDA-1088. Where should I keep my medication? This medication is given in a hospital or clinic. It will not be stored at home. NOTE: This sheet is a summary. It may not cover all possible information. If you have questions about this medicine, talk to your doctor, pharmacist, or health care provider.  2024 Elsevier/Gold Standard (2022-07-10 00:00:00)  

## 2023-01-21 NOTE — Progress Notes (Signed)
Diagnosis: Osteoporosis  Provider:  Chilton Greathouse MD  Procedure: Injection  Prolia (Denosumab), Dose: 60 mg, Site: subcutaneous, Number of injections: 1  Post Care: Patient declined observation.she received in past.  Discharge: Condition: Good, Destination: Home . AVS Provided  Performed by:  Garnette Czech, RN

## 2023-01-23 DIAGNOSIS — M1711 Unilateral primary osteoarthritis, right knee: Secondary | ICD-10-CM | POA: Diagnosis not present

## 2023-01-30 DIAGNOSIS — M1711 Unilateral primary osteoarthritis, right knee: Secondary | ICD-10-CM | POA: Diagnosis not present

## 2023-02-06 DIAGNOSIS — M1711 Unilateral primary osteoarthritis, right knee: Secondary | ICD-10-CM | POA: Diagnosis not present

## 2023-02-13 DIAGNOSIS — M1711 Unilateral primary osteoarthritis, right knee: Secondary | ICD-10-CM | POA: Diagnosis not present

## 2023-02-28 DIAGNOSIS — Z23 Encounter for immunization: Secondary | ICD-10-CM | POA: Diagnosis not present

## 2023-03-04 ENCOUNTER — Other Ambulatory Visit: Payer: Self-pay | Admitting: Internal Medicine

## 2023-03-05 DIAGNOSIS — Z23 Encounter for immunization: Secondary | ICD-10-CM | POA: Diagnosis not present

## 2023-03-13 ENCOUNTER — Telehealth: Payer: Self-pay | Admitting: Internal Medicine

## 2023-03-13 DIAGNOSIS — K862 Cyst of pancreas: Secondary | ICD-10-CM | POA: Diagnosis not present

## 2023-03-13 DIAGNOSIS — N183 Chronic kidney disease, stage 3 unspecified: Secondary | ICD-10-CM | POA: Diagnosis not present

## 2023-03-13 DIAGNOSIS — D631 Anemia in chronic kidney disease: Secondary | ICD-10-CM | POA: Diagnosis not present

## 2023-03-13 DIAGNOSIS — N2581 Secondary hyperparathyroidism of renal origin: Secondary | ICD-10-CM | POA: Diagnosis not present

## 2023-03-13 DIAGNOSIS — N281 Cyst of kidney, acquired: Secondary | ICD-10-CM | POA: Diagnosis not present

## 2023-03-13 NOTE — Telephone Encounter (Signed)
[facility] called, requesting that Pt's labs be faxed over.  Informed MD is OOO.  [phone]

## 2023-03-18 NOTE — Telephone Encounter (Signed)
Labs were e-faxed.

## 2023-03-20 DIAGNOSIS — M1711 Unilateral primary osteoarthritis, right knee: Secondary | ICD-10-CM | POA: Diagnosis not present

## 2023-04-22 DIAGNOSIS — Z1231 Encounter for screening mammogram for malignant neoplasm of breast: Secondary | ICD-10-CM | POA: Diagnosis not present

## 2023-04-23 ENCOUNTER — Telehealth: Payer: Self-pay

## 2023-04-23 NOTE — Telephone Encounter (Signed)
Auth Submission: NO AUTH NEEDED Site of care: Site of care: [facility] Payer: Medicare A/B with AARP supplement Medication & CPT/J Code(s) submitted: Prolia (Denosumab) [id number] Route of submission (phone, fax, portal):  Phone # Fax # Auth type: Buy/Bill PB Units/visits requested: 60mg  x 1 dose Reference number:  Approval from: 04/23/23 to 08/16/23

## 2023-04-24 ENCOUNTER — Encounter: Payer: Self-pay | Admitting: Internal Medicine

## 2023-04-24 ENCOUNTER — Ambulatory Visit: Payer: Medicare Other | Admitting: Internal Medicine

## 2023-04-24 ENCOUNTER — Other Ambulatory Visit: Payer: Self-pay | Admitting: Internal Medicine

## 2023-04-24 VITALS — BP 120/80 | HR 75 | Temp 98.1°F | Ht 65.5 in | Wt 154.8 lb

## 2023-04-24 DIAGNOSIS — E039 Hypothyroidism, unspecified: Secondary | ICD-10-CM | POA: Diagnosis not present

## 2023-04-24 DIAGNOSIS — M810 Age-related osteoporosis without current pathological fracture: Secondary | ICD-10-CM | POA: Diagnosis not present

## 2023-04-24 DIAGNOSIS — Z0000 Encounter for general adult medical examination without abnormal findings: Secondary | ICD-10-CM | POA: Diagnosis not present

## 2023-04-24 DIAGNOSIS — E782 Mixed hyperlipidemia: Secondary | ICD-10-CM

## 2023-04-24 LAB — CBC WITH DIFFERENTIAL/PLATELET
Basophils Absolute: 0 10*3/uL (ref 0.0–0.1)
Basophils Relative: 0.5 % (ref 0.0–3.0)
Eosinophils Absolute: 0.2 10*3/uL (ref 0.0–0.7)
Eosinophils Relative: 2.8 % (ref 0.0–5.0)
HCT: 36.8 % (ref 36.0–46.0)
Hemoglobin: 12.3 g/dL (ref 12.0–15.0)
Lymphocytes Relative: 30.3 % (ref 12.0–46.0)
Lymphs Abs: 2.1 10*3/uL (ref 0.7–4.0)
MCHC: 33.5 g/dL (ref 30.0–36.0)
MCV: 92.7 fL (ref 78.0–100.0)
Monocytes Absolute: 0.6 10*3/uL (ref 0.1–1.0)
Monocytes Relative: 8.7 % (ref 3.0–12.0)
Neutro Abs: 3.9 10*3/uL (ref 1.4–7.7)
Neutrophils Relative %: 57.7 % (ref 43.0–77.0)
Platelets: 280 10*3/uL (ref 150.0–400.0)
RBC: 3.97 Mil/uL (ref 3.87–5.11)
RDW: 13.6 % (ref 11.5–15.5)
WBC: 6.8 10*3/uL (ref 4.0–10.5)

## 2023-04-24 LAB — COMPREHENSIVE METABOLIC PANEL
ALT: 7 U/L (ref 0–35)
AST: 14 U/L (ref 0–37)
Albumin: 4.4 g/dL (ref 3.5–5.2)
Alkaline Phosphatase: 23 U/L — ABNORMAL LOW (ref 39–117)
BUN: 29 mg/dL — ABNORMAL HIGH (ref 6–23)
CO2: 26 meq/L (ref 19–32)
Calcium: 9.3 mg/dL (ref 8.4–10.5)
Chloride: 105 meq/L (ref 96–112)
Creatinine, Ser: 0.97 mg/dL (ref 0.40–1.20)
GFR: 53.29 mL/min — ABNORMAL LOW (ref >60.00)
Glucose, Bld: 89 mg/dL (ref 70–99)
Potassium: 4.1 meq/L (ref 3.5–5.1)
Sodium: 138 meq/L (ref 135–145)
Total Bilirubin: 0.6 mg/dL (ref 0.2–1.2)
Total Protein: 7 g/dL (ref 6.0–8.3)

## 2023-04-24 LAB — LIPID PANEL
Cholesterol: 193 mg/dL (ref 0–200)
HDL: 65.9 mg/dL (ref >39.00)
LDL Cholesterol: 113 mg/dL — ABNORMAL HIGH (ref 0–99)
NonHDL: 127
Total CHOL/HDL Ratio: 3
Triglycerides: 72 mg/dL (ref 0.0–149.0)
VLDL: 14.4 mg/dL (ref 0.0–40.0)

## 2023-04-24 LAB — TSH: TSH: 1.17 u[IU]/mL (ref 0.35–5.50)

## 2023-04-24 NOTE — Progress Notes (Signed)
Established Patient Office Visit     CC/Reason for Visit: Subsequent Medicare well this visit  HPI: Nancy [name] is a 87 y.o. female who is coming in today for the above mentioned reasons. Past Medical History is significant for: Osteoporosis, hypothyroidism, hyperlipidemia.  Feeling well without concerns or complaints.  Has routine eye and dental care.  All immunizations are up-to-date.  She is due for a bone density test.  She had a mammogram with her GYN this week.   Past Medical/Surgical History: Past Medical History:  Diagnosis Date   Allergy    Avascular necrosis (HCC)    RIGHT SHOULDER   Blood transfusion without reported diagnosis    With shoulder surgery, 2019   Chronic cough    saw several specialists per her report, NONE NOW   Closed 4-part fracture of proximal end of right humerus, with malunion, subsequent encounter with AVN 03/18/2018   Hypothyroidism    Osteoporosis    Sees Dr. Cleophas Dunker at [facility], on prolia   PONV (postoperative nausea and vomiting)    severe post op nausea and vomiting   Postoperative anemia due to acute blood loss 03/19/2018    Past Surgical History:  Procedure Laterality Date   ABDOMINAL HYSTERECTOMY  1975   PARTIAL; had ovarian cysts. no cancer concerns.   APPENDECTOMY     CATARACT EXTRACTION Right    08/2020   LAPAROSCOPY     removal of ovaries   OOPHORECTOMY Bilateral 2007   removal growth thyroid  1998   REVERSE SHOULDER ARTHROPLASTY Right 03/18/2018   Procedure: REVERSE RIGHT SHOULDER ARTHROPLASTY;  Surgeon: Teryl Lucy, MD;  Location: [facility];  Service: Orthopedics;  Laterality: Right;   TONSILLECTOMY     TRIGGER FINGER RELEASE Left 04/06/2019   Procedure: DUPUYTREN 'S AND CONTRACTURE RELEASE LEFT FOURTH FINGER;  Surgeon: Louisa Second, MD;  Location: [facility];  Service: Plastics;  Laterality: Left;    Social History:  reports that she has never smoked. She has never used smokeless  tobacco. She reports that she does not drink alcohol and does not use drugs.  Allergies: Allergies  Allergen Reactions   Norco [Hydrocodone-Acetaminophen] Hives   Penicillins Hives    Has patient had a PCN reaction causing immediate rash, facial/tongue/throat swelling, SOB or lightheadedness with hypotension: yes Has patient had a PCN reaction causing severe rash involving mucus membranes or skin necrosis: no Has patient had a PCN reaction that required hospitalization: no Has patient had a PCN reaction occurring within the last 10 years: yes If all of the above answers are "NO", then may proceed with Cephalosporin use.    Sulfamethoxazole Hives    Family History:  Family History  Problem Relation Age of Onset   Dementia Mother    Colon cancer Mother 13   Parkinsonism Father    Hypertension Father    Colon cancer Sister 69   Hypertension Sister    Stroke Sister    Osteoporosis Other    Stomach cancer Neg Hx    Colon polyps Neg Hx    Esophageal cancer Neg Hx    Rectal cancer Neg Hx      Current Outpatient Medications:    CALCIUM PO, Take 600 mg by mouth daily., Disp: , Rfl:    cholecalciferol (VITAMIN D3) 25 MCG (1000 UT) tablet, Take 2,000 Units by mouth daily. , Disp: , Rfl:    eszopiclone 3 MG TABS, Take 1 tablet (3 mg total) by mouth at  bedtime as needed. Take immediately before bedtime, Disp: 90 tablet, Rfl: 3   levothyroxine (SYNTHROID) 50 MCG tablet, TAKE 1 TABLET BY MOUTH DAILY, Disp: 90 tablet, Rfl: 0   loratadine (CLARITIN) 10 MG tablet, Take 10 mg by mouth as needed., Disp: , Rfl:    Multiple Vitamins-Minerals (ZINC PO), Take 50 mg by mouth., Disp: , Rfl:    Omega-3 Fatty Acids (FISH OIL PO), Take 1,200 mg by mouth., Disp: , Rfl:    PROLIA 60 MG/ML SOSY injection, Inject into the skin once., Disp: , Rfl:    zinc gluconate 50 MG tablet, Take 50 mg by mouth daily., Disp: , Rfl:   Current Facility-Administered Medications:    0.9 %  sodium chloride infusion, 500  mL, Intravenous, Once, Hilarie Fredrickson, MD  Review of Systems:  Negative unless indicated in HPI.   Physical Exam: Vitals:   04/24/23 0840  BP: 120/80  Pulse: 75  Temp: 98.1 F (36.7 C)  TempSrc: Oral  SpO2: 99%  Weight: 154 lb 12.8 oz (70.2 kg)  Height: 5' 5.5" (1.664 m)    Body mass index is 25.37 kg/m.   Physical Exam Vitals reviewed.  Constitutional:      General: She is not in acute distress.    Appearance: Normal appearance. She is not ill-appearing, toxic-appearing or diaphoretic.  HENT:     Head: Normocephalic.     Right Ear: Tympanic membrane, ear canal and external ear normal. There is no impacted cerumen.     Left Ear: Tympanic membrane, ear canal and external ear normal. There is no impacted cerumen.     Nose: Nose normal.     Mouth/Throat:     Mouth: Mucous membranes are moist.     Pharynx: Oropharynx is clear. No oropharyngeal exudate or posterior oropharyngeal erythema.  Eyes:     General: No scleral icterus.       Right eye: No discharge.        Left eye: No discharge.     Conjunctiva/sclera: Conjunctivae normal.     Pupils: Pupils are equal, round, and reactive to light.  Neck:     Vascular: No carotid bruit.  Cardiovascular:     Rate and Rhythm: Normal rate and regular rhythm.     Pulses: Normal pulses.     Heart sounds: Normal heart sounds.  Pulmonary:     Effort: Pulmonary effort is normal. No respiratory distress.     Breath sounds: Normal breath sounds.  Abdominal:     General: Abdomen is flat. Bowel sounds are normal.     Palpations: Abdomen is soft.  Musculoskeletal:        General: Normal range of motion.     Cervical back: Normal range of motion.  Skin:    General: Skin is warm and dry.  Neurological:     General: No focal deficit present.     Mental Status: She is alert and oriented to person, place, and time. Mental status is at baseline.  Psychiatric:        Mood and Affect: Mood normal.        Behavior: Behavior normal.         Thought Content: Thought content normal.        Judgment: Judgment normal.    Subsequent Medicare wellness visit   1. Risk factors, based on past  M,S,F -     2.  Physical activities: Dietary issues and exercise activities discussed:      3.  Depression/mood:  Flowsheet Row Office Visit from [facility]/24/20[facility] in [facility] Total Score 2        4.  ADL's:    06/27/2022    1:42 PM  In your present state of health, do you have any difficulty performing the following activities:  Hearing? 0  Vision? 0  Difficulty concentrating or making decisions? 0  Walking or climbing stairs? 0  Dressing or bathing? 0  Doing errands, shopping? 0  Preparing Food and eating ? N  Using the Toilet? N  In the past six months, have you accidently leaked urine? N  Do you have problems with loss of bowel control? N  Managing your Medications? N  Managing your Finances? N  Housekeeping or managing your Housekeeping? N     5.  Fall risk:     03/15/2021    9:15 AM 06/06/2021    8:33 AM 06/27/2022    1:43 PM 09/19/2022    1:07 PM 04/24/2023    8:37 AM  Fall Risk  Falls in the past year? 0 0 0 0 0  Was there an injury with Fall?   0 0 0  Fall Risk Category Calculator   0 0 0  Fall Risk Category (Retired)   Low    (RETIRED) Patient Fall Risk Level  Low fall risk Low fall risk    Patient at Risk for Falls Due to  Medication side effect No Fall Risks    Fall risk Follow up  Falls evaluation completed;Education provided;Falls prevention discussed Falls prevention discussed Falls evaluation completed Falls evaluation completed     6.  Home safety: No problems identified   7.  Height weight, and visual acuity: height and weight as above, vision/hearing: Vision Screening   Right eye Left eye Both eyes  Without correction 20/20 20/20 20/20   With correction        8.  Counseling: Counseling given: Not Answered    9. Lab orders based on risk factors: Laboratory  update will be reviewed   10. Cognitive assessment:        06/27/2022    1:43 PM 06/06/2021    8:34 AM 05/17/2020    2:42 PM  6CIT Screen  What Year? 0 points 0 points 0 points  What month? 0 points 0 points 0 points  What time? 0 points 0 points   Count back from 20 0 points 0 points 0 points  Months in reverse 0 points 0 points 0 points  Repeat phrase 0 points 0 points 0 points  Total Score 0 points 0 points      11. Screening: Patient provided with a written and personalized 5-10 year screening schedule in the AVS. Health Maintenance  Topic Date Due   COVID-19 Vaccine (6 - 2023-24 season) 04/14/2023   Medicare Annual Wellness Visit  04/23/2024   DTaP/Tdap/Td vaccine (4 - Td or Tdap) 01/16/2030   Pneumonia Vaccine  Completed   Flu Shot  Completed   DEXA scan (bone density measurement)  Completed   Zoster (Shingles) Vaccine  Completed   HPV Vaccine  Aged Out    12. Provider List Update: Patient Care Team    Relationship Specialty Notifications Start End  Philip Aspen, Limmie Kenniya, MD PCP - General Internal Medicine  09/19/22   Carrington Clamp, MD Consulting Physician Obstetrics and Gynecology  05/17/15   Loreta Ave, MD (Inactive) Consulting Physician Orthopedic Surgery  05/17/15   Melina Fiddler, MD Consulting Physician Sports  Medicine  05/17/15    Comment: for osteoporosis  Melina Fiddler, MD Consulting Physician Sports Medicine  10/05/15   Teryl Lucy, MD Consulting Physician Orthopedic Surgery  03/04/18      13. Advance Directives:    14. Opioids: Patient is not on any opioid prescriptions and has no risk factors for a substance use disorder.   15.   Goals       patient      Will continue to travel several times a year        Patient Stated (pt-stated)      None at this time      Patient Stated      06/06/2021, maintain         I have personally reviewed and noted the following in the patient's chart:   Medical and social  history Use of alcohol, tobacco or illicit drugs  Current medications and supplements Functional ability and status Nutritional status Physical activity Advanced directives List of other physicians Hospitalizations, surgeries, and ER visits in previous 12 months Vitals Screenings to include cognitive, depression, and falls Referrals and appointments  In addition, I have reviewed and discussed with patient certain preventive protocols, quality metrics, and best practice recommendations. A written personalized care plan for preventive services as well as general preventive health recommendations were provided to patient.   Impression and Plan:  Acquired hypothyroidism -     CBC with Differential/Platelet; Future -     TSH; Future  Mixed hyperlipidemia -     Comprehensive metabolic panel; Future -     Lipid panel; Future  Osteoporosis, unspecified osteoporosis type, unspecified pathological fracture presence -     DG DXA FRACTURE ASSESSMENT; Future   -Recommend routine eye and dental care. -Healthy lifestyle discussed in detail. -Labs to be updated today. -Prostate cancer screening: N/A Health Maintenance  Topic Date Due   COVID-19 Vaccine (6 - 2023-24 season) 04/14/2023   Medicare Annual Wellness Visit  04/23/2024   DTaP/Tdap/Td vaccine (4 - Td or Tdap) 01/16/2030   Pneumonia Vaccine  Completed   Flu Shot  Completed   DEXA scan (bone density measurement)  Completed   Zoster (Shingles) Vaccine  Completed   HPV Vaccine  Aged Out         Lavell Ridings Philip Aspen, MD  Primary Care at [facility]

## 2023-05-02 ENCOUNTER — Ambulatory Visit (HOSPITAL_BASED_OUTPATIENT_CLINIC_OR_DEPARTMENT_OTHER)
Admission: RE | Admit: 2023-05-02 | Discharge: 2023-05-02 | Disposition: A | Discharge status: Home / Self Care | Payer: Medicare Other | Source: Ambulatory Visit | Attending: Internal Medicine | Admitting: Internal Medicine

## 2023-05-02 DIAGNOSIS — M810 Age-related osteoporosis without current pathological fracture: Secondary | ICD-10-CM | POA: Diagnosis not present

## 2023-05-03 ENCOUNTER — Telehealth: Payer: Self-pay | Admitting: Internal Medicine

## 2023-05-03 NOTE — Telephone Encounter (Signed)
Returned call for bone density results

## 2023-05-07 NOTE — Telephone Encounter (Signed)
Left message on machine for patient to return our call.  See result note. ?

## 2023-05-10 ENCOUNTER — Telehealth: Payer: Self-pay

## 2023-05-10 NOTE — Telephone Encounter (Signed)
Pt ready for scheduling  Out-of-pocket cost due at time of visit: $0  Primary: Medicare co-insurance: $0   Secondary: AARP co-insurance: $0   Medical Benefit Details: Date Benefits were checked: 05/10/23 Deductible: $240 out of $240/ Coinsurance: 0/ Admin Fee: 0  Prior Auth: Not needed  ** This summary of benefits is an estimation of the patient's out-of-pocket cost. Exact cost may very based on individual plan coverage.

## 2023-05-13 NOTE — Telephone Encounter (Signed)
Left message on machine for patient to schedule a nurse visit.

## 2023-05-13 NOTE — Telephone Encounter (Signed)
Spoke to patient had nurse visit scheduled.

## 2023-05-30 ENCOUNTER — Other Ambulatory Visit: Payer: Self-pay | Admitting: Internal Medicine

## 2023-06-04 ENCOUNTER — Ambulatory Visit: Payer: Medicare Other | Admitting: *Deleted

## 2023-06-04 ENCOUNTER — Telehealth: Payer: Self-pay | Admitting: *Deleted

## 2023-06-04 DIAGNOSIS — M810 Age-related osteoporosis without current pathological fracture: Secondary | ICD-10-CM | POA: Diagnosis not present

## 2023-06-04 MED ORDER — ROMOSOZUMAB-AQQG 105 MG/1.17ML ~~LOC~~ SOSY: 210.0000 mg | PREFILLED_SYRINGE | SUBCUTANEOUS | Status: DC

## 2023-06-04 NOTE — Telephone Encounter (Signed)
Patient has read that levothyroxine (SYNTHROID) 50 MCG tablet can cause bone loss.  Please advise.  Okay to respond via MyChart.

## 2023-06-04 NOTE — Progress Notes (Signed)
Per orders of Dr. Ardyth Harps, injection of Evenity given by Kern Reap. Patient tolerated injection well.

## 2023-06-21 ENCOUNTER — Telehealth: Payer: Self-pay

## 2023-06-21 NOTE — Telephone Encounter (Signed)
 Evenity VOB initiated via [email or web address]  Last Evenity inj: 06/04/23 Next Evenity inj DUE: 07/04/23

## 2023-06-24 ENCOUNTER — Ambulatory Visit: Payer: Medicare Other

## 2023-06-24 NOTE — Telephone Encounter (Signed)
 Marland Kitchen

## 2023-06-24 NOTE — Telephone Encounter (Signed)
 Pt ready for scheduling for Evenity  on or after : 07/04/23  Out-of-pocket cost due at time of visit: $0  Number of injection/visits approved: -  Primary: [place]  Medicare Evenity  co-insurance: 0% Admin fee co-insurance: 0%  Secondary: AARP - MEDSUP Evenity  co-insurance: 100% Admin fee co-insurance: 100%  Medical Benefit Details: Date Benefits were checked: 06/21/23 Deductible: $257 (met)/ Coinsurance: 100%/ Admin Fee: 100%  Prior Auth: not required PA# Expiration Date:  # of doses approved:   Pharmacy benefit: Copay $-- If patient wants fill through the pharmacy benefit please send prescription to:  - , and include estimated need by date in rx notes. Pharmacy will ship medication directly to the office.  Patient not eligible for Evenity  Copay Card. Copay Card can make patient's cost as little as $25. Link to apply: [email or web address]://[email or web address]  ** This summary of benefits is an estimation of the patient's out-of-pocket cost. Exact cost may very based on individual plan coverage.

## 2023-06-27 MED ORDER — ROMOSOZUMAB-AQQG 105 MG/1.17ML ~~LOC~~ SOSY
210.0000 mg | PREFILLED_SYRINGE | Freq: Once | SUBCUTANEOUS | Status: AC
  Administered 2023-06-04: 210 mg via SUBCUTANEOUS

## 2023-06-27 MED ORDER — ROMOSOZUMAB-AQQG 105 MG/1.17ML ~~LOC~~ SOSY: 210.0000 mg | PREFILLED_SYRINGE | Freq: Once | SUBCUTANEOUS | Status: DC

## 2023-06-27 MED ORDER — ROMOSOZUMAB-AQQG 105 MG/1.17ML ~~LOC~~ SOSY: 210.0000 mg | PREFILLED_SYRINGE | SUBCUTANEOUS | Status: DC

## 2023-07-01 ENCOUNTER — Telehealth: Payer: Self-pay

## 2023-07-01 NOTE — Telephone Encounter (Signed)
 Auth Submission: NO AUTH NEEDED Site of care: Site of care: [facility] Payer: Medicare A/B with AARP supplement Medication & CPT/J Code(s) submitted: Prolia  (Denosumab ) [phone] Route of submission (phone, fax, portal):  Phone # Fax # Auth type: Buy/Bill PB Units/visits requested: 60mg  x 2 doses Reference number:  Approval from: 04/23/23 to 07/18/24

## 2023-07-05 ENCOUNTER — Ambulatory Visit (INDEPENDENT_AMBULATORY_CARE_PROVIDER_SITE_OTHER): Payer: Medicare Other

## 2023-07-05 VITALS — Ht 65.5 in | Wt 154.0 lb

## 2023-07-05 DIAGNOSIS — Z0000 Encounter for general adult medical examination without abnormal findings: Secondary | ICD-10-CM

## 2023-07-05 NOTE — Progress Notes (Signed)
Subjective:   Nancy [name] is a 88 y.o. female who presents for Medicare Annual (Subsequent) preventive examination.  Visit Complete: Virtual I connected with  Nancy [name] on 07/05/23 by a audio enabled telemedicine application and verified that I am speaking with the correct person using two identifiers.  Patient Location: Home  Provider Location: Home Office  I discussed the limitations of evaluation and management by telemedicine. The patient expressed understanding and agreed to proceed.  Vital Signs: Because this visit was a virtual/telehealth visit, some criteria may be missing or patient reported. Any vitals not documented were not able to be obtained and vitals that have been documented are patient reported.  Patient Medicare AWV questionnaire was completed by the patient on 07/02/23; I have confirmed that all information answered by patient is correct and no changes since this date.  Cardiac Risk Factors include: advanced age (>22men, >53 women)     Objective:    Today's Vitals   07/05/23 1548  Weight: 154 lb (69.9 kg)  Height: 5' 5.5" (1.664 m)   Body mass index is 25.24 kg/m.     07/05/2023    3:53 PM 06/27/2022    1:43 PM 06/06/2021    8:30 AM 05/17/2020    2:38 PM 03/30/2019   11:23 AM 03/18/2018    6:42 PM 03/13/2018   10:41 AM  Advanced Directives  Does Patient Have a Medical Advance Directive? Yes Yes Yes Yes Yes Yes Yes  Type of Estate agent of [place];Living will Healthcare Power of [place];Living will Healthcare Power of [place];Living will Living will;Out of facility DNR (pink MOST or yellow form) Healthcare Power of [place];Living will Healthcare Power of [place];Living will Healthcare Power of [place];Living will  Does patient want to make changes to medical advance directive?     No - Patient declined No - Patient declined No - Patient declined  Copy of Healthcare Power of Attorney in Chart? No - copy requested No -  copy requested No - copy requested   No - copy requested No - copy requested    Current Medications (verified) Outpatient Encounter Medications as of 07/05/2023  Medication Sig   CALCIUM PO Take 600 mg by mouth daily.   cholecalciferol (VITAMIN D3) 25 MCG (1000 UT) tablet Take 2,000 Units by mouth daily.    eszopiclone 3 MG TABS Take 1 tablet (3 mg total) by mouth at bedtime as needed. Take immediately before bedtime   levothyroxine (SYNTHROID) 50 MCG tablet TAKE 1 TABLET BY MOUTH DAILY   loratadine (CLARITIN) 10 MG tablet Take 10 mg by mouth as needed.   Multiple Vitamins-Minerals (ZINC PO) Take 50 mg by mouth.   Omega-3 Fatty Acids (FISH OIL PO) Take 1,200 mg by mouth.   PROLIA 60 MG/ML SOSY injection Inject into the skin once.   zinc gluconate 50 MG tablet Take 50 mg by mouth daily.   Facility-Administered Encounter Medications as of 07/05/2023  Medication   0.9 %  sodium chloride infusion    Allergies (verified) Norco [hydrocodone-acetaminophen], Penicillins, and Sulfamethoxazole   History: Past Medical History:  Diagnosis Date   Allergy    Avascular necrosis (HCC)    RIGHT SHOULDER   Blood transfusion without reported diagnosis    With shoulder surgery, 2019   Chronic cough    saw several specialists per her report, NONE NOW   Closed 4-part fracture of proximal end of right humerus, with malunion, subsequent encounter with AVN 03/18/2018   Hypothyroidism  Osteoporosis    Sees Dr. Cleophas Dunker at [facility], on prolia   PONV (postoperative nausea and vomiting)    severe post op nausea and vomiting   Postoperative anemia due to acute blood loss 03/19/2018   Past Surgical History:  Procedure Laterality Date   ABDOMINAL HYSTERECTOMY  1975   PARTIAL; had ovarian cysts. no cancer concerns.   APPENDECTOMY     CATARACT EXTRACTION Right    08/2020   LAPAROSCOPY     removal of ovaries   OOPHORECTOMY Bilateral 2007   removal growth thyroid  1998   REVERSE SHOULDER  ARTHROPLASTY Right 03/18/2018   Procedure: REVERSE RIGHT SHOULDER ARTHROPLASTY;  Surgeon: Teryl Lucy, MD;  Location: [facility];  Service: Orthopedics;  Laterality: Right;   TONSILLECTOMY     TRIGGER FINGER RELEASE Left 04/06/2019   Procedure: DUPUYTREN 'S AND CONTRACTURE RELEASE LEFT FOURTH FINGER;  Surgeon: Louisa Second, MD;  Location: [facility];  Service: Plastics;  Laterality: Left;   Family History  Problem Relation Age of Onset   Dementia Mother    Colon cancer Mother 14   Parkinsonism Father    Hypertension Father    Colon cancer Sister 82   Hypertension Sister    Stroke Sister    Osteoporosis Other    Stomach cancer Neg Hx    Colon polyps Neg Hx    Esophageal cancer Neg Hx    Rectal cancer Neg Hx    Social History   Socioeconomic History   Marital status: Widowed    Spouse name: Not on file   Number of children: Not on file   Years of education: Not on file   Highest education level: Not on file  Occupational History   Occupation: retired  Tobacco Use   Smoking status: Never   Smokeless tobacco: Never  Vaping Use   Vaping status: Never Used  Substance and Sexual Activity   Alcohol use: Never   Drug use: Never   Sexual activity: Not Currently  Other Topics Concern   Not on file  Social History Narrative   Work or School: retired      Ecologist Situation: lives alone, likes to travel      Spiritual Beliefs: Methodist      Lifestyle: regular walking, tries to eat healthy      Social Drivers of Corporate investment banker Strain: Low Risk  (07/05/2023)   Overall Financial Resource Strain (CARDIA)    Difficulty of Paying Living Expenses: Not hard at all  Food Insecurity: No Food Insecurity (07/05/2023)   Hunger Vital Sign    Worried About Running Out of Food in the Last Year: Never true    Ran Out of Food in the Last Year: Never true  Transportation Needs: No Transportation Needs (07/05/2023)   PRAPARE - Scientist, research (physical sciences) (Medical): No    Lack of Transportation (Non-Medical): No  Physical Activity: Sufficiently Active (07/05/2023)   Exercise Vital Sign    Days of Exercise per Week: 7 days    Minutes of Exercise per Session: 30 min  Stress: No Stress Concern Present (07/05/2023)   [employer] of Occupational Health - Occupational Stress Questionnaire    Feeling of Stress : Not at all  Social Connections: Moderately Integrated (07/05/2023)   Social Connection and Isolation Panel [NHANES]    Frequency of Communication with Friends and Family: More than three times a week    Frequency of Social Gatherings with Friends and  Family: More than three times a week    Attends Religious Services: More than 4 times per year    Active Member of Clubs or Organizations: Yes    Attends Banker Meetings: More than 4 times per year    Marital Status: Widowed    Tobacco Counseling Counseling given: Not Answered   Clinical Intake:  Pre-visit preparation completed: Yes  Pain : No/denies pain     BMI - recorded: 25.24 Nutritional Status: BMI 25 -29 Overweight Nutritional Risks: None Diabetes: No  How often do you need to have someone help you when you read instructions, pamphlets, or other written materials from your doctor or pharmacy?: 1 - Never  Interpreter Needed?: No  Information entered by :: Theresa Mulligan LPN   Activities of Daily Living    07/05/2023    3:52 PM 07/02/2023   11:29 AM  In your present state of health, do you have any difficulty performing the following activities:  Hearing? 0 0  Vision? 0 0  Difficulty concentrating or making decisions? 0 0  Walking or climbing stairs? 0 0  Dressing or bathing? 0 0  Doing errands, shopping? 0 0  Preparing Food and eating ? N N  Using the Toilet? N N  In the past six months, have you accidently leaked urine? N N  Do you have problems with loss of bowel control? N N  Managing your Medications? N N  Managing your  Finances? N N  Housekeeping or managing your Housekeeping? N N    Patient Care Team: Philip Aspen, Limmie Ronica, MD as PCP - General (Internal Medicine) Carrington Clamp, MD as Consulting Physician (Obstetrics and Gynecology) Loreta Ave, MD (Inactive) as Consulting Physician (Orthopedic Surgery) Melina Fiddler, MD as Consulting Physician (Sports Medicine) Melina Fiddler, MD as Consulting Physician (Sports Medicine) Teryl Lucy, MD as Consulting Physician (Orthopedic Surgery)  Indicate any recent Medical Services you may have received from other than [facility] providers in the past year (date may be approximate).     Assessment:   This is a routine wellness examination for Nakya.  Hearing/Vision screen Hearing Screening - Comments:: Denies hearing difficulties   Vision Screening - Comments:: Wears rx glasses - up to date with routine eye exams with  Dr Honor Loh   Goals Addressed               This Visit's Progress     Increase physical activity (pt-stated)        Stay Active.       Depression Screen    07/05/2023    3:52 PM 04/24/2023    8:37 AM 09/19/2022    1:31 PM 09/19/2022    1:07 PM 06/27/2022    1:42 PM 06/06/2021    8:33 AM 05/17/2020    2:37 PM  PHQ 2/9 Scores  PHQ - 2 Score 0 0 0  0 0 0  PHQ- 9 Score  2 2      Exception Documentation    Patient refusal       Fall Risk    07/05/2023    3:53 PM 07/02/2023   11:29 AM 04/24/2023    8:37 AM 09/19/2022    1:07 PM 06/27/2022    1:43 PM  Fall Risk   Falls in the past year? 0 0 0 0 0  Number falls in past yr: 0 0 0 0 0  Injury with Fall? 0  0 0 0  Risk for fall due to :  No Fall Risks    No Fall Risks  Follow up Falls prevention discussed  Falls evaluation completed Falls evaluation completed Falls prevention discussed    MEDICARE RISK AT HOME: Medicare Risk at Home Any stairs in or around the home?: Yes If so, are there any without handrails?: No Home free of loose throw rugs in walkways, pet  beds, electrical cords, etc?: Yes Adequate lighting in your home to reduce risk of falls?: Yes Life alert?: No Use of a cane, walker or w/c?: No Grab bars in the bathroom?: Yes Shower chair or bench in shower?: Yes Elevated toilet seat or a handicapped toilet?: Yes  TIMED UP AND GO:  Was the test performed?  No    Cognitive Function:        07/05/2023    3:53 PM 06/27/2022    1:43 PM 06/06/2021    8:34 AM 05/17/2020    2:42 PM  6CIT Screen  What Year? 0 points 0 points 0 points 0 points  What month? 0 points 0 points 0 points 0 points  What time? 0 points 0 points 0 points   Count back from 20 0 points 0 points 0 points 0 points  Months in reverse 0 points 0 points 0 points 0 points  Repeat phrase 0 points 0 points 0 points 0 points  Total Score 0 points 0 points 0 points     Immunizations Immunization History  Administered Date(s) Administered   Fluad Quad(high Dose 65+) 03/26/2019, 03/14/2020, 03/15/2021   Influenza Whole 04/17/2007, 04/06/2010   Influenza, High Dose Seasonal PF 05/27/2015, 03/15/2016, 03/03/2018, 02/17/2023   Influenza,inj,Quad PF,6+ Mos 02/23/2013   Influenza-Unspecified 03/17/2014, 02/17/2016, 03/18/2017   PFIZER(Purple Top)SARS-COV-2 Vaccination 09/07/2019, 09/30/2019, 05/20/2020   Pfizer Covid-19 Vaccine Bivalent Booster 14yrs & up 04/17/2021   Pfizer(Comirnaty)Fall Seasonal Vaccine 12 years and older 02/17/2023   Pneumococcal Conjugate-13 05/27/2015   Pneumococcal Polysaccharide-23 04/14/2008, 11/01/2017   Respiratory Syncytial Virus Vaccine,Recomb Aduvanted(Arexvy) 05/30/2022   Td 06/18/2005, 03/18/2009   Tdap 01/17/2020   Zoster Recombinant(Shingrix) 09/16/2017, 07/09/2018   Zoster, Live 04/27/2008    TDAP status: Up to date  Flu Vaccine status: Up to date  Pneumococcal vaccine status: Up to date  Covid-19 vaccine status: Declined, Education has been provided regarding the importance of this vaccine but patient still declined. Advised  may receive this vaccine at local pharmacy or Health Dept.or vaccine clinic. Aware to provide a copy of the vaccination record if obtained from local pharmacy or Health Dept. Verbalized acceptance and understanding.  Qualifies for Shingles Vaccine? Yes   Zostavax completed Yes   Shingrix Completed?: Yes  Screening Tests Health Maintenance  Topic Date Due   COVID-19 Vaccine (6 - 2024-25 season) 04/14/2023   Medicare Annual Wellness (AWV)  07/04/2024   DTaP/Tdap/Td (4 - Td or Tdap) 01/16/2030   Pneumonia Vaccine 42+ Years old  Completed   INFLUENZA VACCINE  Completed   DEXA SCAN  Completed   Zoster Vaccines- Shingrix  Completed   HPV VACCINES  Aged Out    Health Maintenance  Health Maintenance Due  Topic Date Due   COVID-19 Vaccine (6 - 2024-25 season) 04/14/2023        Bone Density status: Completed 05/02/23. Results reflect: Bone density results: OSTEOPOROSIS. Repeat every   years.    Additional Screening:    Vision Screening: Recommended annual ophthalmology exams for early detection of glaucoma and other disorders of the eye. Is the patient up to date with their annual eye exam?  Yes  Who is the provider or what is the name of the office in which the patient attends annual eye exams? Dr Honor Loh If pt is not established with a provider, would they like to be referred to a provider to establish care? No .   Dental Screening: Recommended annual dental exams for proper oral hygiene    Community Resource Referral / Chronic Care Management:  CRR required this visit?  No   CCM required this visit?  No     Plan:     I have personally reviewed and noted the following in the patient's chart:   Medical and social history Use of alcohol, tobacco or illicit drugs  Current medications and supplements including opioid prescriptions. Patient is not currently taking opioid prescriptions. Functional ability and status Nutritional status Physical activity Advanced  directives List of other physicians Hospitalizations, surgeries, and ER visits in previous 12 months Vitals Screenings to include cognitive, depression, and falls Referrals and appointments  In addition, I have reviewed and discussed with patient certain preventive protocols, quality metrics, and best practice recommendations. A written personalized care plan for preventive services as well as general preventive health recommendations were provided to patient.     Tillie Rung, LPN   7/84/6962   After Visit Summary: (MyChart) Due to this being a telephonic visit, the after visit summary with patients personalized plan was offered to patient via MyChart   Nurse Notes: None

## 2023-07-05 NOTE — Patient Instructions (Addendum)
Nancy [name] , Thank you for taking time to come for your Medicare Wellness Visit. I appreciate your ongoing commitment to your health goals. Please review the following plan we discussed and let me know if I can assist you in the future.   Referrals/Orders/Follow-Ups/Clinician Recommendations:   This is a list of the screening recommended for you and due dates:  Health Maintenance  Topic Date Due   COVID-19 Vaccine (6 - 2024-25 season) 04/14/2023   Medicare Annual Wellness Visit  07/04/2024   DTaP/Tdap/Td vaccine (4 - Td or Tdap) 01/16/2030   Pneumonia Vaccine  Completed   Flu Shot  Completed   DEXA scan (bone density measurement)  Completed   Zoster (Shingles) Vaccine  Completed   HPV Vaccine  Aged Out    Advanced directives: (Copy Requested) Please bring a copy of your health care power of attorney and living will to the office to be added to your chart at your convenience.  Next Medicare Annual Wellness Visit scheduled for next year: Yes

## 2023-07-24 ENCOUNTER — Ambulatory Visit: Payer: Medicare Other

## 2023-08-15 ENCOUNTER — Other Ambulatory Visit: Payer: Self-pay | Admitting: Internal Medicine

## 2023-09-13 DIAGNOSIS — Z961 Presence of intraocular lens: Secondary | ICD-10-CM | POA: Diagnosis not present

## 2023-09-17 IMAGING — MR MR ABDOMEN WO/W CM
16 of 18 series · 42 of 48 positions shown · IV contrast (multihance)
Comparison: Renal ultrasound 07/31/2021

CLINICAL DATA: Renal cyst

EXAM:
MRI ABDOMEN WITHOUT AND WITH CONTRAST
TECHNIQUE: Multiplanar multisequence MR imaging of the abdomen was performed
both before and after the administration of intravenous contrast.
CONTRAST:  15mL MULTIHANCE GADOBENATE DIMEGLUMINE 529 MG/ML IV SOLN

[Series 3: T2 · coronal · 5.0mm · 0.74mm/px · 2 of 19 slices shown (1 of 4)]
[im 1/19]
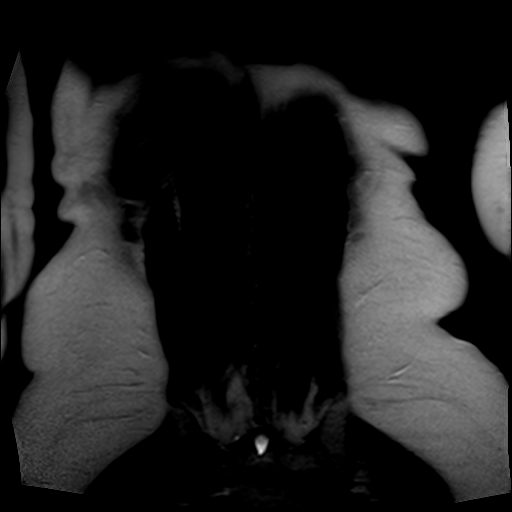
[im 19/19]
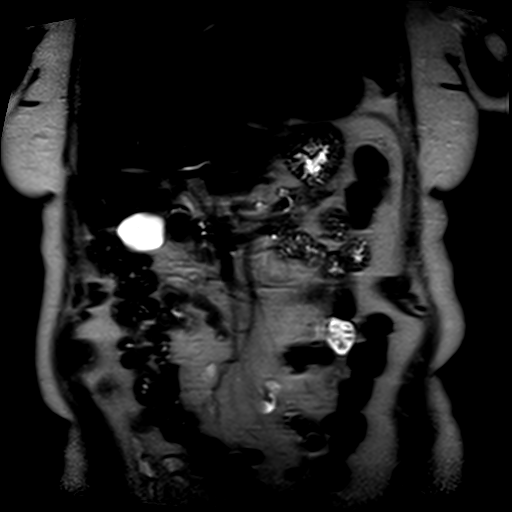

[Series 4: T2 · axial · 5.0mm · 0.68mm/px · z∈[-13,+157]mm · 2 of 30 slices shown (2 of 4)]
[im 1/30]
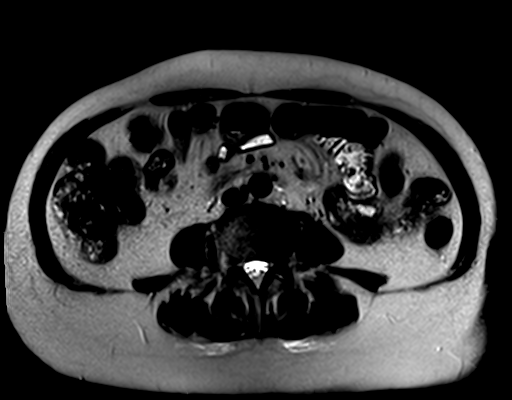
[im 30/30]
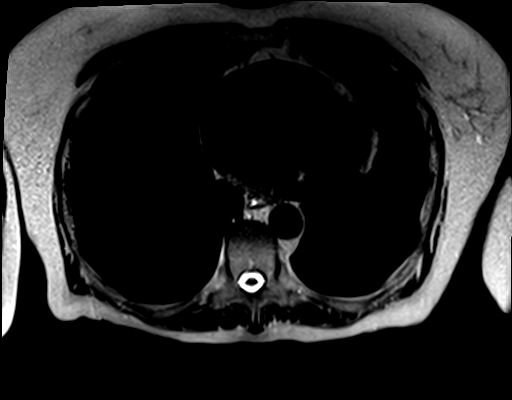

[Series 5: bSSFP · axial · 4.0mm · 0.68mm/px · z∈[-12,+156]mm · 3 of 44 slices shown]
[im 1/44]
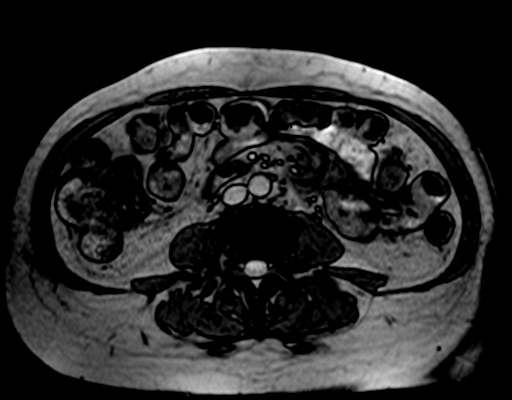
[im 22/44]
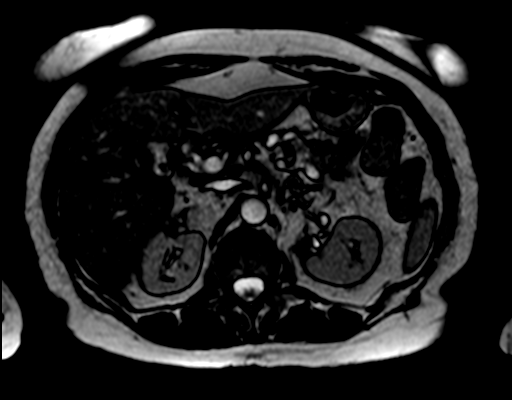
[im 44/44]
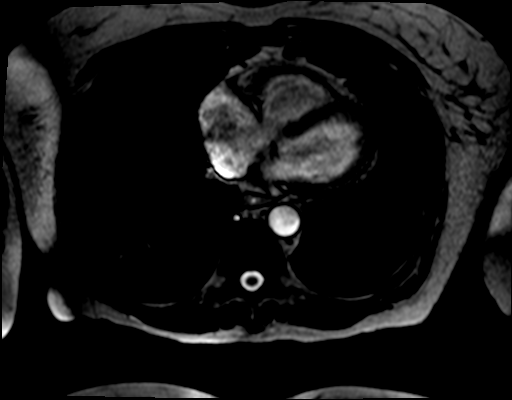

[Series 6: T1 · axial · 5.0mm · 0.68mm/px · z∈[-4,+148]mm · 4 of 54 slices shown]
[im 1/54]
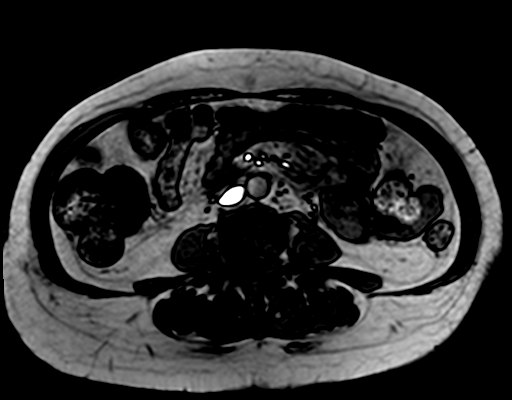
[im 18/54]
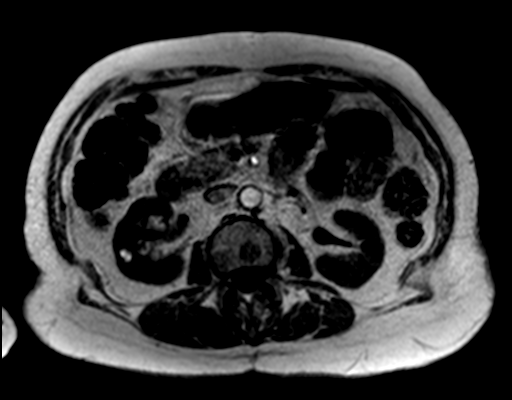
[im 36/54]
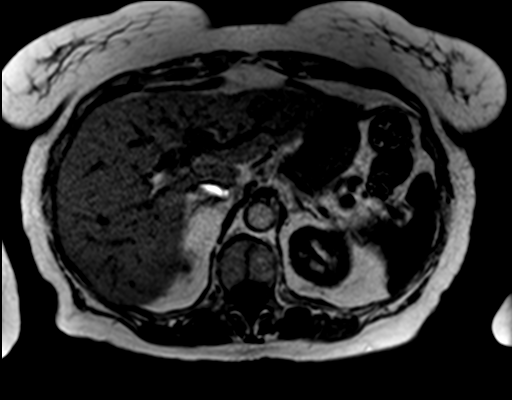
[im 54/54]
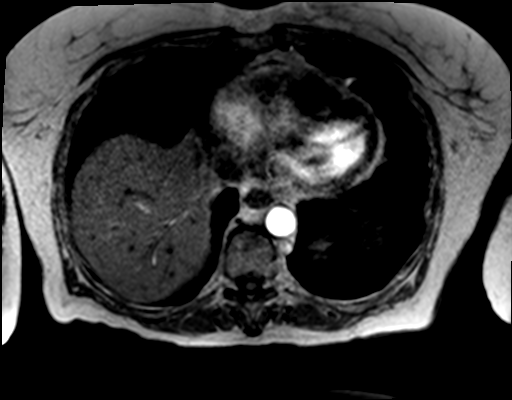

[Series 7: ep2d_diff_b50_500_800_p2_trig · axial · 5.0mm · 1.82mm/px · z∈[-14,+141]mm · 5 of 81 slices shown]
[im 1/81]
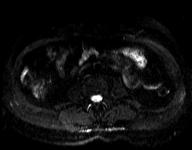
[im 21/81]
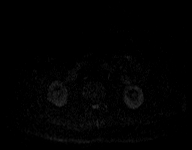
[im 41/81]
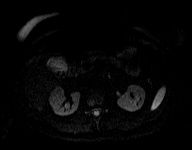
[im 61/81]
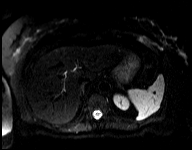
[im 81/81]
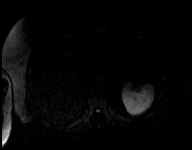

[Series 8: ep2d_diff_b50_500_800_p2_trig_adc · axial · 5.0mm · 1.82mm/px · 1 of 27 slices shown]
[im 1/27]
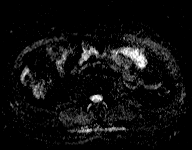

[Series 9: T2 · axial · 5.0mm · 1.37mm/px · 1 of 27 slices shown (3 of 4)]
[im 1/27]
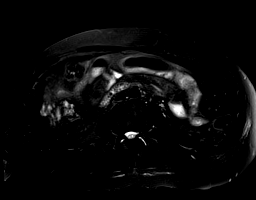

[Series 10: T2 · coronal · 2.5mm · 0.70mm/px · 1 of 28 slices shown (4 of 4)]
[im 1/28]
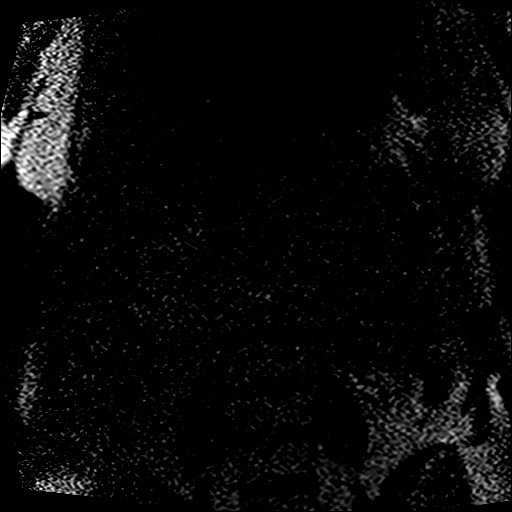

[Series 12: T1 dynamic · axial · non-contrast · 2.3mm · 1.37mm/px · z∈[-19,+143]mm · 3 of 72 slices shown]
[im 1/72]
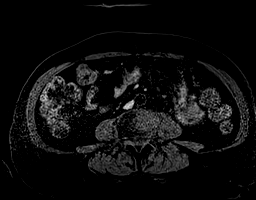
[im 36/72]
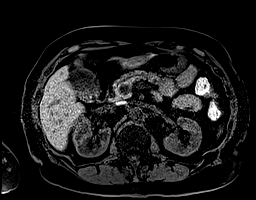
[im 72/72]
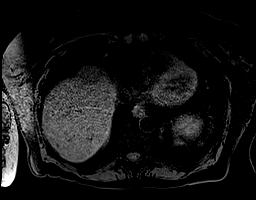

[Series 13: post 25 sec · axial · 2.3mm · 1.37mm/px · z∈[-19,+143]mm · 3 of 72 slices shown]
[im 1/72]
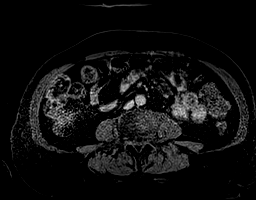
[im 36/72]
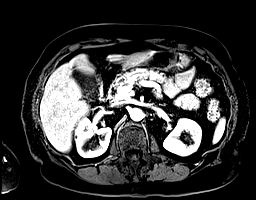
[im 72/72]
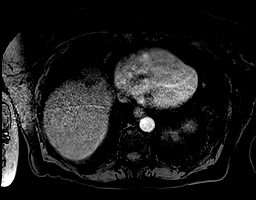

[Series 14: post 25 sec_sub · axial · 2.3mm · 1.37mm/px · z∈[-19,+143]mm · 3 of 72 slices shown]
[im 1/72]
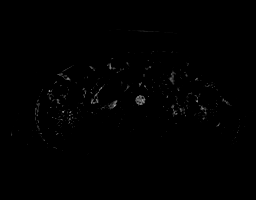
[im 36/72]
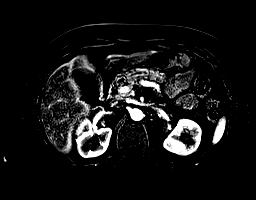
[im 72/72]
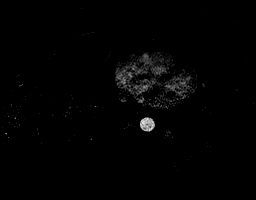

[Series 15: post 45 sec · axial · 2.3mm · 1.37mm/px · z∈[-19,+143]mm · 3 of 72 slices shown]
[im 1/72]
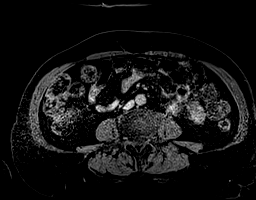
[im 36/72]
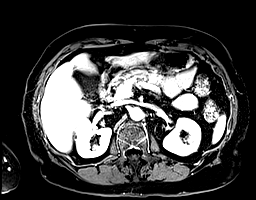
[im 72/72]
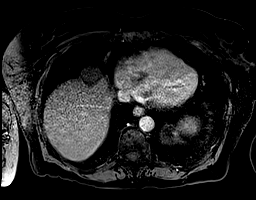

[Series 16: post 45 sec_sub · axial · 2.3mm · 1.37mm/px · z∈[-19,+143]mm · 3 of 72 slices shown]
[im 1/72]
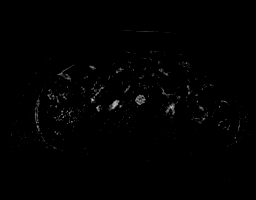
[im 36/72]
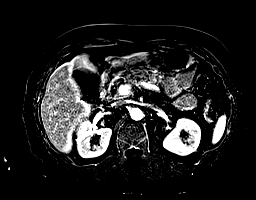
[im 72/72]
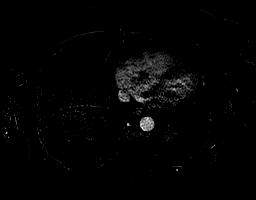

[Series 17: post 90 sec · axial · 2.3mm · 1.37mm/px · z∈[-19,+143]mm · 3 of 72 slices shown]
[im 1/72]
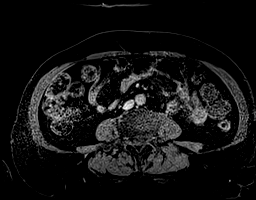
[im 36/72]
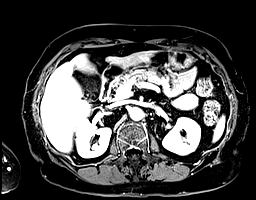
[im 72/72]
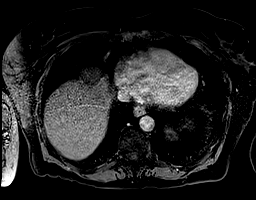

[Series 18: post 90 sec_sub · axial · 2.3mm · 1.37mm/px · z∈[-19,+143]mm · 3 of 72 slices shown]
[im 1/72]
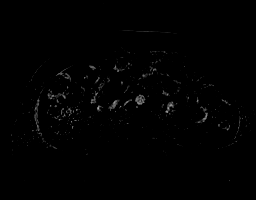
[im 36/72]
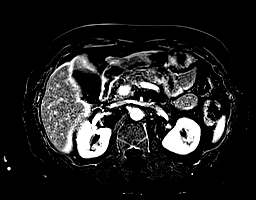
[im 72/72]
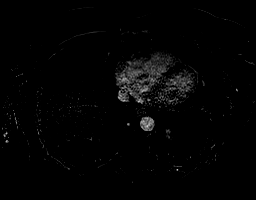

[Series 19: T1 dynamic post-contrast · coronal · 2.5mm · 0.74mm/px · 2 of 52 slices shown]
[im 1/52]
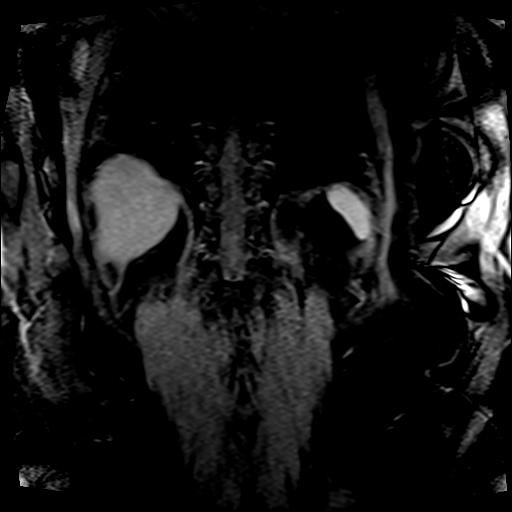
[im 52/52]
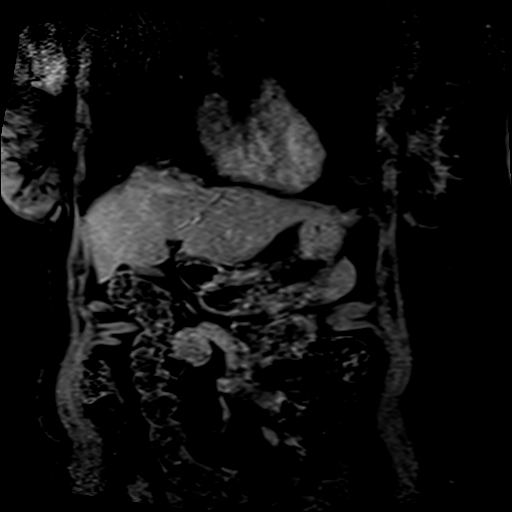

[42 of 48 positions shown; findings below may reference images not displayed]

FINDINGS: Lower chest: No acute findings.

Hepatobiliary: Liver is normal in size and contour. There is a 3.5 x
2.9 cm cyst with thin internal septations identified at the dome of
the right hepatic lobe, with no suspicious enhancement visualized.
Several additional smaller hepatic cysts scattered throughout the
liver. Cholelithiasis with no gallbladder wall thickening or
pericholecystic fluid. No biliary ductal dilatation identified.

Pancreas: 8 mm simple appearing cystic lesion in the superior body
of the pancreas. No pancreatic ductal dilatation. Pancreatic divisum
noted.

Spleen:  Within normal limits in size and appearance.

Adrenals/Urinary Tract: Adrenal glands appear normal. 1 cm
hemorrhagic cyst in the mid right kidney. 0.9 cm cyst in the
posterior mid right kidney demonstrates a thin partial septation
superiorly. A few additional small simple appearing renal cortical
cysts identified bilaterally. No hydronephrosis.

Stomach/Bowel: Small hiatal hernia. No bowel obstruction visualized.

Vascular/Lymphatic: No pathologically enlarged lymph nodes
identified. No abdominal aortic aneurysm demonstrated.

Other:  No ascites.

Musculoskeletal: No suspicious bone lesions identified.
IMPRESSION: 1. Small hemorrhagic cyst and adjacent small cyst with partial
septation visualized in the mid right kidney.
2. 8 mm simple appearing cystic lesion in the body of the pancreas.
No pancreatic ductal dilatation. Recommend follow-up MRI abdomen
with contrast in 12 months.
3. Multiple hepatic cysts including a mildly complex cyst at the
dome of the right hepatic lobe.
4. Small hiatal hernia.
5. Cholelithiasis.

## 2023-11-07 DIAGNOSIS — L820 Inflamed seborrheic keratosis: Secondary | ICD-10-CM | POA: Diagnosis not present

## 2023-11-07 DIAGNOSIS — L538 Other specified erythematous conditions: Secondary | ICD-10-CM | POA: Diagnosis not present

## 2023-11-07 DIAGNOSIS — L2989 Other pruritus: Secondary | ICD-10-CM | POA: Diagnosis not present

## 2023-11-18 ENCOUNTER — Telehealth: Payer: Self-pay | Admitting: *Deleted

## 2023-11-18 NOTE — Telephone Encounter (Signed)
 Copied from CRM [id number]. Topic: Clinical - Prescription Issue >> Nov 18, 2023 10:01 AM Jalayah J wrote: Reason for CRM: Prolia  injection in January completed, and needs clarity on next shot.

## 2023-11-19 NOTE — Telephone Encounter (Signed)
 She had Evenity  in December, is she switching to Prolia  already?

## 2023-11-19 NOTE — Telephone Encounter (Signed)
 Called and explained to the patient that she should take Evenity  once a month for a year, then switch to Prolia .  Appointment scheduled.

## 2023-11-20 ENCOUNTER — Telehealth: Payer: Self-pay

## 2023-11-20 ENCOUNTER — Ambulatory Visit (INDEPENDENT_AMBULATORY_CARE_PROVIDER_SITE_OTHER)

## 2023-11-20 DIAGNOSIS — M810 Age-related osteoporosis without current pathological fracture: Secondary | ICD-10-CM | POA: Diagnosis not present

## 2023-11-20 MED ORDER — ROMOSOZUMAB-AQQG 105 MG/1.17ML ~~LOC~~ SOSY
210.0000 mg | PREFILLED_SYRINGE | Freq: Once | SUBCUTANEOUS | Status: AC
  Administered 2023-11-20: 210 mg via SUBCUTANEOUS

## 2023-11-20 NOTE — Progress Notes (Signed)
 Per orders of Dr. Ardyth Harps, injection of Evenity given by Kern Reap. Patient tolerated injection well.

## 2023-11-20 NOTE — Telephone Encounter (Signed)
 Nancy [name]

## 2023-11-20 NOTE — Telephone Encounter (Signed)
 Pt ready for scheduling for EVENITY  on or after : 12/20/23  Option# 1 Buy/Bill (Office supplied medication)  Out-of-pocket cost due at time of  office visit: $0  Number of injection/visits approved: ---  Primary: MEDICARE Evenity  co-insurance: 0% Admin fee co-insurance: 0%  Secondary: AARP-MEDSUP Evenity  co-insurance: Covers the Medicare Part B deductible, co-insurance and 100% of the excess charges. Admin fee co-insurance:   Medical Benefit Details: Date Benefits were checked: 11/20/23 Deductible: $202.64 Met of $257 Required/ Coinsurance: 0%/ Admin Fee: 0%  Prior Auth: N/A PA# Expiration Date:   # of doses approved: ------------------------------------------------------------------------- Option# 2- Med Obtained from pharmacy  Pharmacy benefit: Copay $--- (Paid to pharmacy) Admin Fee: --- (Pay at clinic)  Prior Auth: --- PA# Expiration Date:   # of doses approved:  If patient wants fill through the pharmacy benefit please send prescription to: ---, and include estimated need by date in rx notes. Pharmacy will ship medication directly to the office.  Patient NOT eligible for Evenity  Copay Card. Copay Card can make patient's cost as little as $25. Link to apply: [email or web address]   This summary of benefits is an estimation of the patient's out-of-pocket cost. Exact cost may very based on individual plan coverage.

## 2023-11-20 NOTE — Telephone Encounter (Signed)
 Evenity  VOB initiated via [email or web address]  Last Evenity  inj: 11/20/23 Next Evenity  inj DUE: 12/20/23

## 2023-12-19 DIAGNOSIS — L570 Actinic keratosis: Secondary | ICD-10-CM | POA: Diagnosis not present

## 2023-12-19 DIAGNOSIS — D225 Melanocytic nevi of trunk: Secondary | ICD-10-CM | POA: Diagnosis not present

## 2023-12-19 DIAGNOSIS — L820 Inflamed seborrheic keratosis: Secondary | ICD-10-CM | POA: Diagnosis not present

## 2023-12-19 DIAGNOSIS — L821 Other seborrheic keratosis: Secondary | ICD-10-CM | POA: Diagnosis not present

## 2023-12-19 DIAGNOSIS — L814 Other melanin hyperpigmentation: Secondary | ICD-10-CM | POA: Diagnosis not present

## 2023-12-19 DIAGNOSIS — L538 Other specified erythematous conditions: Secondary | ICD-10-CM | POA: Diagnosis not present

## 2023-12-23 ENCOUNTER — Ambulatory Visit (INDEPENDENT_AMBULATORY_CARE_PROVIDER_SITE_OTHER): Admitting: *Deleted

## 2023-12-23 DIAGNOSIS — M810 Age-related osteoporosis without current pathological fracture: Secondary | ICD-10-CM

## 2023-12-23 MED ORDER — ROMOSOZUMAB-AQQG 105 MG/1.17ML ~~LOC~~ SOSY
210.0000 mg | PREFILLED_SYRINGE | Freq: Once | SUBCUTANEOUS | Status: AC
  Administered 2023-12-23: 210 mg via SUBCUTANEOUS

## 2023-12-23 NOTE — Progress Notes (Signed)
 Per orders of Dr. Ozell, 2 SQ injections of Evenity  105mg  (to right lateral thigh and left lateral thigh total 210mg ) given by Christyne Idell LABOR. Patient tolerated injection well.

## 2024-01-07 ENCOUNTER — Other Ambulatory Visit: Payer: Self-pay | Admitting: Internal Medicine

## 2024-01-23 ENCOUNTER — Ambulatory Visit (INDEPENDENT_AMBULATORY_CARE_PROVIDER_SITE_OTHER): Admitting: *Deleted

## 2024-01-23 DIAGNOSIS — M810 Age-related osteoporosis without current pathological fracture: Secondary | ICD-10-CM | POA: Diagnosis not present

## 2024-01-23 MED ORDER — ROMOSOZUMAB-AQQG 105 MG/1.17ML ~~LOC~~ SOSY
210.0000 mg | PREFILLED_SYRINGE | SUBCUTANEOUS | Status: AC
  Administered 2024-01-23: 210 mg via SUBCUTANEOUS

## 2024-01-23 NOTE — Progress Notes (Signed)
 Per orders of [employer], injection of Evenity  given by Vernell English.  Administer 210 mg dose as two separate subcutaneous injections of 105 mg given in the left thigh and 105 mg given in the right thigh.  With the total 210 mg given. Patient tolerated injection well.

## 2024-02-24 ENCOUNTER — Other Ambulatory Visit: Payer: Self-pay | Admitting: *Deleted

## 2024-02-24 ENCOUNTER — Ambulatory Visit

## 2024-02-24 MED ORDER — COVID-19 MRNA VACC (MODERNA) 50 MCG/0.5ML IM SUSP: 0.5000 mL | Freq: Once | INTRAMUSCULAR | 0 refills | Status: AC

## 2024-02-27 ENCOUNTER — Telehealth: Payer: Self-pay | Admitting: *Deleted

## 2024-02-27 NOTE — Telephone Encounter (Signed)
 Copied from CRM #[id number]. Topic: Clinical - Medical Advice >> Feb 27, 2024  3:58 PM Nancy [name] wrote: Reason for CRM: Patient called in following up regarding her Evenity , wanted to know when it will be in for patient to be scheduled for her next shot  Appointment scheduled.

## 2024-02-28 ENCOUNTER — Ambulatory Visit

## 2024-02-28 DIAGNOSIS — M810 Age-related osteoporosis without current pathological fracture: Secondary | ICD-10-CM | POA: Diagnosis not present

## 2024-02-28 MED ORDER — ROMOSOZUMAB-AQQG 105 MG/1.17ML ~~LOC~~ SOSY: 210.0000 mg | PREFILLED_SYRINGE | SUBCUTANEOUS | Status: DC

## 2024-02-28 MED ORDER — ROMOSOZUMAB-AQQG 105 MG/1.17ML ~~LOC~~ SOSY
210.0000 mg | PREFILLED_SYRINGE | SUBCUTANEOUS | Status: AC
  Administered 2024-02-28: 210 mg via SUBCUTANEOUS

## 2024-02-28 MED ORDER — ROMOSOZUMAB-AQQG 105 MG/1.17ML ~~LOC~~ SOSY
210.0000 mg | PREFILLED_SYRINGE | SUBCUTANEOUS | Status: AC
  Administered 2024-03-30: 210 mg via SUBCUTANEOUS

## 2024-02-28 NOTE — Progress Notes (Signed)
 Per orders of Dr. Ozell, 2 SQ injections of Evenity  105mg  (to right lateral thigh and left lateral thigh total 210mg ) given by Christyne Idell FELIX   given by Christyne Idell LABOR. Patient tolerated injection well.

## 2024-02-28 NOTE — Addendum Note (Signed)
 Addended by: DIONISIO CAMELIA PARAS on: 02/28/2024 04:04 PM   Modules accepted: Orders

## 2024-03-10 DIAGNOSIS — Z23 Encounter for immunization: Secondary | ICD-10-CM | POA: Diagnosis not present

## 2024-03-17 DIAGNOSIS — Z23 Encounter for immunization: Secondary | ICD-10-CM | POA: Diagnosis not present

## 2024-03-30 ENCOUNTER — Telehealth: Payer: Self-pay

## 2024-03-30 ENCOUNTER — Ambulatory Visit (INDEPENDENT_AMBULATORY_CARE_PROVIDER_SITE_OTHER)

## 2024-03-30 DIAGNOSIS — M810 Age-related osteoporosis without current pathological fracture: Secondary | ICD-10-CM

## 2024-03-30 MED ORDER — ROMOSOZUMAB-AQQG 105 MG/1.17ML ~~LOC~~ SOSY
210.0000 mg | PREFILLED_SYRINGE | SUBCUTANEOUS | Status: AC
  Administered 2024-04-29: 210 mg via SUBCUTANEOUS

## 2024-03-30 NOTE — Telephone Encounter (Deleted)
 SABRA

## 2024-03-30 NOTE — Addendum Note (Signed)
 Addended by: DIONISIO CAMELIA PARAS on: 03/30/2024 10:00 AM   Modules accepted: Orders

## 2024-03-30 NOTE — Telephone Encounter (Signed)
 Evenity  VOB initiated via [email or web address]  Last Evenity  inj: 03/30/24  Next Evenity  inj DUE: 04/30/24

## 2024-03-30 NOTE — Progress Notes (Signed)
 Patient is in office today for a nurse visit for Evenity  Injections per Dr. Dennard orders. Patient Evenity  1 Injection was given in the  Left arm. Patient tolerated injection well. Patient Evenity  2 Injection was given in the Right arm. Patient tolerated injection well.

## 2024-04-01 ENCOUNTER — Other Ambulatory Visit (HOSPITAL_COMMUNITY): Payer: Self-pay

## 2024-04-01 NOTE — Telephone Encounter (Signed)
 SABRA

## 2024-04-01 NOTE — Telephone Encounter (Signed)
 Pt ready for scheduling for EVENITY  on or after : 04/30/24  Option# 1 Buy/Bill (Office supplied medication)  Out-of-pocket cost due at time of  office visit: $0  Number of injection/visits approved: ---  Primary: MEDICARE Evenity  co-insurance: 0% Admin fee co-insurance: 0%  Secondary: AARP-MEDSUP Evenity  co-insurance:  Admin fee co-insurance:   Medical Benefit Details: Date Benefits were checked: 03/30/24 Deductible: $257 Met of $257 Required/ Coinsurance: 0%/ Admin Fee: 0%  Prior Auth: N/A PA# Expiration Date:   # of doses approved: ------------------------------------------------------------------------- Option# 2- Med Obtained from pharmacy  Pharmacy benefit: Copay $--- (Paid to pharmacy) Admin Fee: --- (Pay at clinic)  Prior Auth: --- PA# Expiration Date:   # of doses approved:  If patient wants fill through the pharmacy benefit please send prescription to: ---, and include estimated need by date in rx notes. Pharmacy will ship medication directly to the office.  Patient NOT eligible for Evenity  Copay Card. Copay Card can make patient's cost as little as $25. Link to apply: [email or web address]://[email or web address]   This summary of benefits is an estimation of the patient's out-of-pocket cost. Exact cost may very based on individual plan coverage.

## 2024-04-08 DIAGNOSIS — M25561 Pain in right knee: Secondary | ICD-10-CM | POA: Diagnosis not present

## 2024-04-08 DIAGNOSIS — M1711 Unilateral primary osteoarthritis, right knee: Secondary | ICD-10-CM | POA: Diagnosis not present

## 2024-04-15 DIAGNOSIS — M1711 Unilateral primary osteoarthritis, right knee: Secondary | ICD-10-CM | POA: Diagnosis not present

## 2024-04-22 DIAGNOSIS — M1711 Unilateral primary osteoarthritis, right knee: Secondary | ICD-10-CM | POA: Diagnosis not present

## 2024-04-23 DIAGNOSIS — Z1231 Encounter for screening mammogram for malignant neoplasm of breast: Secondary | ICD-10-CM | POA: Diagnosis not present

## 2024-04-23 DIAGNOSIS — Z01419 Encounter for gynecological examination (general) (routine) without abnormal findings: Secondary | ICD-10-CM | POA: Diagnosis not present

## 2024-04-29 ENCOUNTER — Ambulatory Visit (INDEPENDENT_AMBULATORY_CARE_PROVIDER_SITE_OTHER): Admitting: *Deleted

## 2024-04-29 DIAGNOSIS — M810 Age-related osteoporosis without current pathological fracture: Secondary | ICD-10-CM | POA: Diagnosis not present

## 2024-04-29 MED ORDER — ROMOSOZUMAB-AQQG 105 MG/1.17ML ~~LOC~~ SOSY: 210.0000 mg | PREFILLED_SYRINGE | SUBCUTANEOUS | Status: AC

## 2024-04-29 NOTE — Progress Notes (Signed)
 Per orders of Dr. Ardyth Harps, injection of Evenity given by Kern Reap. Patient tolerated injection well.

## 2024-04-30 DIAGNOSIS — M25561 Pain in right knee: Secondary | ICD-10-CM | POA: Diagnosis not present

## 2024-04-30 DIAGNOSIS — M1711 Unilateral primary osteoarthritis, right knee: Secondary | ICD-10-CM | POA: Diagnosis not present

## 2024-05-28 DIAGNOSIS — M1711 Unilateral primary osteoarthritis, right knee: Secondary | ICD-10-CM | POA: Diagnosis not present

## 2024-05-29 ENCOUNTER — Ambulatory Visit

## 2024-05-29 DIAGNOSIS — M810 Age-related osteoporosis without current pathological fracture: Secondary | ICD-10-CM

## 2024-05-29 MED ORDER — ROMOSOZUMAB-AQQG 105 MG/1.17ML ~~LOC~~ SOSY
210.0000 mg | PREFILLED_SYRINGE | Freq: Once | SUBCUTANEOUS | Status: AC
  Administered 2024-05-29: 210 mg via SUBCUTANEOUS

## 2024-05-29 MED ORDER — ROMOSOZUMAB-AQQG 105 MG/1.17ML ~~LOC~~ SOSY: 210.0000 mg | PREFILLED_SYRINGE | Freq: Once | SUBCUTANEOUS | Status: DC

## 2024-05-29 NOTE — Progress Notes (Signed)
 Patient is in office today for a nurse visit for Prolia  Injections. Patient was given injection in Left Anterior Thigh.

## 2024-06-17 ENCOUNTER — Ambulatory Visit: Admitting: Internal Medicine

## 2024-06-17 ENCOUNTER — Encounter: Payer: Self-pay | Admitting: Internal Medicine

## 2024-06-17 VITALS — BP 100/58 | HR 80 | Temp 97.9°F | Ht 65.5 in | Wt 161.0 lb

## 2024-06-17 DIAGNOSIS — E039 Hypothyroidism, unspecified: Secondary | ICD-10-CM

## 2024-06-17 DIAGNOSIS — J069 Acute upper respiratory infection, unspecified: Secondary | ICD-10-CM | POA: Diagnosis not present

## 2024-06-17 DIAGNOSIS — E782 Mixed hyperlipidemia: Secondary | ICD-10-CM | POA: Diagnosis not present

## 2024-06-17 DIAGNOSIS — Z01818 Encounter for other preprocedural examination: Secondary | ICD-10-CM | POA: Diagnosis not present

## 2024-06-17 MED ORDER — BENZONATATE 100 MG PO CAPS: 100.0000 mg | ORAL_CAPSULE | Freq: Two times a day (BID) | ORAL | 0 refills | Status: AC | PRN

## 2024-06-17 NOTE — Progress Notes (Signed)
 "    Established Patient Office Visit     CC/Reason for Visit: Preoperative clearance, URI symptoms  HPI: Nancy [name] is a 88 y.o. female who is coming in today for the above mentioned reasons. Past Medical History is significant for: Hypothyroidism, hyperlipidemia, osteoporosis.  I have not seen her in over a year.  In February she will be having a right total knee replacement and is needing preoperative clearance forms filled out.  For the past week she has been having URI symptoms.  She is improving but has a persistent cough.  Tessalon  Perles have helped in the past.   Past Medical/Surgical History: Past Medical History:  Diagnosis Date   Allergy    Avascular necrosis (HCC)    RIGHT SHOULDER   Blood transfusion without reported diagnosis    With shoulder surgery, 2019   Chronic cough    saw several specialists per her report, NONE NOW   Closed 4-part fracture of proximal end of right humerus, with malunion, subsequent encounter with AVN 03/18/2018   Hypothyroidism    Osteoporosis    Sees Dr. Orpha at [facility], on prolia    PONV (postoperative nausea and vomiting)    severe post op nausea and vomiting   Postoperative anemia due to acute blood loss 03/19/2018    Past Surgical History:  Procedure Laterality Date   ABDOMINAL HYSTERECTOMY  1975   PARTIAL; had ovarian cysts. no cancer concerns.   APPENDECTOMY     CATARACT EXTRACTION Right    08/2020   LAPAROSCOPY     removal of ovaries   OOPHORECTOMY Bilateral 2007   removal growth thyroid   1998   REVERSE SHOULDER ARTHROPLASTY Right 03/18/2018   Procedure: REVERSE RIGHT SHOULDER ARTHROPLASTY;  Surgeon: Josefina Chew, MD;  Location: [facility];  Service: Orthopedics;  Laterality: Right;   TONSILLECTOMY     TRIGGER FINGER RELEASE Left 04/06/2019   Procedure: DUPUYTREN 'S AND CONTRACTURE RELEASE LEFT FOURTH FINGER;  Surgeon: Marcus Lung, MD;  Location: [facility];  Service: Plastics;   Laterality: Left;    Social History:  reports that she has never smoked. She has never used smokeless tobacco. She reports that she does not drink alcohol and does not use drugs.  Allergies: Allergies[1]  Family History:  Family History  Problem Relation Age of Onset   Dementia Mother    Colon cancer Mother 65   Parkinsonism Father    Hypertension Father    Colon cancer Sister 46   Hypertension Sister    Stroke Sister    Osteoporosis Other    Stomach cancer Neg Hx    Colon polyps Neg Hx    Esophageal cancer Neg Hx    Rectal cancer Neg Hx     Current Medications[2]  Review of Systems:  Negative unless indicated in HPI.   Physical Exam: Vitals:   06/17/24 0948  BP: (!) 100/58  Pulse: 80  Temp: 97.9 F (36.6 C)  TempSrc: Oral  SpO2: 99%  Weight: 161 lb (73 kg)  Height: 5' 5.5 (1.664 m)    Body mass index is 26.38 kg/m.   Physical Exam Vitals reviewed.  Constitutional:      Appearance: Normal appearance.  HENT:     Head: Normocephalic and atraumatic.  Eyes:     Conjunctiva/sclera: Conjunctivae normal.  Cardiovascular:     Rate and Rhythm: Normal rate and regular rhythm.  Pulmonary:     Effort: Pulmonary effort is normal.     Breath sounds:  Normal breath sounds.  Skin:    General: Skin is warm and dry.  Neurological:     General: No focal deficit present.     Mental Status: She is alert and oriented to person, place, and time.  Psychiatric:        Mood and Affect: Mood normal.        Behavior: Behavior normal.        Thought Content: Thought content normal.        Judgment: Judgment normal.      Impression and Plan:  Preoperative clearance  Acquired hypothyroidism -     TSH; Future  Mixed hyperlipidemia -     CBC with Differential/Platelet; Future -     Comprehensive metabolic panel with GFR; Future -     Lipid panel; Future  Viral URI with cough -     Benzonatate ; Take 1 capsule (100 mg total) by mouth 2 (two) times daily as needed  for cough.  Dispense: 20 capsule; Refill: 0   -Given exam findings, PNA, pharyngitis, ear infection are not likely, hence abx have not been prescribed. -Have advised rest, fluids, OTC antihistamines, cough suppressants and mucinex. -RTC if no improvement in 10-14 days.  Tessalon  Perles given for cough. - Check TSH, lipids,, CMP, CBC.  Okay to proceed to surgery without further intervention.   Time spent:31 minutes reviewing chart, interviewing and examining patient and formulating plan of care.     [name], MD [facility]     [1]  Allergies Allergen Reactions   Norco [Hydrocodone-Acetaminophen ] Hives   Penicillins Hives    Has patient had a PCN reaction causing immediate rash, facial/tongue/throat swelling, SOB or lightheadedness with hypotension: yes Has patient had a PCN reaction causing severe rash involving mucus membranes or skin necrosis: no Has patient had a PCN reaction that required hospitalization: no Has patient had a PCN reaction occurring within the last 10 years: yes If all of the above answers are NO, then may proceed with Cephalosporin use.    Sulfamethoxazole Hives  [2]  Current Outpatient Medications:    benzonatate  (TESSALON ) 100 MG capsule, Take 1 capsule (100 mg total) by mouth 2 (two) times daily as needed for cough., Disp: 20 capsule, Rfl: 0   CALCIUM PO, Take 600 mg by mouth daily., Disp: , Rfl:    cholecalciferol (VITAMIN D3) 25 MCG (1000 UT) tablet, Take 2,000 Units by mouth daily. , Disp: , Rfl:    eszopiclone  3 MG TABS, Take 1 tablet (3 mg total) by mouth at bedtime as needed. Take immediately before bedtime, Disp: 90 tablet, Rfl: 3   levothyroxine  (SYNTHROID ) 50 MCG tablet, TAKE 1 TABLET BY MOUTH DAILY, Disp: 90 tablet, Rfl: 1   loratadine (CLARITIN) 10 MG tablet, Take 10 mg by mouth as needed., Disp: , Rfl:    Multiple Vitamins-Minerals (ZINC PO), Take 50 mg by mouth., Disp: , Rfl:    Omega-3 Fatty Acids  (FISH OIL  PO), Take 1,200 mg by mouth., Disp: , Rfl:    zinc gluconate 50 MG tablet, Take 50 mg by mouth daily., Disp: , Rfl:   Current Facility-Administered Medications:    0.9 %  sodium chloride  infusion, 500 mL, Intravenous, Once, Abran Norleen SAILOR, MD   [START ON 10/27/2024] Romosozumab -aqqg (EVENITY ) 105 MG/1. injection 210 mg, 210 mg, Subcutaneous, Q30 days, Theophilus Andrews, Tully GRADE, MD  "

## 2024-06-18 LAB — COMPREHENSIVE METABOLIC PANEL WITH GFR
AG Ratio: 1.6 (calc) (ref 1.0–2.5)
ALT: 11 U/L (ref 6–29)
AST: 19 U/L (ref 10–35)
Albumin: 4.2 g/dL (ref 3.6–5.1)
Alkaline phosphatase (APISO): 56 U/L (ref 37–153)
BUN/Creatinine Ratio: 23 (calc) — ABNORMAL HIGH (ref 6–22)
BUN: 26 mg/dL — ABNORMAL HIGH (ref 7–25)
CO2: 26 mmol/L (ref 20–32)
Calcium: 9.4 mg/dL (ref 8.6–10.4)
Chloride: 106 mmol/L (ref 98–110)
Creat: 1.15 mg/dL — ABNORMAL HIGH (ref 0.60–0.95)
Globulin: 2.6 g/dL (ref 1.9–3.7)
Glucose, Bld: 90 mg/dL (ref 65–99)
Potassium: 5.1 mmol/L (ref 3.5–5.3)
Sodium: 140 mmol/L (ref 135–146)
Total Bilirubin: 0.4 mg/dL (ref 0.2–1.2)
Total Protein: 6.8 g/dL (ref 6.1–8.1)
eGFR: 46 mL/min/1.73m2 — ABNORMAL LOW

## 2024-06-18 LAB — CBC WITH DIFFERENTIAL/PLATELET
Absolute Lymphocytes: 2210 {cells}/uL (ref 850–3900)
Absolute Monocytes: 756 {cells}/uL (ref 200–950)
Basophils Absolute: 79 {cells}/uL (ref 0–200)
Basophils Relative: 1.1 %
Eosinophils Absolute: 410 {cells}/uL (ref 15–500)
Eosinophils Relative: 5.7 %
HCT: 37.9 % (ref 35.9–46.0)
Hemoglobin: 12.3 g/dL (ref 11.7–15.5)
MCH: 30.5 pg (ref 27.0–33.0)
MCHC: 32.5 g/dL (ref 31.6–35.4)
MCV: 94 fL (ref 81.4–101.7)
MPV: 9.8 fL (ref 7.5–12.5)
Monocytes Relative: 10.5 %
Neutro Abs: 3744 {cells}/uL (ref 1500–7800)
Neutrophils Relative %: 52 %
Platelets: 259 Thousand/uL (ref 140–400)
RBC: 4.03 Million/uL (ref 3.80–5.10)
RDW: 12.4 % (ref 11.0–15.0)
Total Lymphocyte: 30.7 %
WBC: 7.2 Thousand/uL (ref 3.8–10.8)

## 2024-06-18 LAB — TSH: TSH: 2.33 m[IU]/L (ref 0.40–4.50)

## 2024-06-18 LAB — LIPID PANEL
Cholesterol: 200 mg/dL — ABNORMAL HIGH
HDL: 54 mg/dL
LDL Cholesterol (Calc): 126 mg/dL — ABNORMAL HIGH
Non-HDL Cholesterol (Calc): 146 mg/dL — ABNORMAL HIGH
Total CHOL/HDL Ratio: 3.7 (calc)
Triglycerides: 96 mg/dL

## 2024-06-22 ENCOUNTER — Ambulatory Visit: Payer: Self-pay | Admitting: Internal Medicine

## 2024-06-22 ENCOUNTER — Telehealth: Payer: Self-pay

## 2024-06-22 NOTE — Telephone Encounter (Signed)
 Evenity  VOB initiated via [email or web address]  Last Evenity  inj: 05/29/24 Next Evenity  inj DUE: 06/29/24

## 2024-06-23 ENCOUNTER — Other Ambulatory Visit (HOSPITAL_COMMUNITY): Payer: Self-pay

## 2024-06-23 NOTE — Telephone Encounter (Signed)
 Pt ready for scheduling for EVENITY1/ on or after : 06/29/24  Option# 1 Buy/Bill (Office supplied medication)  Out-of-pocket cost due at time of  office visit: $0  Number of injection/visits approved: ---  Primary: MEDICARE Evenity  co-insurance: 0% Admin fee co-insurance: 0%  Secondary: AARP-MEDSUP Evenity  co-insurance: covers the Medicare Part B deductible, co-insurance and 100% of the excess charges Admin fee co-insurance:   Medical Benefit Details: Date Benefits were checked: 06/22/24 Deductible: $0 Met of $283 Required/ Coinsurance: 0%/ Admin Fee: 0%  Prior Auth: N/A PA# Expiration Date:   # of doses approved: ------------------------------------------------------------------------- Option# 2- Med Obtained from pharmacy  Pharmacy benefit: Copay $--- (Paid to pharmacy) Admin Fee: --- (Pay at clinic)  Prior Auth: --- PA# Expiration Date:   # of doses approved:  If patient wants fill through the pharmacy benefit please send prescription to: ---, and include estimated need by date in rx notes. Pharmacy will ship medication directly to the office.  Patient NOT eligible for Evenity  Copay Card. Copay Card can make patient's cost as little as $25. Link to apply: [email or web address]://www.[email or web address]   This summary of benefits is an estimation of the patient's out-of-pocket cost. Exact cost may very based on individual plan coverage.

## 2024-06-23 NOTE — Telephone Encounter (Signed)
 Nancy [name]

## 2024-06-28 ENCOUNTER — Other Ambulatory Visit: Payer: Self-pay | Admitting: Internal Medicine

## 2024-06-29 ENCOUNTER — Ambulatory Visit

## 2024-06-29 DIAGNOSIS — M810 Age-related osteoporosis without current pathological fracture: Secondary | ICD-10-CM | POA: Diagnosis not present

## 2024-06-29 MED ORDER — ROMOSOZUMAB-AQQG 105 MG/1.17ML ~~LOC~~ SOSY
210.0000 mg | PREFILLED_SYRINGE | Freq: Once | SUBCUTANEOUS | Status: AC
  Administered 2024-06-29: 210 mg via SUBCUTANEOUS

## 2024-06-29 NOTE — Progress Notes (Signed)
 Patient is in office today for a nurse visit for Evenity  210mg . Patient injecttion was given in the L thigh SubQ. Patient tolerated injection well.

## 2024-07-10 ENCOUNTER — Ambulatory Visit: Payer: Medicare Other

## 2024-07-10 VITALS — Ht 60.5 in | Wt 161.0 lb

## 2024-07-10 DIAGNOSIS — Z0000 Encounter for general adult medical examination without abnormal findings: Secondary | ICD-10-CM

## 2024-07-10 NOTE — Patient Instructions (Addendum)
 Ms. Laidlaw,  Thank you for taking the time for your Medicare Wellness Visit. I appreciate your continued commitment to your health goals. Please review the care plan we discussed, and feel free to reach out if I can assist you further.  Please note that Annual Wellness Visits do not include a physical exam. Some assessments may be limited, especially if the visit was conducted virtually. If needed, we may recommend an in-person follow-up with your provider.  Ongoing Care Seeing your primary care provider every 3 to 6 months helps us  monitor your health and provide consistent, personalized care.   Referrals If a referral was made during today's visit and you haven't received any updates within two weeks, please contact the referred provider directly to check on the status.  Recommended Screenings:  Health Maintenance  Topic Date Due   COVID-19 Vaccine (6 - 2025-26 season) 02/17/2024   Medicare Annual Wellness Visit  07/10/2025   DTaP/Tdap/Td vaccine (4 - Td or Tdap) 01/16/2030   Pneumococcal Vaccine for age over 81  Completed   Flu Shot  Completed   Osteoporosis screening with Bone Density Scan  Completed   Zoster (Shingles) Vaccine  Completed   Meningitis B Vaccine  Aged Out       07/10/2024    3:38 PM  Advanced Directives  Does Patient Have a Medical Advance Directive? Yes  Type of Estate Agent of [place];Living will  Does patient want to make changes to medical advance directive? No - Patient declined  Copy of Healthcare Power of Attorney in Chart? No - copy requested    Vision: Annual vision screenings are recommended for early detection of glaucoma, cataracts, and diabetic retinopathy. These exams can also reveal signs of chronic conditions such as diabetes and high blood pressure.  Dental: Annual dental screenings help detect early signs of oral cancer, gum disease, and other conditions linked to overall health, including heart disease and  diabetes.  Please see the attached documents for additional preventive care recommendations.

## 2024-07-10 NOTE — Progress Notes (Signed)
 "  Chief Complaint  Patient presents with   Medicare Wellness     Subjective:   Nancy [name] is a 89 y.o. female who presents for a Medicare Annual Wellness Visit.  Visit info / Clinical Intake: Medicare Wellness Visit Type:: Subsequent Annual Wellness Visit Persons participating in visit and providing information:: patient Medicare Wellness Visit Mode:: Telephone If telephone:: video declined Since this visit was completed virtually, some vitals may be partially provided or unavailable. Missing vitals are due to the limitations of the virtual format.: Documented vitals are patient reported If Telephone or Video please confirm:: I connected with patient using audio/video enable telemedicine. I verified patient identity with two identifiers, discussed telehealth limitations, and patient agreed to proceed. Patient Location:: Home Provider Location:: Office Interpreter Needed?: No Pre-visit prep was completed: yes AWV questionnaire completed by patient prior to visit?: yes Date:: 07/06/24 Living arrangements:: (!) lives alone Patient's Overall Health Status Rating: very good Typical amount of pain: none Does pain affect daily life?: no Are you currently prescribed opioids?: no  Dietary Habits and Nutritional Risks How many meals a day?: 3 Eats fruit and vegetables daily?: (!) no Most meals are obtained by: preparing own meals In the last 2 weeks, have you had any of the following?: none Diabetic:: no  Functional Status Activities of Daily Living (to include ambulation/medication): Independent Ambulation: Independent with device- listed below Home Assistive Devices/Equipment: Eyeglasses Medication Administration: Independent Home Management (perform basic housework or laundry): Independent Manage your own finances?: yes Primary transportation is: driving Concerns about vision?: no *vision screening is required for WTM* Concerns about hearing?: no  Fall Screening Falls  in the past year?: 0 Number of falls in past year: 0 Was there an injury with Fall?: 0 Fall Risk Category Calculator: 0 Patient Fall Risk Level: Low Fall Risk  Fall Risk Patient at Risk for Falls Due to: No Fall Risks Fall risk Follow up: Falls evaluation completed  Home and Transportation Safety: All rugs have non-skid backing?: N/A, no rugs All stairs or steps have railings?: yes Grab bars in the bathtub or shower?: yes Have non-skid surface in bathtub or shower?: yes Good home lighting?: yes Regular seat belt use?: yes Hospital stays in the last year:: no  Cognitive Assessment Difficulty concentrating, remembering, or making decisions? : no Will 6CIT or Mini Cog be Completed: yes What year is it?: 0 points What month is it?: 0 points Give patient an address phrase to remember (5 components): [address] [place]  About what time is it?: 0 points Count backwards from 20 to 1: 0 points Say the months of the year in reverse: 0 points Repeat the address phrase from earlier: 0 points 6 CIT Score: 0 points  Advance Directives (For Healthcare) Does Patient Have a Medical Advance Directive?: Yes Does patient want to make changes to medical advance directive?: No - Patient declined Type of Advance Directive: Healthcare Power of [place]; Living will Copy of Healthcare Power of Attorney in Chart?: No - copy requested Copy of Living Will in Chart?: No - copy requested  Reviewed/Updated  Reviewed/Updated: Reviewed All (Medical, Surgical, Family, Medications, Allergies, Care Teams, Patient Goals)    Allergies (verified) Norco [hydrocodone-acetaminophen ], Penicillins, and Sulfamethoxazole   Current Medications (verified) Outpatient Encounter Medications as of 07/10/2024  Medication Sig   benzonatate  (TESSALON ) 100 MG capsule Take 1 capsule (100 mg total) by mouth 2 (two) times daily as needed for cough. (Patient not taking: Reported on 07/10/2024)   CALCIUM PO Take 600  mg  by mouth daily.   cholecalciferol (VITAMIN D3) 25 MCG (1000 UT) tablet Take 2,000 Units by mouth daily.    eszopiclone  3 MG TABS Take 1 tablet (3 mg total) by mouth at bedtime as needed. Take immediately before bedtime   levothyroxine  (SYNTHROID ) 50 MCG tablet TAKE 1 TABLET BY MOUTH DAILY   loratadine (CLARITIN) 10 MG tablet Take 10 mg by mouth as needed.   Multiple Vitamins-Minerals (ZINC PO) Take 50 mg by mouth.   Omega-3 Fatty Acids (FISH OIL  PO) Take 1,200 mg by mouth.   zinc gluconate 50 MG tablet Take 50 mg by mouth daily.   Facility-Administered Encounter Medications as of 07/10/2024  Medication   0.9 %  sodium chloride  infusion   [START ON 10/27/2024] Romosozumab -aqqg (EVENITY ) 105 MG/1. injection 210 mg    History: Past Medical History:  Diagnosis Date   Allergy    Arthritis right knee   Avascular necrosis (HCC)    RIGHT SHOULDER   Blood transfusion without reported diagnosis    With shoulder surgery, 2019   Chronic cough    saw several specialists per her report, NONE NOW   Closed 4-part fracture of proximal end of right humerus, with malunion, subsequent encounter with AVN 03/18/2018   Hypothyroidism    Osteoporosis    Sees Dr. Orpha at [facility], on prolia    PONV (postoperative nausea and vomiting)    severe post op nausea and vomiting   Postoperative anemia due to acute blood loss 03/19/2018   Past Surgical History:  Procedure Laterality Date   ABDOMINAL HYSTERECTOMY  1975   PARTIAL; had ovarian cysts. no cancer concerns.   APPENDECTOMY     CATARACT EXTRACTION Right    08/2020   JOINT REPLACEMENT  2019   right shoulder replacement   LAPAROSCOPY     removal of ovaries   OOPHORECTOMY Bilateral 2007   removal growth thyroid   1998   REVERSE SHOULDER ARTHROPLASTY Right 03/18/2018   Procedure: REVERSE RIGHT SHOULDER ARTHROPLASTY;  Surgeon: Josefina Chew, MD;  Location: [facility];  Service: Orthopedics;  Laterality: Right;   TONSILLECTOMY     TRIGGER  FINGER RELEASE Left 04/06/2019   Procedure: DUPUYTREN 'S AND CONTRACTURE RELEASE LEFT FOURTH FINGER;  Surgeon: Marcus Lung, MD;  Location: [facility];  Service: Plastics;  Laterality: Left;   Family History  Problem Relation Age of Onset   Dementia Mother    Colon cancer Mother 62   Parkinsonism Father    Hypertension Father    Colon cancer Sister 44   Hypertension Sister    Stroke Sister    Osteoporosis Other    Kidney disease Daughter    Stomach cancer Neg Hx    Colon polyps Neg Hx    Esophageal cancer Neg Hx    Rectal cancer Neg Hx    Social History   Occupational History   Occupation: retired  Tobacco Use   Smoking status: Never   Smokeless tobacco: Never  Vaping Use   Vaping status: Never Used  Substance and Sexual Activity   Alcohol use: Never   Drug use: Never   Sexual activity: Not Currently   Tobacco Counseling Counseling given: No  SDOH Screenings   Food Insecurity: No Food Insecurity (07/10/2024)  Housing: Low Risk (07/10/2024)  Transportation Needs: No Transportation Needs (07/10/2024)  Utilities: Not At Risk (07/10/2024)  Alcohol Screen: Low Risk (07/05/2023)  Depression (PHQ2-9): Low Risk (07/10/2024)  Financial Resource Strain: Low Risk (06/13/2024)  Physical Activity: Inactive (  07/10/2024)  Social Connections: Moderately Integrated (07/10/2024)  Stress: No Stress Concern Present (07/10/2024)  Tobacco Use: Low Risk (07/10/2024)  Health Literacy: Adequate Health Literacy (07/10/2024)   See flowsheets for full screening details  Depression Screen PHQ 2 & 9 Depression Scale- Over the past 2 weeks, how often have you been bothered by any of the following problems? Little interest or pleasure in doing things: 0 Feeling down, depressed, or hopeless (PHQ Adolescent also includes...irritable): 0 PHQ-2 Total Score: 0     Goals Addressed               This Visit's Progress     Increase physical activity (pt-stated)        Stay  Active!             Objective:    Today's Vitals   07/10/24 1536  Weight: 161 lb (73 kg)  Height: 5' 0.5 (1.537 m)   Body mass index is 30.93 kg/m.  Hearing/Vision screen Hearing Screening - Comments:: Denies hearing difficulties   Vision Screening - Comments:: Wears rx glasses - up to date with routine eye exams with  Nancy [name] Immunizations and Health Maintenance Health Maintenance  Topic Date Due   COVID-19 Vaccine (6 - 2025-26 season) 02/17/2024   Medicare Annual Wellness (AWV)  07/10/2025   DTaP/Tdap/Td (4 - Td or Tdap) 01/16/2030   Pneumococcal Vaccine: 50+ Years  Completed   Influenza Vaccine  Completed   Bone Density Scan  Completed   Zoster Vaccines- Shingrix  Completed   Meningococcal B Vaccine  Aged Out        Assessment/Plan:  This is a routine wellness examination for Nancy [name].  Patient Care Team: Theophilus Andrews, Tully GRADE, MD as PCP - General (Internal Medicine) Sarrah Browning, MD as Consulting Physician (Obstetrics and Gynecology) Beverley Toribio FALCON, MD (Inactive) as Consulting Physician (Orthopedic Surgery) Orpha Asberry RAMAN, MD as Consulting Physician (Sports Medicine) Orpha Asberry RAMAN, MD as Consulting Physician (Sports Medicine) Josefina Chew, MD as Consulting Physician (Orthopedic Surgery)  I have personally reviewed and noted the following in the patients chart:   Medical and social history Use of alcohol, tobacco or illicit drugs  Current medications and supplements including opioid prescriptions. Functional ability and status Nutritional status Physical activity Advanced directives List of other physicians Hospitalizations, surgeries, and ER visits in previous 12 months Vitals Screenings to include cognitive, depression, and falls Referrals and appointments  No orders of the defined types were placed in this encounter.  In addition, I have reviewed and discussed with patient certain preventive protocols, quality  metrics, and best practice recommendations. A written personalized care plan for preventive services as well as general preventive health recommendations were provided to patient.   Nancy LELON Blush, LPN   8/76/[id number]   Return in 53 weeks (on 07/16/2025).  After Visit Summary: (MyChart) Due to this being a telephonic visit, the after visit summary with patients personalized plan was offered to patient via MyChart   Nurse Notes: No voiced or noted concerns at this time "

## 2024-07-29 ENCOUNTER — Ambulatory Visit

## 2025-07-16 ENCOUNTER — Ambulatory Visit
